# Patient Record
Sex: Male | Born: 1990
Health system: Southern US, Community
[De-identification: ages and names within clinical notes are randomized; demographics above are authoritative.]

## PROBLEM LIST (undated history)

## (undated) DIAGNOSIS — W3400XA Accidental discharge from unspecified firearms or gun, initial encounter: Secondary | ICD-10-CM

## (undated) DIAGNOSIS — L0292 Furuncle, unspecified: Secondary | ICD-10-CM

---

## 2016-04-18 ENCOUNTER — Encounter: Payer: Self-pay | Admitting: Pediatric Intensive Care

## 2016-04-18 DIAGNOSIS — Z5900 Homelessness unspecified: Secondary | ICD-10-CM

## 2016-05-10 NOTE — Congregational Nurse Program (Signed)
Congregational Nurse Program Note  Date of Encounter: 04/18/2016  Past Medical History: No past medical history on file.  Encounter Details:     CNP Questionnaire - 04/18/16 1125      Patient Demographics   Is this a new or existing patient? New   Patient is considered a/an Not Applicable   Race African-American/Black     Patient Assistance   Location of Patient Assistance [facility]   Patient's financial/insurance status Low Income;[place] [facility]/Care Connects   Uninsured Patient Yes   Interventions Not Applicable   Patient referred to apply for the following financial assistance Not Applicable   Food insecurities addressed Not Applicable   Transportation assistance No   Assistance securing medications No   Educational health offerings Navigating the healthcare system     Encounter Details   Primary purpose of visit Navigating the Healthcare System   Was an Emergency Department visit averted? Not Applicable   Does patient have a medical provider? Yes   Patient referred to Not Applicable   Was a mental health screening completed? (GAINS tool) No   Does patient have dental issues? No   Does patient have vision issues? No   Does your patient have an abnormal blood pressure today? No   Since previous encounter, have you referred patient for abnormal blood pressure that resulted in a new diagnosis or medication change? No   Does your patient have an abnormal blood glucose today? No   Since previous encounter, have you referred patient for abnormal blood glucose that resulted in a new diagnosis or medication change? No   Was there a life-saving intervention made? No     Client requests BP check.

## 2017-01-13 ENCOUNTER — Emergency Department (HOSPITAL_COMMUNITY)
Admission: EM | Admit: 2017-01-13 | Discharge: 2017-01-13 | Disposition: A | Discharge status: Home / Self Care | Payer: Self-pay | Attending: Emergency Medicine | Admitting: Emergency Medicine

## 2017-01-13 ENCOUNTER — Encounter (HOSPITAL_COMMUNITY): Payer: Self-pay | Admitting: Emergency Medicine

## 2017-01-13 ENCOUNTER — Ambulatory Visit (HOSPITAL_COMMUNITY): Admission: EM | Admit: 2017-01-13 | Discharge: 2017-01-13 | Discharge status: Against Medical Advice | Payer: Self-pay

## 2017-01-13 DIAGNOSIS — F17200 Nicotine dependence, unspecified, uncomplicated: Secondary | ICD-10-CM | POA: Insufficient documentation

## 2017-01-13 DIAGNOSIS — R369 Urethral discharge, unspecified: Secondary | ICD-10-CM

## 2017-01-13 DIAGNOSIS — R300 Dysuria: Secondary | ICD-10-CM | POA: Insufficient documentation

## 2017-01-13 LAB — URINALYSIS, ROUTINE W REFLEX MICROSCOPIC
Bilirubin Urine: NEGATIVE
GLUCOSE, UA: NEGATIVE mg/dL
Ketones, ur: NEGATIVE mg/dL
NITRITE: NEGATIVE
PROTEIN: NEGATIVE mg/dL
SPECIFIC GRAVITY, URINE: 1.025 (ref 1.005–1.030)
pH: 5 (ref 5.0–8.0)

## 2017-01-13 MED ORDER — LIDOCAINE HCL (PF) 1 % IJ SOLN
5.0000 mL | Freq: Once | INTRAMUSCULAR | Status: AC
  Administered 2017-01-13: 5 mL
  Filled 2017-01-13: qty 5

## 2017-01-13 MED ORDER — AZITHROMYCIN 250 MG PO TABS
1000.0000 mg | ORAL_TABLET | Freq: Once | ORAL | Status: AC
  Administered 2017-01-13: 1000 mg via ORAL
  Filled 2017-01-13: qty 4

## 2017-01-13 MED ORDER — CEFTRIAXONE SODIUM 250 MG IJ SOLR
250.0000 mg | Freq: Once | INTRAMUSCULAR | Status: AC
  Administered 2017-01-13: 250 mg via INTRAMUSCULAR
  Filled 2017-01-13: qty 250
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
## 2017-01-13 NOTE — Discharge Instructions (Signed)
You have been seen today in the Emergency Department (ED) for penis pain and discharge. Your workup today shows that you had one or more sexually transmitted infections (STIs).  You have been treated with a one time dose medication for both of these conditions.  Please have your partner tested for STD's and do not resume sexual activity until you and your partner have confirmed negative test results or have both been treated.  Please follow up with your doctor as soon as possible regarding today?s ED visit and your symptoms.   Return to the ED if your pain worsens, you develop a fever, or for any other symptoms that concern you.

## 2017-01-13 NOTE — ED Triage Notes (Signed)
Pt c/o penile discharge, yellow x 2 days. +dysuria. Lower abd pain began 2 days ago.

## 2017-01-15 LAB — GC/CHLAMYDIA PROBE AMP (~~LOC~~) NOT AT ARMC
Chlamydia: NEGATIVE
Neisseria Gonorrhea: POSITIVE — AB

## 2017-01-22 ENCOUNTER — Emergency Department (HOSPITAL_COMMUNITY)
Admission: EM | Admit: 2017-01-22 | Discharge: 2017-01-22 | Disposition: A | Discharge status: Home / Self Care | Payer: Self-pay | Attending: Emergency Medicine | Admitting: Emergency Medicine

## 2017-01-22 DIAGNOSIS — Y929 Unspecified place or not applicable: Secondary | ICD-10-CM | POA: Insufficient documentation

## 2017-01-22 DIAGNOSIS — F17200 Nicotine dependence, unspecified, uncomplicated: Secondary | ICD-10-CM | POA: Insufficient documentation

## 2017-01-22 DIAGNOSIS — Y939 Activity, unspecified: Secondary | ICD-10-CM | POA: Insufficient documentation

## 2017-01-22 DIAGNOSIS — Y999 Unspecified external cause status: Secondary | ICD-10-CM | POA: Insufficient documentation

## 2017-01-22 DIAGNOSIS — S0181XA Laceration without foreign body of other part of head, initial encounter: Secondary | ICD-10-CM | POA: Insufficient documentation

## 2017-01-22 MED ORDER — LIDOCAINE-EPINEPHRINE (PF) 2 %-1:200000 IJ SOLN: 20.0000 mL | Freq: Once | INTRAMUSCULAR | Status: DC

## 2017-01-22 MED ORDER — BACITRACIN ZINC 500 UNIT/GM EX OINT: TOPICAL_OINTMENT | Freq: Two times a day (BID) | CUTANEOUS | Status: DC

## 2017-01-22 NOTE — ED Notes (Signed)
PA at the bedside.
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
## 2017-01-22 NOTE — Discharge Instructions (Signed)
If you develop any vision changes, headache, vomiting please return to the ED.   WOUND CARE Please have your stitches/staples removed in 3-5 or sooner if you have concerns. You may do this at any available urgent care or at your primary care doctor's office.  Keep area clean and dry for 24 hours. Do not remove bandage, if applied.  After 24 hours, remove bandage and wash wound gently with mild soap and warm water. Reapply a new bandage after cleaning wound, if directed.  Continue daily cleansing with soap and water until stitches/staples are removed.  Do not apply any ointments or creams to the wound while stitches/staples are in place, as this may cause delayed healing.  Seek medical careif you experience any of the following signs of infection: Swelling, redness, pus drainage, streaking, fever >101.0 F  Seek care if you experience excessive bleeding that does not stop after 15-20 minutes of constant, firm pressure.
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
## 2017-01-29 ENCOUNTER — Emergency Department (HOSPITAL_COMMUNITY)
Admission: EM | Admit: 2017-01-29 | Discharge: 2017-01-30 | Disposition: A | Discharge status: Other Acute Inpatient Hospital | Payer: Self-pay | Attending: Emergency Medicine | Admitting: Emergency Medicine

## 2017-01-29 ENCOUNTER — Encounter (HOSPITAL_COMMUNITY): Payer: Self-pay

## 2017-01-29 DIAGNOSIS — F1410 Cocaine abuse, uncomplicated: Secondary | ICD-10-CM | POA: Insufficient documentation

## 2017-01-29 DIAGNOSIS — F17200 Nicotine dependence, unspecified, uncomplicated: Secondary | ICD-10-CM | POA: Insufficient documentation

## 2017-01-29 DIAGNOSIS — F1210 Cannabis abuse, uncomplicated: Secondary | ICD-10-CM | POA: Insufficient documentation

## 2017-01-29 DIAGNOSIS — R45851 Suicidal ideations: Secondary | ICD-10-CM

## 2017-01-29 DIAGNOSIS — F329 Major depressive disorder, single episode, unspecified: Secondary | ICD-10-CM | POA: Insufficient documentation

## 2017-01-29 LAB — COMPREHENSIVE METABOLIC PANEL
ALBUMIN: 4 g/dL (ref 3.5–5.0)
ALT: 12 U/L — ABNORMAL LOW (ref 17–63)
AST: 25 U/L (ref 15–41)
Alkaline Phosphatase: 69 U/L (ref 38–126)
Anion gap: 8 (ref 5–15)
BUN: 9 mg/dL (ref 6–20)
CO2: 28 mmol/L (ref 22–32)
Calcium: 9.4 mg/dL (ref 8.9–10.3)
Chloride: 102 mmol/L (ref 101–111)
Creatinine, Ser: 1.09 mg/dL (ref 0.61–1.24)
GFR calc Af Amer: >60 mL/min (ref >60)
GFR calc non Af Amer: >60 mL/min (ref >60)
GLUCOSE: 71 mg/dL (ref 65–99)
POTASSIUM: 4 mmol/L (ref 3.5–5.1)
SODIUM: 138 mmol/L (ref 135–145)
TOTAL PROTEIN: 6.7 g/dL (ref 6.5–8.1)
Total Bilirubin: 1.2 mg/dL (ref 0.3–1.2)

## 2017-01-29 LAB — CBC
HEMATOCRIT: 47.2 % (ref 39.0–52.0)
HEMOGLOBIN: 16.1 g/dL (ref 13.0–17.0)
MCH: 31.9 pg (ref 26.0–34.0)
MCHC: 34.1 g/dL (ref 30.0–36.0)
MCV: 93.7 fL (ref 78.0–100.0)
Platelets: 275 10*3/uL (ref 150–400)
RBC: 5.04 MIL/uL (ref 4.22–5.81)
RDW: 13.2 % (ref 11.5–15.5)
WBC: 8.5 10*3/uL (ref 4.0–10.5)

## 2017-01-29 LAB — SALICYLATE LEVEL

## 2017-01-29 LAB — RAPID URINE DRUG SCREEN, HOSP PERFORMED
Amphetamines: NOT DETECTED
BENZODIAZEPINES: NOT DETECTED
Barbiturates: NOT DETECTED
COCAINE: POSITIVE — AB
OPIATES: NOT DETECTED
Tetrahydrocannabinol: POSITIVE — AB

## 2017-01-29 LAB — ACETAMINOPHEN LEVEL

## 2017-01-29 LAB — ETHANOL: Alcohol, Ethyl (B): <5 mg/dL (ref <5)

## 2017-01-29 MED ORDER — ACETAMINOPHEN 325 MG PO TABS: 650.0000 mg | ORAL_TABLET | ORAL | Status: DC | PRN

## 2017-01-29 MED ORDER — ONDANSETRON HCL 4 MG PO TABS
4.0000 mg | ORAL_TABLET | Freq: Three times a day (TID) | ORAL | Status: DC | PRN
Start: 2017-01-29 — End: 2017-01-30

## 2017-01-29 MED ORDER — IBUPROFEN 400 MG PO TABS: 600.0000 mg | ORAL_TABLET | Freq: Three times a day (TID) | ORAL | Status: DC | PRN

## 2017-01-29 NOTE — BH Assessment (Addendum)
Tele Assessment Note   Daniel [name] is an 26 y.o. male who presents to the ED voluntarily. Pt reports he has been using drugs daily for the past several weeks and states he has been feeling suicidal and hopeless. Pt denies that he has a current plan and states he has never attempted suicide in the past but he often thinks about it. Pt reports the last time he "thought about it was today."   Pt reports he has been using cocaine and marijuana since the age of 26 "off and on" but states he recently began using drugs everyday. Pt reports his mother passed away in 2005 and his grandmother recently passed away. Pt stated his grandmother "meant a lot to him." Pt's speech was slurred and at times inaudible throughout the assessment. Pt stated "I'm sorry. I'm just tired. I haven't been sleeping." [facility] and stated he hears "voices just talking." Pt states this occurs "when he is not high." Pt reports he feels that he does not have a strong support system but states he has "good friends who are positive influences."  Per Donell SievertSpencer Simon, PA pt meets criteria for inpt treatment.   Diagnosis: Cocaine Use D/O; Cannabis Use D/O; Substance Induced Mood D/O  Past Medical History: History reviewed. No pertinent past medical history.  History reviewed. No pertinent surgical history.  Family History: No family history on file.  Social History:  reports that he has been smoking.  He has never used smokeless tobacco. He reports that he drinks alcohol. He reports that he uses drugs, including Marijuana and Cocaine.  Additional Social History:  Alcohol / Drug Use Pain Medications: See MAR Prescriptions: See MAR Over the Counter: See MAR History of alcohol / drug use?: Yes Substance #1 Name of Substance 1: Cocaine 1 - Age of First Use: 15 1 - Amount (size/oz): pt stated "a lot" 1 - Frequency: daily 1 - Duration: ongoing 1 - Last Use / Amount: 01/28/17 Substance #2 Name of Substance 2:  Marijuana  2 - Age of First Use: 15 2 - Amount (size/oz): "a lot" 2 - Frequency: daily 2 - Duration: ongoing 2 - Last Use / Amount: 01/28/17  CIWA: CIWA-Ar BP: 137/66 Pulse Rate: 75 COWS:    PATIENT STRENGTHS: (choose at least two) Average or above average intelligence Capable of independent living Communication skills Motivation for treatment/growth  Allergies: No Known Allergies  Home Medications:  (Not in a hospital admission)  OB/GYN Status:  No LMP for male patient.  General Assessment Data [facility] Assessment: In system Is this a Tele or Face-to-Face Assessment?: Tele Assessment Is this an Initial Assessment or a Re-assessment for this encounter?: Initial Assessment Marital status: Single Is patient pregnant?: No Pregnancy Status: No Living Arrangements: Alone Can pt return to current living arrangement?: Yes Admission Status: Voluntary Is patient capable of signing voluntary admission?: Yes Referral Source: Self/Family/Friend Insurance type: none     Crisis Care Plan Living Arrangements: Alone Name of Psychiatrist: none Name of Therapist: none  Education Status Is patient currently in school?: No Highest grade of school patient has completed: GED  Risk to self with the past 6 months Suicidal Ideation: Yes-Currently Present Has patient been a risk to self within the past 6 months prior to admission? : No Suicidal Intent: No Has patient had any suicidal intent within the past 6 months prior to admission? : No Is patient at risk for suicide?: Yes Suicidal Plan?: No Has patient had  any suicidal plan within the past 6 months prior to admission? : No Access to Means: No What has been your use of drugs/alcohol within the last 12 months?: reports to daily cocaine and marijuana use  Previous Attempts/Gestures: No Triggers for Past Attempts: None known Intentional Self Injurious Behavior: None Family Suicide History: No Recent  stressful life event(s): Other (Comment), Loss (Comment) (increased SA, grandmother died, mother died) Persecutory voices/beliefs?: No Depression: Yes Depression Symptoms: Insomnia, Guilt, Loss of interest in usual pleasures, Feeling worthless/self pity, Feeling angry/irritable Substance abuse history and/or treatment for substance abuse?: Yes Suicide prevention information given to non-admitted patients: Not applicable  Risk to Others within the past 6 months Homicidal Ideation: No Does patient have any lifetime risk of violence toward others beyond the six months prior to admission? : No Thoughts of Harm to Others: No Current Homicidal Intent: No Current Homicidal Plan: No Access to Homicidal Means: No History of harm to others?: No Assessment of Violence: None Noted Does patient have access to weapons?: No Criminal Charges Pending?: No Does patient have a court date: No Is patient on probation?: No  Psychosis Hallucinations: Auditory Delusions: None noted  Mental Status Report Appearance/Hygiene: In scrubs Eye Contact: Poor Motor Activity: Freedom of movement Speech: Logical/coherent, Soft Level of Consciousness: Quiet/awake Mood: Depressed, Guilty, Helpless Affect: Depressed Anxiety Level: Minimal Thought Processes: Relevant, Coherent Judgement: Partial Orientation: Person, Place, Situation, Time, Appropriate for developmental age Obsessive Compulsive Thoughts/Behaviors: None  Cognitive Functioning Concentration: Normal Memory: Recent Intact, Remote Intact IQ: Average Insight: Fair Impulse Control: Fair Appetite: Good Sleep: Decreased Total Hours of Sleep: 4 Vegetative Symptoms: None  ADLScreening [facility] Assessment Services) Patient's cognitive ability adequate to safely complete daily activities?: Yes Patient able to express need for assistance with ADLs?: Yes Independently performs ADLs?: Yes (appropriate for developmental age)  Prior Inpatient  Therapy Prior Inpatient Therapy: No  Prior Outpatient Therapy Prior Outpatient Therapy: No Does patient have an ACCT team?: No Does patient have Intensive In-House Services?  : No Does patient have [facility] services? : No Does patient have P4CC services?: No  ADL Screening (condition at time of admission) Patient's cognitive ability adequate to safely complete daily activities?: Yes Is the patient deaf or have difficulty hearing?: No Does the patient have difficulty seeing, even when wearing glasses/contacts?: No Does the patient have difficulty concentrating, remembering, or making decisions?: No Patient able to express need for assistance with ADLs?: Yes Does the patient have difficulty dressing or bathing?: No Independently performs ADLs?: Yes (appropriate for developmental age) Does the patient have difficulty walking or climbing stairs?: No Weakness of Legs: None Weakness of Arms/Hands: None  Home Assistive Devices/Equipment Home Assistive Devices/Equipment: None    Abuse/Neglect Assessment (Assessment to be complete while patient is alone) Physical Abuse: Denies Verbal Abuse: Denies Sexual Abuse: Denies Exploitation of patient/patient's resources: Denies Self-Neglect: Denies     Merchant navy officer (For Healthcare) Does Patient Have a Medical Advance Directive?: No Would patient like information on creating a medical advance directive?: No - Patient declined    Additional Information 1:1 In Past 12 Months?: No CIRT Risk: No Elopement Risk: No Does patient have medical clearance?: Yes     Disposition:  Disposition Initial Assessment Completed for this Encounter: Yes Disposition of Patient: Inpatient treatment program Type of inpatient treatment program: Adult (per Donell Sievert, PA)  [name] 01/29/2017 9:38 PM

## 2017-01-29 NOTE — ED Provider Notes (Signed)
[facility]-EMERGENCY DEPT Provider Note   CSN: [id number] Arrival date & time: 01/29/17  1431   By signing my name below, I, Daniel [name], attest that this documentation has been prepared under the direction and in the presence of Daniel [name], Daniel Bonitoana Duo, MD. Electronically signed, Daniel [name], ED Scribe. 01/29/17. 6:14 PM.   History   Chief Complaint Chief Complaint  Patient presents with  . Suicidal   The history is provided by the patient and medical records. No language interpreter was used.    Daniel [name] is a 26 y.o. male with h/o polysubstance abuse presenting to the Emergency Department concerning suicidal ideations x > 1 week. He reports plan to strangle himself. Friend present in triage stated the pt expressed SI today via text message. Pt also expresses concern for abuse of marijuana and cocaine as well as occasional molly and meth use. He states he has used these drugs intermittently x "a few years". He states he last used marijuana and alcohol last night. Pt states he drinks a few beers daily. He states he does not have withdrawals when he does not drink alcohol. No h/o detox or psych hospitalizations. He states [facility] his [facility] after neglecting to attend school. No hallucinations, HI, cough, diarrhea, headache or fever. No other complaints at this time.   History reviewed. No pertinent past medical history.  There are no active problems to display for this patient.   History reviewed. No pertinent surgical history.     Home Medications    Prior to Admission medications   Not on File    Family History No family history on file.  Social History Social History  Substance Use Topics  . Smoking status: Current Every Day Smoker  . Smokeless tobacco: Never Used  . Alcohol use Yes     Comment: occ     Allergies   Patient has no known allergies.   Review of Systems Review of Systems  Constitutional: Negative for fever.  Respiratory:  Negative for cough.   Neurological: Negative for headaches.  Psychiatric/Behavioral: Positive for suicidal ideas. Negative for hallucinations.  All other systems reviewed and are negative.    Physical Exam Updated Vital Signs BP (!) 138/92 (BP Location: Right Arm)   Pulse 73   Temp 97.9 F (36.6 C) (Oral)   Resp 16   Ht 5\' 11"  (1.803 m)   Wt 155 lb (70.3 kg)   SpO2 100%   BMI 21.62 kg/m   Physical Exam Physical Exam  Nursing note and vitals reviewed. Constitutional: Well developed, well nourished, non-toxic, and in no acute distress Head: Normocephalic and atraumatic.  Mouth/Throat: Oropharynx is clear and moist.  Neck: Normal range of motion. Neck supple.  Cardiovascular: Normal rate and regular rhythm.   Pulmonary/Chest: Effort normal and breath sounds normal.  Abdominal: Soft. There is no tenderness. There is no rebound and no guarding.  Musculoskeletal: Normal range of motion.  Neurological: Alert, no facial droop, fluent speech, moves all extremities symmetrically Skin: Skin is warm and dry.  Psychiatric: Cooperative   ED Treatments / Results  DIAGNOSTIC STUDIES: Oxygen Saturation is 100% on RA, NL by my interpretation.    COORDINATION OF CARE: 5:58 PM-Discussed next steps with pt. Pt verbalized understanding and is agreeable with the plan. Pt prepared for behavioral health evaluation.    Labs (all labs ordered are listed, but only abnormal results are displayed) Labs Reviewed  COMPREHENSIVE METABOLIC PANEL - Abnormal; Notable for the following:  Result Value   ALT 12 (*)    All other components within normal limits  ACETAMINOPHEN LEVEL - Abnormal; Notable for the following:    Acetaminophen (Tylenol), Serum <10 (*)    All other components within normal limits  RAPID URINE DRUG SCREEN, HOSP PERFORMED - Abnormal; Notable for the following:    Cocaine POSITIVE (*)    Tetrahydrocannabinol POSITIVE (*)    All other components within normal limits  ETHANOL   SALICYLATE LEVEL  CBC    EKG  EKG Interpretation None       Radiology No results found.  Procedures Procedures (including critical care time)  Medications Ordered in ED Medications - No data to display   Initial Impression / Assessment and Plan / ED Course  I have reviewed the triage vital signs and the nursing notes.  Pertinent labs & imaging results that were available during my care of the patient were reviewed by me and considered in my medical decision making (see chart for details).     26 year old male who presents with polysubstance abuse and suicidal ideation with plan of pain himself. Is well-appearing and in no acute distress. Exam otherwise nonfocal. Blood work reassuring. He is medically cleared for TTS consult.  Final Clinical Impressions(s) / ED Diagnoses   Final diagnoses:  Suicidal ideation    New Prescriptions New Prescriptions   No medications on file   I personally performed the services described in this documentation, which was scribed in my presence. The recorded information has been reviewed and is accurate.    Lavera Guise, MD 01/29/17 [facility] [place]

## 2017-01-29 NOTE — ED Notes (Signed)
Patient's father left with all of patient's belongings

## 2017-01-29 NOTE — ED Notes (Signed)
Dr. Liu at bedside 

## 2017-01-29 NOTE — Progress Notes (Signed)
Pt meets criteria for inpt treatment. TTS to seek placement. Darel HongJudy notified and states she will inform Katie, RN who is currently in with another pt. EDP Shawn Joy also notified of disposition.   Princess BruinsAquicha Telsa Dillavou, MSW, LCSWA TTS Specialist [phone]

## 2017-01-29 NOTE — ED Notes (Signed)
Patient family will taken belongings home when he leave.

## 2017-01-29 NOTE — ED Notes (Signed)
TTS called to notify staff that [facility]

## 2017-01-29 NOTE — ED Triage Notes (Signed)
PT reports he has been abusing drugs for several years and needs help getting off of the drugs. Pt admits to abusing cocaine, marijuana, and occasionally has used meth and molly. PT friend with him in triage and states pt texted him today contemplating suicide. PT admits to having thoughts of wanting to harm himself x several weeks. No plan. Pt denies HI, VH, or AH. PT a&ox 4 in triage

## 2017-01-29 NOTE — Progress Notes (Signed)
[facility] Pt able to be transported at 12am. Accepting provider is Donell SievertSpencer Simon, [place]. Attending provider will be Dr. Elna BreslowEappen, MD. Call to report [phone]. Florentina AddisonKatie, RN notified.   Princess BruinsAquicha Evi Mccomb, MSW, LCSWA TTS Specialist [phone]

## 2017-01-30 ENCOUNTER — Inpatient Hospital Stay (HOSPITAL_COMMUNITY)
Admission: EM | Admit: 2017-01-30 | Discharge: 2017-02-02 | DRG: 885 | Disposition: A | Discharge status: Home / Self Care | Payer: No Typology Code available for payment source | Source: Intra-hospital | Attending: Psychiatry | Admitting: Psychiatry

## 2017-01-30 ENCOUNTER — Encounter (HOSPITAL_COMMUNITY): Payer: Self-pay | Admitting: *Deleted

## 2017-01-30 DIAGNOSIS — F1420 Cocaine dependence, uncomplicated: Secondary | ICD-10-CM | POA: Diagnosis present

## 2017-01-30 DIAGNOSIS — F411 Generalized anxiety disorder: Secondary | ICD-10-CM | POA: Diagnosis present

## 2017-01-30 DIAGNOSIS — F1220 Cannabis dependence, uncomplicated: Secondary | ICD-10-CM | POA: Clinically undetermined

## 2017-01-30 DIAGNOSIS — R45851 Suicidal ideations: Secondary | ICD-10-CM | POA: Diagnosis present

## 2017-01-30 DIAGNOSIS — Z9114 Patient's other noncompliance with medication regimen: Secondary | ICD-10-CM | POA: Diagnosis not present

## 2017-01-30 DIAGNOSIS — F17210 Nicotine dependence, cigarettes, uncomplicated: Secondary | ICD-10-CM | POA: Diagnosis present

## 2017-01-30 DIAGNOSIS — F3164 Bipolar disorder, current episode mixed, severe, with psychotic features: Principal | ICD-10-CM | POA: Diagnosis present

## 2017-01-30 DIAGNOSIS — Z813 Family history of other psychoactive substance abuse and dependence: Secondary | ICD-10-CM | POA: Diagnosis not present

## 2017-01-30 DIAGNOSIS — G4700 Insomnia, unspecified: Secondary | ICD-10-CM | POA: Diagnosis present

## 2017-01-30 DIAGNOSIS — F3163 Bipolar disorder, current episode mixed, severe, without psychotic features: Secondary | ICD-10-CM | POA: Diagnosis present

## 2017-01-30 DIAGNOSIS — F319 Bipolar disorder, unspecified: Secondary | ICD-10-CM | POA: Diagnosis present

## 2017-01-30 DIAGNOSIS — F1994 Other psychoactive substance use, unspecified with psychoactive substance-induced mood disorder: Secondary | ICD-10-CM | POA: Diagnosis present

## 2017-01-30 DIAGNOSIS — F39 Unspecified mood [affective] disorder: Secondary | ICD-10-CM

## 2017-01-30 DIAGNOSIS — F419 Anxiety disorder, unspecified: Secondary | ICD-10-CM

## 2017-01-30 MED ORDER — HYDROXYZINE HCL 25 MG PO TABS: 25.0000 mg | ORAL_TABLET | Freq: Four times a day (QID) | ORAL | Status: DC | PRN

## 2017-01-30 MED ORDER — DIVALPROEX SODIUM 250 MG PO DR TAB
250.0000 mg | DELAYED_RELEASE_TABLET | Freq: Three times a day (TID) | ORAL | Status: DC
  Administered 2017-01-30 – 2017-02-01 (×6): 250 mg via ORAL
  Filled 2017-01-30 (×12): qty 1

## 2017-01-30 MED ORDER — NICOTINE 14 MG/24HR TD PT24
14.0000 mg | MEDICATED_PATCH | Freq: Every day | TRANSDERMAL | Status: DC
  Administered 2017-01-31 – 2017-02-02 (×2): 14 mg via TRANSDERMAL
  Filled 2017-01-30 (×6): qty 1

## 2017-01-30 MED ORDER — MAGNESIUM HYDROXIDE 400 MG/5ML PO SUSP
30.0000 mL | Freq: Every day | ORAL | Status: DC | PRN
  Administered 2017-01-30: 30 mL via ORAL
  Filled 2017-01-30: qty 30

## 2017-01-30 MED ORDER — ALUM & MAG HYDROXIDE-SIMETH 200-200-20 MG/5ML PO SUSP: 30.0000 mL | ORAL | Status: DC | PRN

## 2017-01-30 MED ORDER — QUETIAPINE FUMARATE 50 MG PO TABS
50.0000 mg | ORAL_TABLET | Freq: Every evening | ORAL | Status: DC | PRN
  Administered 2017-01-30: 50 mg via ORAL
  Filled 2017-01-30 (×4): qty 1

## 2017-01-30 MED ORDER — ENSURE ENLIVE PO LIQD
237.0000 mL | Freq: Two times a day (BID) | ORAL | Status: DC
  Administered 2017-01-30 – 2017-02-02 (×5): 237 mL via ORAL

## 2017-01-30 MED ORDER — ACETAMINOPHEN 325 MG PO TABS: 650.0000 mg | ORAL_TABLET | Freq: Four times a day (QID) | ORAL | Status: DC | PRN

## 2017-01-30 MED ORDER — QUETIAPINE FUMARATE 100 MG PO TABS
100.0000 mg | ORAL_TABLET | Freq: Every day | ORAL | Status: DC
  Administered 2017-01-30 – 2017-02-01 (×3): 100 mg via ORAL
  Filled 2017-01-30 (×5): qty 1

## 2017-01-30 NOTE — Progress Notes (Signed)
Recreation Therapy Notes  Animal-Assisted Activity (AAA) Program Checklist/Progress Notes Patient Eligibility Criteria Checklist & Daily Group note for Rec TxIntervention  Date: 06.26.2018 Time: 2:45pm [address]    AAA/T Program Assumption of Risk Form signed by Patient/ or Parent Legal Guardian Yes  Patient is free of allergies or sever asthma Yes  Patient reports no fear of animals Yes  Patient reports no history of cruelty to animals Yes  Patient understands his/her participation is voluntary Yes  Behavioral Response: Did not attend.     Clinical Observations/Feedback: Patient discussed with MD for appropriateness in pet therapy session. Both LRT and MD agree patient is appropriate for participation. Patient offered participation in session and signed necessary consent form without issue. Patient approached in his room and was observed to be sleeping when approached by LRT. Patient responded appropriately to name and woke to sign consent form. By the time session started patient had returned to sleep and did not wake to interact with therapy dog.   Marykay Lexenise L Miki Labuda, LRT/CTRS         Aalyah Mansouri L 01/30/2017 3:12 PM

## 2017-01-30 NOTE — [facility] Suicide Risk Assessment (Signed)
[facility]   Nursing information obtained from:    Demographic factors:    Current Mental Status:    Loss Factors:    Historical Factors:    Risk Reduction Factors:     Total Time spent with patient: 20 minutes Principal Problem: Bipolar disorder, curr episode mixed, severe, with psychotic features (HCC) Diagnosis:   Patient Active Problem List   Diagnosis Date Noted  . Bipolar disorder, curr episode mixed, severe, with psychotic features (HCC) [F31.64] 01/30/2017  . Cocaine use disorder, moderate, dependence (HCC) [F14.20] 01/30/2017  . Cannabis use disorder, severe, dependence (HCC) [F12.20] 01/30/2017   Subjective Data: Please see H&P.   Continued Clinical Symptoms:  Alcohol Use Disorder Identification Test Final Score (AUDIT): 11 The "Alcohol Use Disorders Identification Test", Guidelines for Use in Primary Care, Second Edition.  World Science writerHealth Organization [facility](WHO). Score between 0-7:  no or low risk or alcohol related problems. Score between 8-15:  moderate risk of alcohol related problems. Score between 16-19:  high risk of alcohol related problems. Score 20 or above:  warrants further diagnostic evaluation for alcohol dependence and treatment.   CLINICAL FACTORS:   Bipolar Disorder:   Mixed State Depression:   Comorbid alcohol abuse/dependence   Musculoskeletal: Strength & Muscle Tone: within normal limits Gait & Station: normal Patient leans: N/A  Psychiatric Specialty Exam: Physical Exam  ROS  Blood pressure (!) 139/96, pulse 73, temperature 98.2 F (36.8 C), temperature source Oral, resp. rate 18, height 6' (1.829 m), weight 68.5 kg (151 lb), SpO2 100 %.Body mass index is 20.48 kg/m.  Please see H&P.                                                         COGNITIVE FEATURES THAT CONTRIBUTE TO RISK:  Closed-mindedness, Polarized thinking and Thought constriction (tunnel vision)    SUICIDE RISK:   Moderate:  Frequent suicidal ideation with limited intensity, and duration, some specificity in terms of plans, no associated intent, good self-control, limited dysphoria/symptomatology, some risk factors present, and identifiable protective factors, including available and accessible social support.  PLAN OF CARE: Please see H&P.   I certify that inpatient services furnished can reasonably be expected to improve the patient's condition.   Kamren Heintzelman, MD 01/30/2017, 1:22 PM

## 2017-01-30 NOTE — ED Notes (Signed)
[facility] arrived to take pt. Pt belongings were taken with father. Pt ambulated with a steady gait.

## 2017-01-30 NOTE — H&P (Signed)
Psychiatric Admission Assessment Adult  Patient Identification: Daniel [name] MRN:  [id number] Date of Evaluation:  01/30/2017 Chief Complaint: Patient states " I am suicidal.'   Principal Diagnosis: Bipolar disorder, curr episode mixed, severe, with psychotic features ([place]) Diagnosis:   Patient Active Problem List   Diagnosis Date Noted  . Bipolar disorder, curr episode mixed, severe, with psychotic features ([place]) [F31.64] 01/30/2017  . Cocaine use disorder, moderate, dependence ([place]) [F14.20] 01/30/2017  . Cannabis use disorder, severe, dependence ([place]) [F12.20] 01/30/2017   History of Present Illness: Daniel [name] is a 58 y old AAM, who is employed, lives in [place] by self , single , has a hx of substance abuse and mood swings, presented voluntarily with worsening SI .  Patient seen and chart reviewed.Discussed patient with treatment team. Pt today seen in bed , is depressed, reports continued SI , contracts for safety on the unit. Pt reports a hx of mood swings. Pt reports he has been feeling depressed, sadness all day , SI of and of , sleep issues and anhedonia lately. However , he has had episodes of mania , that has lasted in the past for 3-4 days or more . He has felt very energetic , impulsive , risk taking behaviors , abuses drugs as well as goes without sleep those nights. Pt reports that his previous provider in [place] told him he were bipolar . He however denies being on a mood stabilizer. Reports that he was on xanax in the past for a few days.  Pt reports hx of  Anxiety sx. Reports he worries about things in general and has racing heart rates and restlessness often.  Pt reports that he has AH - tells him things that he cannot describe. He has it on and off.  Pt reports abusing cocaine - since past 3 yrs - 2-3 gms per day , cannabis - 3-4 gms per day since the past 3 yrs or so . He reports he uses alcohol occasionally - few beers per day.  Pt reports his substance abuse has gotten  worse and he feels suicidal since he wants to get better.   Associated Signs/Symptoms: Depression Symptoms:  depressed mood, anhedonia, insomnia, psychomotor agitation, fatigue, feelings of worthlessness/guilt, difficulty concentrating, hopelessness, anxiety, (Hypo) Manic Symptoms:  Distractibility, Elevated Mood, Hallucinations, Impulsivity, Irritable Mood, Labiality of Mood, Anxiety Symptoms:  Excessive Worry, Psychotic Symptoms:  Hallucinations: Auditory PTSD Symptoms: Negative Total Time spent with patient: 45 minutes  Past Psychiatric History: Pt reports hx of being diagnosed with anxiety, bipolar ? In the past , used to be on xanax in the past. Denies past IP admissions. Reports suicide attempt by strangling a week ago.  Is the patient at risk to self? Yes.    Has the patient been a risk to self in the past 6 months? Yes.    Has the patient been a risk to self within the distant past? No.  Is the patient a risk to others? No.  Has the patient been a risk to others in the past 6 months? No.  Has the patient been a risk to others within the distant past? No.   Prior Inpatient Therapy:   Prior Outpatient Therapy:    Alcohol Screening: 1. How often do you have a drink containing alcohol?: 4 or more times a week 2. How many drinks containing alcohol do you have on a typical day when you are drinking?: 5 or 6 3. How often do you have six or more drinks on one occasion?:  Daily or almost daily Preliminary Score: 6 4. How often during the last year have you found that you were not able to stop drinking once you had started?: Less than monthly 5. How often during the last year have you failed to do what was normally expected from you becasue of drinking?: Never 6. How often during the last year have you needed a first drink in the morning to get yourself going after a heavy drinking session?: Never 7. How often during the last year have you had a feeling of guilt of remorse  after drinking?: Never 8. How often during the last year have you been unable to remember what happened the night before because you had been drinking?: Never 9. Have you or someone else been injured as a result of your drinking?: No 10. Has a relative or friend or a doctor or another health worker been concerned about your drinking or suggested you cut down?: No Alcohol Use Disorder Identification Test Final Score (AUDIT): 11 Brief Intervention: Yes Substance Abuse History in the last 12 months:  Yes.  cocaine, cannabis as noted in HP above Consequences of Substance Abuse: Medical Consequences:  recent IP admission Legal Consequences:  hx of legal issues Previous Psychotropic Medications: Yes , xanax Psychological Evaluations: Yes  Past Medical History: denies hx of htn, cardiac disease , thyroid disease. Family History: denies hx of HTN thyroid do , heart disease Family Psychiatric  History: mother is a drug abuser Tobacco Screening: Have you used any form of tobacco in the last 30 days? (Cigarettes, Smokeless Tobacco, Cigars, and/or Pipes): Yes Tobacco use, Select all that apply: 5 or more cigarettes per day Are you interested in Tobacco Cessation Medications?: Yes, will notify MD for an order Counseled patient on smoking cessation including recognizing danger situations, developing coping skills and basic information about quitting provided: Refused/Declined practical counseling Social History: single , employed in a Psychiatrist , takes Firefighter for welding course , raised by grand mother who had custody , mother used to be a drug abuser , dad was never around , has 3 sisters and 1 brother .reports hx of legal issues for robbery in the past, denies pending charges . History  Alcohol Use  . Yes    Comment: occ     History  Drug Use  . Types: Marijuana, Cocaine    Additional Social History: Marital status: Single Does patient have children?: No    Pain Medications:  denies Prescriptions: denies Over the Counter: denies History of alcohol / drug use?: Yes Negative Consequences of Use: Personal relationships, Work / Youth worker, Museum/gallery curator Name of Substance 1: cocaine 1 - Age of First Use: 15 1 - Amount (size/oz): 2 grams to 3 grams 1 - Frequency: daily 1 - Duration: on going Name of Substance 2: Marijuana 2 - Age of First Use: 15 2 - Amount (size/oz): varies 2 - Frequency: daily  2 - Duration: on going                Allergies:  No Known Allergies Lab Results:  Results for orders placed or performed during the hospital encounter of 01/29/17 (from the past 48 hour(s))  Comprehensive metabolic panel     Status: Abnormal   Collection Time: 01/29/17  5:03 PM  Result Value Ref Range   Sodium 138 135 - 145 mmol/L   Potassium 4.0 3.5 - 5.1 mmol/L   Chloride 102 101 - 111 mmol/L   CO2 28 22 - 32 mmol/L  Glucose, Bld 71 65 - 99 mg/dL   BUN 9 6 - 20 mg/dL   Creatinine, Ser 1.09 0.61 - 1.24 mg/dL   Calcium 9.4 8.9 - 10.3 mg/dL   Total Protein 6.7 6.5 - 8.1 g/dL   Albumin 4.0 3.5 - 5.0 g/dL   AST 25 15 - 41 U/L   ALT 12 (L) 17 - 63 U/L   Alkaline Phosphatase 69 38 - 126 U/L   Total Bilirubin 1.2 0.3 - 1.2 mg/dL   GFR calc non Af Amer >60 >60 mL/min   GFR calc Af Amer >60 >60 mL/min    Comment: (NOTE) The eGFR has been calculated using the CKD EPI equation. This calculation has not been validated in all clinical situations. eGFR's persistently <60 mL/min signify possible Chronic Kidney Disease.    Anion gap 8 5 - 15  Ethanol     Status: None   Collection Time: 01/29/17  5:03 PM  Result Value Ref Range   Alcohol, Ethyl (B) <5 <5 mg/dL    Comment:        LOWEST DETECTABLE LIMIT FOR SERUM ALCOHOL IS 5 mg/dL FOR MEDICAL PURPOSES ONLY   Salicylate level     Status: None   Collection Time: 01/29/17  5:03 PM  Result Value Ref Range   Salicylate Lvl <6.1 2.8 - 30.0 mg/dL  Acetaminophen level     Status: Abnormal   Collection Time: 01/29/17   5:03 PM  Result Value Ref Range   Acetaminophen (Tylenol), Serum <10 (L) 10 - 30 ug/mL    Comment:        THERAPEUTIC CONCENTRATIONS VARY SIGNIFICANTLY. A RANGE OF 10-30 ug/mL MAY BE AN EFFECTIVE CONCENTRATION FOR MANY PATIENTS. HOWEVER, SOME ARE BEST TREATED AT CONCENTRATIONS OUTSIDE THIS RANGE. ACETAMINOPHEN CONCENTRATIONS >150 ug/mL AT 4 HOURS AFTER INGESTION AND >50 ug/mL AT 12 HOURS AFTER INGESTION ARE OFTEN ASSOCIATED WITH TOXIC REACTIONS.   cbc     Status: None   Collection Time: 01/29/17  5:03 PM  Result Value Ref Range   WBC 8.5 4.0 - 10.5 K/uL   RBC 5.04 4.22 - 5.81 MIL/uL   Hemoglobin 16.1 13.0 - 17.0 g/dL   HCT 47.2 39.0 - 52.0 %   MCV 93.7 78.0 - 100.0 fL   MCH 31.9 26.0 - 34.0 pg   MCHC 34.1 30.0 - 36.0 g/dL   RDW 13.2 11.5 - 15.5 %   Platelets 275 150 - 400 K/uL  Rapid urine drug screen (hospital performed)     Status: Abnormal   Collection Time: 01/29/17  5:14 PM  Result Value Ref Range   Opiates NONE DETECTED NONE DETECTED   Cocaine POSITIVE (A) NONE DETECTED   Benzodiazepines NONE DETECTED NONE DETECTED   Amphetamines NONE DETECTED NONE DETECTED   Tetrahydrocannabinol POSITIVE (A) NONE DETECTED   Barbiturates NONE DETECTED NONE DETECTED    Comment:        DRUG SCREEN FOR MEDICAL PURPOSES ONLY.  IF CONFIRMATION IS NEEDED FOR ANY PURPOSE, NOTIFY LAB WITHIN 5 DAYS.        LOWEST DETECTABLE LIMITS FOR URINE DRUG SCREEN Drug Class       Cutoff (ng/mL) Amphetamine      1000 Barbiturate      200 Benzodiazepine   443 Tricyclics       154 Opiates          300 Cocaine          300 THC  50     Blood Alcohol level:  Lab Results  Component Value Date   ETH <5 80/10/4915    Metabolic Disorder Labs:  No results found for: HGBA1C, MPG No results found for: PROLACTIN No results found for: CHOL, TRIG, HDL, CHOLHDL, VLDL, LDLCALC  Current Medications: Current Facility-Administered Medications  Medication Dose Route Frequency Provider  Last Rate Last Dose  . acetaminophen (TYLENOL) tablet 650 mg  650 mg Oral Q6H PRN Patriciaann Clan E, PA-C      . alum & mag hydroxide-simeth (MAALOX/MYLANTA) 200-200-20 MG/5ML suspension 30 mL  30 mL Oral Q4H PRN Patriciaann Clan E, PA-C      . divalproex (DEPAKOTE) DR tablet 250 mg  250 mg Oral Q8H Teliah Buffalo, MD      . feeding supplement (ENSURE ENLIVE) (ENSURE ENLIVE) liquid 237 mL  237 mL Oral BID BM Chantalle Defilippo, MD      . hydrOXYzine (ATARAX/VISTARIL) tablet 25 mg  25 mg Oral Q6H PRN Patriciaann Clan E, PA-C      . magnesium hydroxide (MILK OF MAGNESIA) suspension 30 mL  30 mL Oral Daily PRN Patriciaann Clan E, PA-C      . nicotine (NICODERM CQ - dosed in mg/24 hours) patch 14 mg  14 mg Transdermal Daily Tonetta Napoles, MD      . QUEtiapine (SEROQUEL) tablet 100 mg  100 mg Oral QHS Beautiful Pensyl, MD       PTA Medications: No prescriptions prior to admission.    Musculoskeletal: Strength & Muscle Tone: within normal limits Gait & Station: normal Patient leans: N/A  Psychiatric Specialty Exam: Physical Exam  Review of Systems  Psychiatric/Behavioral: Positive for depression, hallucinations, substance abuse and suicidal ideas. The patient is nervous/anxious and has insomnia.   All other systems reviewed and are negative.   Blood pressure (!) 139/96, pulse 73, temperature 98.2 F (36.8 C), temperature source Oral, resp. rate 18, height 6' (1.829 m), weight 68.5 kg (151 lb), SpO2 100 %.Body mass index is 20.48 kg/m.  General Appearance: Disheveled  Eye Contact:  Fair  Speech:  Normal Rate  Volume:  Decreased  Mood:  Anxious and Depressed  Affect:  Congruent  Thought Process:  Goal Directed and Descriptions of Associations: Intact  Orientation:  Full (Time, Place, and Person)  Thought Content:  Hallucinations: Auditory and Rumination, unable to elaborate  Suicidal Thoughts:  Yes.  without intent/plan, recent attempt by starngling  Homicidal Thoughts:  No  Memory:   Immediate;   Fair Recent;   Fair Remote;   Fair  Judgement:  Intact  Insight:  Fair  Psychomotor Activity:  Normal  Concentration:  Concentration: Fair and Attention Span: Fair  Recall:  [employer] of Knowledge:  Fair  Language:  Fair  Akathisia:  No  Handed:  Right  AIMS (if indicated):     Assets:  Communication Skills Desire for Improvement  ADL's:  Intact  Cognition:  WNL  Sleep:  Number of Hours: 3.25    Treatment Plan Summary:Patient with mood swings , anxiety , substance abuse , recent SI as well as attempt , will benefit from treatment and referral to a substance abuse treatment program. Daily contact with patient to assess and evaluate symptoms and progress in treatment, Medication management and Plan see below Patient will benefit from inpatient treatment and stabilization.  Estimated length of stay is 5-7 days.  Reviewed past medical records,treatment plan.  Depakote DR 250 mg po q8h for mood swings. depakote level in 5 days.  Seroquel 100 mg po qhs for psychosis/augmenting the depakote . Referral to a substance abuse treatment program. Will continue to monitor vitals ,medication compliance and treatment side effects while patient is here.  Will monitor for medical issues as well as call consult as needed.  Reviewed labs cbc - wnl, cmp - wnl, uds- pos for cocaine, thc  ,will order tsh, lipid panel, hba1c, pl. Will get EKG for qtc monitoring. CSW will start working on disposition.  Patient to participate in therapeutic milieu .      Observation Level/Precautions:  15 minute checks    Psychotherapy:  Individual and group therapy     Consultations:  CSW  Discharge Concerns:  Stability and safety       Physician Treatment Plan for Primary Diagnosis: Bipolar disorder, curr episode mixed, severe, with psychotic features ([place]) Long Term Goal(s): Improvement in symptoms so as ready for discharge  Short Term Goals: Ability to verbalize feelings will improve,  Compliance with prescribed medications will improve and Ability to identify triggers associated with substance abuse/mental health issues will improve  Physician Treatment Plan for Secondary Diagnosis: Principal Problem:   Bipolar disorder, curr episode mixed, severe, with psychotic features ([place]) Active Problems:   Cocaine use disorder, moderate, dependence ([place])   Cannabis use disorder, severe, dependence ([place])  Long Term Goal(s): Improvement in symptoms so as ready for discharge  Short Term Goals: Ability to verbalize feelings will improve, Compliance with prescribed medications will improve and Ability to identify triggers associated with substance abuse/mental health issues will improve  I certify that inpatient services furnished can reasonably be expected to improve the patient's condition.    Ursula Alert, MD 6/26/20181:46 PM

## 2017-01-30 NOTE — [facility] Group Notes (Signed)
[facility] LCSW Group Therapy  01/30/2017 2:20 PM   Type of Therapy:  Group Therapy  Participation Level:  Active  Participation Quality:  Attentive  Affect:  Appropriate  Cognitive:  Appropriate  Insight:  Improving  Engagement in Therapy:  Engaged  Modes of Intervention:  Clarification, Education, Exploration and Socialization  Summary of Progress/Problems: Today's group focused on resilience.  Stayed the entire time, engaged throughout.  Talked about being incarcerated for 3 years, flunking his GED, then taking it again and passing.  Cited his family visiting him and waiting for him when he was released as what helped him be resilient. He has also adopted the mantra "I will not give up," and stated that he will not allow his past poor decisions to define himself.  Daryel Geraldorth, Alexie Lanni B 01/30/2017 , 2:20 PM

## 2017-01-30 NOTE — Progress Notes (Signed)
DAR NOTE: Pt present with flat affect and depressed mood in the unit. Pt has been isolating himself and has been bed most of the time. Pt denies physical pain, took all his meds as scheduled. As per self inventory, pt had a good night sleep, good appetite, normal energy, and good concentration. Pt rate depression at 0, hopeless ness at 0, and anxiety at 5. Pt's safety ensured with 15 minute and environmental checks. Pt currently denies SI/HI and A/V hallucinations. Pt verbally agrees to seek staff if SI/HI or A/VH occurs and to consult with staff before acting on these thoughts. Will continue POC.

## 2017-01-30 NOTE — Progress Notes (Signed)
Recreation Therapy Notes  Date: 01/30/17 Time: 1000 Location: 500 Hall Dayroom  Group Topic: Wellness  Goal Area(s) Addresses:  Patient will define components of whole wellness. Patient will verbalize benefit of whole wellness.  Intervention: Small beach ball, chairs  Activity: Seated Soccer.  Patients were seated in [name] semi circle.  Patients were to get the ball past the silver bar on the floor in order to gain [name] point.  LRT would at as the goalie and try to prevent patients from scoring the ball.  Patients had to score 30 points in order to win the game.  Education: Wellness, Discharge Planning.   Education Outcome: Acknowledges education/In group clarification offered/Needs additional education.   Clinical Observations/Feedback: Pt did not attend group.   Ramon Brant, LRT/CTRS         Daniel [name] 01/30/2017 12:01 PM 

## 2017-01-30 NOTE — [facility] Suicide Risk Assessment (Signed)
[facility] INPATIENT:  Family/Significant Other Suicide Prevention Education  Suicide Prevention Education:  Patient Refusal for Family/Significant Other Suicide Prevention Education: The patient Wende BushyShaquille Gryder has refused to provide written consent for family/significant other to be provided Family/Significant Other Suicide Prevention Education during admission and/or prior to discharge.  Physician notified.  Verdene LennertLauren C Mong Neal 01/30/2017, 2:07 PM

## 2017-01-30 NOTE — Progress Notes (Signed)
Adult Psychoeducational Group Note  Date:  01/30/2017 Time:  8:59 PM  Group Topic/Focus:  Wrap-Up Group:   The focus of this group is to help patients review their daily goal of treatment and discuss progress on daily workbooks.  Participation Level:  Minimal  Participation Quality:  Attentive  Affect:  Blunted  Cognitive:  Appropriate  Insight: Appropriate  Engagement in Group:  Engaged  Modes of Intervention:  Discussion  Additional Comments:  Pt stated his goal was to keep moving in a positive direction and to stay focused. Pt was encouraged to make her needs known to staff.  Caswell CorwinOwen, Carl Butner C 01/30/2017, 8:59 PM

## 2017-01-30 NOTE — Progress Notes (Signed)
Recreation Therapy Notes  INPATIENT RECREATION THERAPY ASSESSMENT  Patient Details Name: Daniel [name] MRN: [id number] DOB: 25-Dec-1990 Today's Date: 01/30/2017  Patient Stressors: Family  Pt stated he was here for substance abuse problems.  Coping Skills:   Isolate, Arguments, Substance Abuse, Avoidance, Exercise, Talking, Music, Sports  Personal Challenges: Anger, Communication, Decision-Making, Problem-Solving, Relationships, Substance Abuse, Trusting Others  Leisure Interests (2+):  Sports - Swimming, ConocoPhillipsature - Fishing, Technical brewerature - ArchitectHunting, Citigroupature - [place]ango[place] care, MetLifeCommunity - Other (Comment), Social - Friends, Garment/textile technologistCommunity - Shopping mall ([place])  Biochemist, clinicalAwareness of Community Resources:  Yes  Community Resources:  [place], [place]  Current Use: Yes  Patient Strengths:  Reading; Communicating  Patient Identified Areas of Improvement:  Anger; Problem solving  Current Recreation Participation:  1-2 times a week  Patient Goal for Hospitalization:  "To make better decisions, detox"  [place]  [place]idence[place]  Current [place] (including self-harm):  No  Current HI:  No  Consent to Intern Participation: N/A   Caroll RancherMarjette [name], LRT/CTRS  Caroll RancherLindsay, [name] 01/30/2017, 3:06 PM

## 2017-01-30 NOTE — Progress Notes (Signed)
Pt is a 26 year old [facility] SI with a plan to strangle himself.  He reports he is here for "substance abuse, cocaine, marijuana."  During admission assessment, pt denies SI/HI, denies hallucinations, denies pain.  He exhibits no withdrawal symptoms except for mild anxiety.  Presents with depressed affect and mood.  He reports he has been drinking "about 5 beers a day everyday."  Pt reports using "2 to 3 grams" of cocaine daily and smoking marijuana daily.  He reports stressors as: substance abuse, grandmother's death and mother's death.  Pt reports he was "ready to strangle myself" 2 days ago.  He attributes his SI to "my drug use."  Pt reports losing approximately 5 pounds in 2 weeks.  Admission process and paperwork completed with pt.  Non-invasive body assessment completed.  Pt has multiple tattoos, superficial abrasion to R knee, scar to R forearm.  Pt oriented to unit.  On-site provider placed admission orders and medication was administered to pt per order.  Encouraged pt to inform staff of needs and concerns.  Pt is cooperative with admission process.  He is compliant with scheduled medication.  Pt is safe on the unit and he verbally contracts for safety.  Will continue to monitor and assess.

## 2017-01-30 NOTE — Tx Team (Signed)
Initial Treatment Plan 01/30/2017 2:09 AM [name] [id number]    PATIENT STRESSORS: Loss of grandmother and mother Occupational concerns Substance abuse   PATIENT STRENGTHS: Average or above average intelligence Capable of independent living Communication skills General fund of knowledge Physical Health   PATIENT IDENTIFIED PROBLEMS: Substance abuse  SI    "trying to get this substance abuse under control"  "just to make me a better person"              DISCHARGE CRITERIA:  Improved stabilization in mood, thinking, and/or behavior Need for constant or close observation no longer present Withdrawal symptoms are absent or subacute and managed without 24-hour nursing intervention  PRELIMINARY DISCHARGE PLAN: Outpatient therapy Return to previous living arrangement  PATIENT/FAMILY INVOLVEMENT: This treatment plan has been presented to and reviewed with the patient, Daniel [name].  The patient and family have been given the opportunity to ask questions and make suggestions.  Arrie AranChurch, Morrell Fluke J, [place]RN 01/30/2017, 2:09 AM

## 2017-01-30 NOTE — Progress Notes (Signed)
   D: pt smiled when asked about his day. Stated, that his day was "alright". When asked if he was or has experienced A/V during his adm pt laughed. Stated, "I didn't ever...  And started mumbling". Pt complained of constipation. Pt has no other questions or concerns.    A: Pt given MOM for constipation.  Support and encouragement was offered. 15 min checks continued for safety.  R: Pt remains safe.

## 2017-01-30 NOTE — BHH Counselor (Signed)
Adult Comprehensive Assessment  Patient ID: Daniel [name], male   DOB: November 05, 1990, 26 y.o.   MRN: [id number]  Information Source: Information source: Patient  Current Stressors:  Educational / Learning stressors: None reported Employment / Job issues: None reported Family Relationships: his sisters abuse substances as well; reports none of his family is very close, especially since the death of his grandmother 54mo ago Surveyor, quantityinancial / Lack of resources (include bankruptcy): spending most of his money on cocaine Housing / Lack of housing: None reported Physical health (include injuries & life threatening diseases): None reported Social relationships: limited social support as he feels that he has negative people in his life Substance abuse: daily cocaine use Bereavement / Loss: grandmother died 54mo ago; mother is deceased  Living/Environment/Situation:  Living Arrangements: Alone Living conditions (as described by patient or guardian): safe and stable How long has patient lived in current situation?: 3459yr What is atmosphere in current home: Comfortable  Family History:  Marital status: Single Does patient have children?: No  Childhood History:  By whom was/is the patient raised?: Grandparents Description of patient's relationship with caregiver when they were a child: good relationship with grandmother Patient's description of current relationship with people who raised him/her: grandmother recently passed away Does patient have siblings?: Yes Number of Siblings: 3 Description of patient's current relationship with siblings: awkward relationship, not as close as they should be Did patient suffer any verbal/emotional/physical/sexual abuse as a child?: No Did patient suffer from severe childhood neglect?: No Has patient ever been sexually abused/assaulted/raped as an adolescent or adult?: No Was the patient ever a victim of a crime or a disaster?: No Witnessed domestic violence?:  No Has patient been effected by domestic violence as an adult?: No  Education:  Highest grade of school patient has completed: GED Currently a Consulting civil engineerstudent?: No Learning disability?: No  Employment/Work Situation:   Employment situation: Employed Where is patient currently employed?: [employer] How long has patient been employed?: 20mo Patient's job has been impacted by current illness: No What is the longest time patient has a held a job?: 2656yrs Where was the patient employed [place] that time[place] [place] Has patient ever been in the [employer] and Companymilitary?: No Has patient ever served in combat?: No Did You Receive Any Psychiatric Treatment/Services While in Equities traderthe Military?: No Are There Guns or Other Weapons in Your Home?: No  Financial Resources:   Financial resources: Income from employment, Private insurance Does patient have a representative payee or guardian?: No  Alcohol/Substance Abuse:   What has been your use of drugs/alcohol within the last 12 months?: daily cocaine use up to $600 a week; THC If attempted suicide, did drugs/alcohol play a role in this?: No Alcohol/Substance Abuse Treatment Hx: Denies past history Has alcohol/substance abuse ever caused legal problems?: No  Social Support System:   Conservation officer, natureatient's Community Support System: Fair Museum/gallery exhibitions officerDescribe Community Support System: has a good friend who is positive Type of faith/religion: Christian How does patient's faith help to cope with current illness?: believing in God gives Metallurgiststrength  Leisure/Recreation:   Leisure and Hobbies: fishing, shopping, hiking, rafting  Strengths/Needs:   What things does the patient do well?: read, communicating effectively, good with hands In what areas does patient struggle / problems for patient: nothing noted  Discharge Plan:   Does patient have access to transportation?: Yes Will patient be returning to same living situation after discharge?: Yes Currently receiving community mental  health services: No If no, would patient like referral for services when discharged?:  Yes (What county?) ([place]) Does patient have financial barriers related to discharge medications?: No  Summary/Recommendations:     Patient is a 26 year old male with a diagnosis of Cocaine Use Disorder and Bipolar Disorder, current episode mixed. Pt presented to the hospital with thoughts of suicide and increased depression. Pt reports primary trigger(s) for admission include worsening substance abuse and the death of his grandmother two months ago. Patient will benefit from crisis stabilization, medication evaluation, group therapy and psycho education in addition to case management for discharge planning. At discharge it is recommended that Pt remain compliant with established discharge plan and continued treatment.   Daniel [name]. 01/30/2017

## 2017-01-31 LAB — LIPID PANEL
CHOL/HDL RATIO: 2.3 ratio
CHOLESTEROL: 116 mg/dL (ref 0–200)
HDL: 50 mg/dL (ref >40)
LDL Cholesterol: 48 mg/dL (ref 0–99)
TRIGLYCERIDES: 89 mg/dL (ref <150)
VLDL: 18 mg/dL (ref 0–40)

## 2017-01-31 LAB — TSH: TSH: 1.225 u[IU]/mL (ref 0.350–4.500)

## 2017-01-31 NOTE — Tx Team (Signed)
Interdisciplinary Treatment and Diagnostic Plan Update  01/31/2017 Time of Session: 11:22 AM  Daniel [name] MRN: [id number]  Principal Diagnosis: Bipolar disorder, curr episode mixed, severe, with psychotic features ([place])  Secondary Diagnoses: Principal Problem:   Bipolar disorder, curr episode mixed, severe, with psychotic features ([place]) Active Problems:   Cocaine use disorder, moderate, dependence ([place])   Cannabis use disorder, severe, dependence ([place])   Current Medications:  Current Facility-Administered Medications  Medication Dose Route Frequency Provider Last Rate Last Dose  . acetaminophen (TYLENOL) tablet 650 mg  650 mg Oral Q6H PRN Laverle Hobby, PA-C      . alum & mag hydroxide-simeth (MAALOX/MYLANTA) 200-200-20 MG/5ML suspension 30 mL  30 mL Oral Q4H PRN Patriciaann Clan E, PA-C      . divalproex (DEPAKOTE) DR tablet 250 mg  250 mg Oral Q8H Eappen, Saramma, MD   250 mg at 01/30/17 2109  . feeding supplement (ENSURE ENLIVE) (ENSURE ENLIVE) liquid 237 mL  237 mL Oral BID BM Eappen, Saramma, MD   237 mL at 01/30/17 2112  . hydrOXYzine (ATARAX/VISTARIL) tablet 25 mg  25 mg Oral Q6H PRN Patriciaann Clan E, PA-C      . magnesium hydroxide (MILK OF MAGNESIA) suspension 30 mL  30 mL Oral Daily PRN Patriciaann Clan E, PA-C   30 mL at 01/30/17 2205  . nicotine (NICODERM CQ - dosed in mg/24 hours) patch 14 mg  14 mg Transdermal Daily Ursula Alert, MD   14 mg at 01/31/17 0723  . QUEtiapine (SEROQUEL) tablet 100 mg  100 mg Oral QHS Ursula Alert, MD   100 mg at 01/30/17 2110    PTA Medications: No prescriptions prior to admission.    Patient Stressors: Loss of grandmother and mother Occupational concerns Substance abuse  Patient Strengths: Average or above average intelligence Capable of independent living Communication skills General fund of knowledge Physical Health  Treatment Modalities: Medication Management, Group therapy, Case management,  1 to 1 session with  clinician, Psychoeducation, Recreational therapy.   Physician Treatment Plan for Primary Diagnosis: Bipolar disorder, curr episode mixed, severe, with psychotic features ([place]) Long Term Goal(s): Improvement in symptoms so as ready for discharge  Short Term Goals: Ability to verbalize feelings will improve Compliance with prescribed medications will improve Ability to identify triggers associated with substance abuse/mental health issues will improve Ability to verbalize feelings will improve Compliance with prescribed medications will improve Ability to identify triggers associated with substance abuse/mental health issues will improve  Medication Management: Evaluate patient's response, side effects, and tolerance of medication regimen.  Therapeutic Interventions: 1 to 1 sessions, Unit Group sessions and Medication administration.  Evaluation of Outcomes: Progressing  Physician Treatment Plan for Secondary Diagnosis: Principal Problem:   Bipolar disorder, curr episode mixed, severe, with psychotic features ([place]) Active Problems:   Cocaine use disorder, moderate, dependence ([place])   Cannabis use disorder, severe, dependence ([place])   Long Term Goal(s): Improvement in symptoms so as ready for discharge  Short Term Goals: Ability to verbalize feelings will improve Compliance with prescribed medications will improve Ability to identify triggers associated with substance abuse/mental health issues will improve Ability to verbalize feelings will improve Compliance with prescribed medications will improve Ability to identify triggers associated with substance abuse/mental health issues will improve  Medication Management: Evaluate patient's response, side effects, and tolerance of medication regimen.  Therapeutic Interventions: 1 to 1 sessions, Unit Group sessions and Medication administration.  Evaluation of Outcomes: Progressing   RN Treatment Plan for Primary Diagnosis: Bipolar  disorder, curr episode mixed, severe, with psychotic features ([place]) Long Term Goal(s): Knowledge of disease and therapeutic regimen to maintain health will improve  Short Term Goals: Ability to identify and develop effective coping behaviors will improve and Compliance with prescribed medications will improve  Medication Management: RN will administer medications as ordered by provider, will assess and evaluate patient's response and provide education to patient for prescribed medication. RN will report any adverse and/or side effects to prescribing provider.  Therapeutic Interventions: 1 on 1 counseling sessions, Psychoeducation, Medication administration, Evaluate responses to treatment, Monitor vital signs and CBGs as ordered, Perform/monitor CIWA, COWS, AIMS and Fall Risk screenings as ordered, Perform wound care treatments as ordered.  Evaluation of Outcomes: Progressing    Recreational Therapy Treatment Plan for Primary Diagnosis: Bipolar disorder, curr episode mixed, severe, with psychotic features ([place]) Long Term Goal(s): Patient will participate in recreation therapy treatment in at least 2 group sessions without prompting from LRT  Short Term Goals: Patient will be able to identify at least 5 coping skills for admitting diagnosis by conclusion of recreation therapy treatment  Treatment Modalities: Group and Pet Therapy  Therapeutic Interventions: Psychoeducation  Evaluation of Outcomes: Progressing   LCSW Treatment Plan for Primary Diagnosis: Bipolar disorder, curr episode mixed, severe, with psychotic features ([place]) Long Term Goal(s): Safe transition to appropriate next level of care at discharge, Engage patient in therapeutic group addressing interpersonal concerns.  Short Term Goals: Engage patient in aftercare planning with referrals and resources  Therapeutic Interventions: Assess for all discharge needs, 1 to 1 time with Social worker, Explore available resources and  support systems, Assess for adequacy in community support network, Educate family and significant other(s) on suicide prevention, Complete Psychosocial Assessment, Interpersonal group therapy.  Evaluation of Outcomes: Met  Return home, follow up [place] in Treatment: Attending groups: Yes Participating in groups: Yes Taking medication as prescribed: Yes Toleration medication: Yes, no side effects reported at this time Family/Significant other contact made: No Patient understands diagnosis: Yes AEB asking for help with depression and SI, as well as mood instability and psychosis Discussing patient identified problems/goals with staff: Yes Medical problems stabilized or resolved: Yes Denies suicidal/homicidal ideation: Yes Issues/concerns per patient self-inventory: None Other: N/A  New problem(s) identified: None identified at this time.   New Short Term/Long Term Goal(s): "I want help with staying clean and staying positive  Discharge Plan or Barriers:   Reason for Continuation of Hospitalization: Anxiety Depression Hallucinations Mania Medication stabilization Withdrawal symptoms  Estimated Length of Stay: 4 days  Attendees: Patient: Daniel [name] 01/31/2017  11:22 AM  Physician: Ursula Alert, MD 01/31/2017  11:22 AM  Nursing: Sena Hitch, RN 01/31/2017  11:22 AM  RN Care Manager: Lars Pinks, RN 01/31/2017  11:22 AM  Social Worker: Ripley Fraise 01/31/2017  11:22 AM  Recreational Therapist: Victorino Sparrow, LRT/CTRS 01/31/2017  11:22 AM  Other: Norberto Sorenson 01/31/2017  11:22 AM  Other:  01/31/2017  11:22 AM    Scribe for Treatment Team:  Roque Lias LCSW 01/31/2017 11:22 AM

## 2017-01-31 NOTE — [facility] Group Notes (Signed)
[facility] Group Notes:  (Counselor/Nursing/MHT/Case Management/Adjunct)  01/31/2017 1:15PM  Type of Therapy:  Group Therapy  Participation Level:  Active  Participation Quality:  Appropriate  Affect:  Flat  Cognitive:  Oriented  Insight:  Improving  Engagement in Group:  Limited  Engagement in Therapy:  Limited  Modes of Intervention:  Discussion, Exploration and Socialization  Summary of Progress/Problems: The topic for group was balance in life.  Pt participated in the discussion about when their life was in balance and out of balance and how this feels.  Pt discussed ways to get back in balance and short term goals they can work on to get where they want to be. Stayed until near the end, when he was asked to leave because he would not stop having a side conversation.  Talked about feeling balanced today know that he has "peace of mind.  I am upbeat, happy, joking."  This statement was incongruous with his presentation, which was more agitated, surly.   Daryel Geraldorth, Annalese Stiner B 01/31/2017 4:20 PM

## 2017-01-31 NOTE — Progress Notes (Signed)
Patient ID: Daniel [name], male   DOB: Nov 21, 1990, 26 y.o.   MRN: [id number]  Pt currently presents with a labile affect and hyperactive behavior. Visitor tonight, pt reports his uncle, was yelling at patient in the hallway saying "you are going to go to jail or something bad if you leave now!"  Pt reports to writer that their goal is to "go home, I have business to take care of." Pt states "I need to do what is good for me and not listen to what others say." Pt reports good sleep with current medication regimen.   Pt provided with medications per providers orders. Pt's labs and vitals were monitored throughout the night. Pt given a 1:1 about emotional and mental status. Pt supported and encouraged to express concerns and questions. Pt educated on medications. Pt signs a 72 hour form today.   Pt's safety ensured with 15 minute and environmental checks. Pt currently denies SI/HI and A/V hallucinations. Pt verbally agrees to seek staff if SI/HI or A/VH occurs and to consult with staff before acting on any harmful thoughts. Will continue POC.

## 2017-01-31 NOTE — Progress Notes (Signed)
Patient ID: Daniel [name], male   DOB: 03-24-1991, 26 y.o.   MRN: [id number]  Pt signed a 72 hour form today at 2012. Reports that he "has hustle" at home. Concerned about paying his bills now that he found out he has lost his job. Reports that he was here to "get away for a bit, stop using drugs for a minute" but that now he is ready to leave.

## 2017-01-31 NOTE — Progress Notes (Signed)
Recreation Therapy Notes  Date: 01/31/17 Time: 1000 Location: 500 Hall Dayroom  Group Topic: Leisure Education, Goal Setting  Goal Area(s) Addresses:  Patient will be able to identify at least 3 life goals.  Patient will be able to identify benefit of investing in life goals.  Patient will be able to identify benefit of setting life goals.   Intervention: Worksheet, pencils  Activity:  Life Goals.  Patients were given a worksheet with six categories (family, friends, work/school, spirituality, body and mental health).  Patients were to identify what they were good at, what they needed to improve and create a goal for each category.  Education:  Discharge Planning, Coping Skills, Life Goals   Education Outcome: Acknowledges Education/In Group Clarification Provided/Needs Additional Education  Clinical Observations:  Pt did not attend group.   Caroll RancherMarjette Zyire Eidson, LRT/CTRS         Caroll RancherLindsay, Srihith Aquilino A 01/31/2017 12:59 PM

## 2017-01-31 NOTE — Progress Notes (Signed)
Patient denies SI, HI and AVH.  Patient has been compliant with medications, attended groups, and engaged in other unit activities.  Patient has focused on discharge planning this shift and made several calls to plan his discharge.   Assess patient for safety, offer medications as prescribed, engage patient in 1:1 staff talks.   Continue to monitor as prescribed.

## 2017-01-31 NOTE — Progress Notes (Signed)
[facility] MD Progress Note  01/31/2017 2:42 PM Enzio Buchler  MRN:  [id number] Subjective:  Patient states " I am tired."  Objective:Patient seen and chart reviewed.Discussed patient with treatment team.  Pt seen in bed , withdrawn , reports that he continues to feel depressed and tired . Pt per RN , remains isolative . Pt has been tolerating medications well, denies Adrs. Continues to need encouragement and support.     Principal Problem: Bipolar disorder, curr episode mixed, severe, with psychotic features (HCC) Diagnosis:   Patient Active Problem List   Diagnosis Date Noted  . Bipolar disorder, curr episode mixed, severe, with psychotic features (HCC) [F31.64] 01/30/2017  . Cocaine use disorder, moderate, dependence (HCC) [F14.20] 01/30/2017  . Cannabis use disorder, severe, dependence (HCC) [F12.20] 01/30/2017   Total Time spent with patient: 25 minutes  Past Psychiatric History: Please see H&P.   Past Medical History: Please see H&P.  Family History:  Family History  Problem Relation Age of Onset  . Drug abuse Mother    Family Psychiatric  History: Please see H&P.  Social History:  History  Alcohol Use  . Yes    Comment: occ     History  Drug Use  . Types: Marijuana, Cocaine    Social History   Social History  . Marital status: Single    Spouse name: N/A  . Number of children: N/A  . Years of education: N/A   Social History Main Topics  . Smoking status: Current Every Day Smoker    Packs/day: 0.50    Types: Cigarettes  . Smokeless tobacco: Never Used  . Alcohol use Yes     Comment: occ  . Drug use: Yes    Types: Marijuana, Cocaine  . Sexual activity: Not Asked   Other Topics Concern  . None   Social History Narrative  . None   Additional Social History:    Pain Medications: denies Prescriptions: denies Over the Counter: denies History of alcohol / drug use?: Yes Negative Consequences of Use: Personal relationships, Work / Programmer, multimedia,  Surveyor, quantity Name of Substance 1: cocaine 1 - Age of First Use: 15 1 - Amount (size/oz): 2 grams to 3 grams 1 - Frequency: daily 1 - Duration: on going Name of Substance 2: Marijuana 2 - Age of First Use: 15 2 - Amount (size/oz): varies 2 - Frequency: daily  2 - Duration: on going                Sleep: Fair  Appetite:  Fair  Current Medications: Current Facility-Administered Medications  Medication Dose Route Frequency Provider Last Rate Last Dose  . acetaminophen (TYLENOL) tablet 650 mg  650 mg Oral Q6H PRN Kerry Hough, PA-C      . alum & mag hydroxide-simeth (MAALOX/MYLANTA) 200-200-20 MG/5ML suspension 30 mL  30 mL Oral Q4H PRN Donell Sievert E, PA-C      . divalproex (DEPAKOTE) DR tablet 250 mg  250 mg Oral Q8H Shantanu Strauch, MD   250 mg at 01/30/17 2109  . feeding supplement (ENSURE ENLIVE) (ENSURE ENLIVE) liquid 237 mL  237 mL Oral BID BM Donell Sliwinski, MD   237 mL at 01/30/17 2112  . hydrOXYzine (ATARAX/VISTARIL) tablet 25 mg  25 mg Oral Q6H PRN Donell Sievert E, PA-C      . magnesium hydroxide (MILK OF MAGNESIA) suspension 30 mL  30 mL Oral Daily PRN Donell Sievert E, PA-C   30 mL at 01/30/17 2205  . nicotine (NICODERM CQ - dosed  in mg/24 hours) patch 14 mg  14 mg Transdermal Daily Jomarie Longs, MD   14 mg at 01/31/17 0723  . QUEtiapine (SEROQUEL) tablet 100 mg  100 mg Oral QHS Vasil Juhasz, Levin Bacon, MD   100 mg at 01/30/17 2110    Lab Results:  Results for orders placed or performed during the hospital encounter of 01/30/17 (from the past 48 hour(s))  TSH     Status: None   Collection Time: 01/31/17  6:23 AM  Result Value Ref Range   TSH 1.225 0.350 - 4.500 uIU/mL    Comment: Performed by a 3rd Generation assay with a functional sensitivity of <=0.01 uIU/mL. Performed at [facility], [address], [place], [place] [address]   Lipid panel     Status: None   Collection Time: 01/31/17  6:23 AM  Result Value Ref Range   Cholesterol 116 0 -  200 mg/dL   Triglycerides 89 <604 mg/dL   HDL 50 >54 mg/dL   Total CHOL/HDL Ratio 2.3 RATIO   VLDL 18 0 - 40 mg/dL   LDL Cholesterol 48 0 - 99 mg/dL    Comment:        Total Cholesterol/HDL:CHD Risk Coronary Heart Disease Risk Table                     Men   Women  1/2 Average Risk   3.4   3.3  Average Risk       5.0   4.4  2 X Average Risk   9.6   7.1  3 X Average Risk  23.4   11.0        Use the calculated Patient Ratio above and the CHD Risk Table to determine the patient's CHD Risk.        ATP III CLASSIFICATION (LDL):  <100     mg/dL   Optimal  098-119  mg/dL   Near or Above                    Optimal  130-159  mg/dL   Borderline  147-829  mg/dL   High  >562     mg/dL   Very High Performed at [facility], [address], [place], [place] [address]     Blood Alcohol level:  Lab Results  Component Value Date   ETH <5 01/29/2017    Metabolic Disorder Labs: No results found for: HGBA1C, MPG No results found for: PROLACTIN Lab Results  Component Value Date   CHOL 116 01/31/2017   TRIG 89 01/31/2017   HDL 50 01/31/2017   CHOLHDL 2.3 01/31/2017   VLDL 18 01/31/2017   LDLCALC 48 01/31/2017    Physical Findings: AIMS: Facial and Oral Movements Muscles of Facial Expression: None, normal Lips and Perioral Area: None, normal Jaw: None, normal Tongue: None, normal,Extremity Movements Upper (arms, wrists, hands, fingers): None, normal Lower (legs, knees, ankles, toes): None, normal, Trunk Movements Neck, shoulders, hips: None, normal, Overall Severity Severity of abnormal movements (highest score from questions above): None, normal Incapacitation due to abnormal movements: None, normal Patient's awareness of abnormal movements (rate only patient's report): No Awareness, Dental Status Current problems with teeth and/or dentures?: No Does patient usually wear dentures?: No  CIWA:  CIWA-Ar Total: 1 COWS:  COWS Total Score: 1  Musculoskeletal: Strength &  Muscle Tone: within normal limits Gait & Station: normal Patient leans: N/A  Psychiatric Specialty Exam: Physical Exam  Nursing note and vitals  reviewed.   Review of Systems  Psychiatric/Behavioral: Positive for depression and substance abuse. The patient is nervous/anxious.   All other systems reviewed and are negative.   Blood pressure 132/73, pulse 79, temperature 97.5 F (36.4 C), temperature source Oral, resp. rate 16, height 6' (1.829 m), weight 68.5 kg (151 lb), SpO2 100 %.Body mass index is 20.48 kg/m.  General Appearance: Guarded  Eye Contact:  Poor  Speech:  Normal Rate  Volume:  Decreased  Mood:  Depressed  Affect:  Depressed  Thought Process:  Linear and Descriptions of Associations: Intact  Orientation:  Other:  self , situation  Thought Content:  Logical and Rumination  Suicidal Thoughts:  SI , improving  Homicidal Thoughts:  No  Memory:  Immediate;   Fair Recent;   Fair Remote;   Poor  Judgement:  Impaired  Insight:  Shallow  Psychomotor Activity:  Decreased  Concentration:  Concentration: Fair and Attention Span: Fair  Recall:  FiservFair  Fund of Knowledge:  Fair  Language:  Fair  Akathisia:  No  Handed:  Right  AIMS (if indicated):     Assets:  Desire for Improvement  ADL's:  Intact  Cognition:  WNL  Sleep:  Number of Hours: 6.5   Will continue today 01/31/17 plan as below except where it is noted.   Treatment Plan Summary:Patient with mood swings , anxiety , substance abuse - will benefit from continued stay. Daily contact with patient to assess and evaluate symptoms and progress in treatment, Medication management and Plan see below   Depakote DR 250 mg po q8 hr for mood swings. Depakote level - -02/04/2017 Continue Seroquel 100 mg po qhs for psychosis. Reviewed labs - lipid panel- wnl, tsh - wnl . EKG - qtc - wnl. Pt to be referred to substance abuse treatment program. CSW will continue to work on disposition.  Juanito Gonyer, MD 01/31/2017, 2:42  PM

## 2017-02-01 DIAGNOSIS — F29 Unspecified psychosis not due to a substance or known physiological condition: Secondary | ICD-10-CM

## 2017-02-01 DIAGNOSIS — Z813 Family history of other psychoactive substance abuse and dependence: Secondary | ICD-10-CM

## 2017-02-01 DIAGNOSIS — F17210 Nicotine dependence, cigarettes, uncomplicated: Secondary | ICD-10-CM

## 2017-02-01 LAB — PROLACTIN: PROLACTIN: 23.3 ng/mL — AB (ref 4.0–15.2)

## 2017-02-01 LAB — HEMOGLOBIN A1C
HEMOGLOBIN A1C: 5.4 % (ref 4.8–5.6)
Mean Plasma Glucose: 108 mg/dL

## 2017-02-01 MED ORDER — HYDROXYZINE HCL 25 MG PO TABS
25.0000 mg | ORAL_TABLET | Freq: Three times a day (TID) | ORAL | Status: DC
  Administered 2017-02-01 – 2017-02-02 (×3): 25 mg via ORAL
  Filled 2017-02-01 (×8): qty 1

## 2017-02-01 MED ORDER — DIVALPROEX SODIUM 500 MG PO DR TAB
500.0000 mg | DELAYED_RELEASE_TABLET | Freq: Two times a day (BID) | ORAL | Status: DC
  Administered 2017-02-02: 500 mg via ORAL
  Filled 2017-02-01 (×5): qty 1

## 2017-02-01 NOTE — Progress Notes (Signed)
[facility] MD Progress Note  02/01/2017 10:12 AM Daniel [name]  MRN:  [id number]   Subjective:  Patient states " I don't want to be disturbed milligrams sleeping, disturbed more frequently and am tired."  Objective: Patient seen and chart reviewed,  And case discussed with treatment team.  Patient was staying in bed and felt annoyed when I asked him to step out of his room saying that he has been repeatedly disturbed when he is trying to take rest. Patient reported he was not able to take Rest. Patient reportedly refusing his medication stating that his medication is not working continue to report anxiety and depression. Patient rated his anxiety as 8 out of 10 and is depression is 7 out of 10, 10 being the worst. Patient is willing to take hydroxyzine for anxiety and adjacent his medication Depakote for better control of his mood swings. Patient endorses noncompliant with medications since his Medicaid was discontinued and reportedly was seen a psychiatrist and [place]. Patient reportedly in [place] for the last 2 months working in [employer] but lost her job. Patient also pending valproic acid level in 02/04/2017.   Principal Problem: Bipolar disorder, curr episode mixed, severe, with psychotic features (HCC) Diagnosis:   Patient Active Problem List   Diagnosis Date Noted  . Bipolar disorder, curr episode mixed, severe, with psychotic features (HCC) [F31.64] 01/30/2017  . Cocaine use disorder, moderate, dependence (HCC) [F14.20] 01/30/2017  . Cannabis use disorder, severe, dependence (HCC) [F12.20] 01/30/2017   Total Time spent with patient: 25 minutes  Past Psychiatric History: Please see H&P.   Past Medical History: Please see H&P.  Family History:  Family History  Problem Relation Age of Onset  . Drug abuse Mother    Family Psychiatric  History: Please see H&P.  Social History:  History  Alcohol Use  . Yes    Comment: occ     History  Drug Use  .  Types: Marijuana, Cocaine    Social History   Social History  . Marital status: Single    Spouse name: N/A  . Number of children: N/A  . Years of education: N/A   Social History Main Topics  . Smoking status: Current Every Day Smoker    Packs/day: 0.50    Types: Cigarettes  . Smokeless tobacco: Never Used  . Alcohol use Yes     Comment: occ  . Drug use: Yes    Types: Marijuana, Cocaine  . Sexual activity: Not Asked   Other Topics Concern  . None   Social History Narrative  . None   Additional Social History:    Pain Medications: denies Prescriptions: denies Over the Counter: denies History of alcohol / drug use?: Yes Negative Consequences of Use: Personal relationships, Work / Programmer, multimediachool, Surveyor, quantityinancial Name of Substance 1: cocaine 1 - Age of First Use: 15 1 - Amount (size/oz): 2 grams to 3 grams 1 - Frequency: daily 1 - Duration: on going Name of Substance 2: Marijuana 2 - Age of First Use: 15 2 - Amount (size/oz): varies 2 - Frequency: daily  2 - Duration: on going                Sleep: Fair  Appetite:  Fair  Current Medications: Current Facility-Administered Medications  Medication Dose Route Frequency Provider Last Rate Last Dose  . acetaminophen (TYLENOL) tablet 650 mg  650 mg Oral Q6H PRN Kerry HoughSimon, Spencer E, PA-C      . alum & mag hydroxide-simeth (MAALOX/MYLANTA) 200-200-20 MG/5ML suspension  30 mL  30 mL Oral Q4H PRN Donell Sievert E, PA-C      . divalproex (DEPAKOTE) DR tablet 250 mg  250 mg Oral Q8H Eappen, Saramma, MD   250 mg at 02/01/17 0640  . feeding supplement (ENSURE ENLIVE) (ENSURE ENLIVE) liquid 237 mL  237 mL Oral BID BM Eappen, Saramma, MD   237 mL at 02/01/17 0842  . hydrOXYzine (ATARAX/VISTARIL) tablet 25 mg  25 mg Oral Q6H PRN Donell Sievert E, PA-C      . magnesium hydroxide (MILK OF MAGNESIA) suspension 30 mL  30 mL Oral Daily PRN Donell Sievert E, PA-C   30 mL at 01/30/17 2205  . nicotine (NICODERM CQ - dosed in mg/24 hours) patch 14 mg   14 mg Transdermal Daily Jomarie Longs, MD   14 mg at 01/31/17 0723  . QUEtiapine (SEROQUEL) tablet 100 mg  100 mg Oral QHS Jomarie Longs, MD   100 mg at 01/31/17 2122    Lab Results:  Results for orders placed or performed during the hospital encounter of 01/30/17 (from the past 48 hour(s))  TSH     Status: None   Collection Time: 01/31/17  6:23 AM  Result Value Ref Range   TSH 1.225 0.350 - 4.500 uIU/mL    Comment: Performed by a 3rd Generation assay with a functional sensitivity of <=0.01 uIU/mL. Performed at [facility], [address] [place], [place] [address]   Lipid panel     Status: None   Collection Time: 01/31/17  6:23 AM  Result Value Ref Range   Cholesterol 116 0 - 200 mg/dL   Triglycerides 89 <191 mg/dL   HDL 50 >47 mg/dL   Total CHOL/HDL Ratio 2.3 RATIO   VLDL 18 0 - 40 mg/dL   LDL Cholesterol 48 0 - 99 mg/dL    Comment:        Total Cholesterol/HDL:CHD Risk Coronary Heart Disease Risk Table                     Men   Women  1/2 Average Risk   3.4   3.3  Average Risk       5.0   4.4  2 X Average Risk   9.6   7.1  3 X Average Risk  23.4   11.0        Use the calculated Patient Ratio above and the CHD Risk Table to determine the patient's CHD Risk.        ATP III CLASSIFICATION (LDL):  <100     mg/dL   Optimal  829-562  mg/dL   Near or Above                    Optimal  130-159  mg/dL   Borderline  130-865  mg/dL   High  >784     mg/dL   Very High Performed at [facility], [address] [place], [place] [address]   Hemoglobin A1c     Status: None   Collection Time: 01/31/17  6:23 AM  Result Value Ref Range   Hgb A1c MFr Bld 5.4 4.8 - 5.6 %    Comment: (NOTE)         Pre-diabetes: 5.7 - 6.4         Diabetes: >6.4         Glycemic control for adults with diabetes: <7.0    Mean Plasma Glucose 108 mg/dL  Comment: (NOTE) Performed At: [facility] [address] [place], [place] [id number] Mila Homer MD  [id number] Performed at [facility], [address], [place], [place] [address]   Prolactin     Status: Abnormal   Collection Time: 01/31/17  6:23 AM  Result Value Ref Range   Prolactin 23.3 (H) 4.0 - 15.2 ng/mL    Comment: (NOTE) Performed At: [facility] [address] [place], [place] [id number] Mila Homer MD [id number] Performed at [facility], [address] [place], [place] [address]     Blood Alcohol level:  Lab Results  Component Value Date   ETH <5 01/29/2017    Metabolic Disorder Labs: Lab Results  Component Value Date   HGBA1C 5.4 01/31/2017   MPG 108 01/31/2017   Lab Results  Component Value Date   PROLACTIN 23.3 (H) 01/31/2017   Lab Results  Component Value Date   CHOL 116 01/31/2017   TRIG 89 01/31/2017   HDL 50 01/31/2017   CHOLHDL 2.3 01/31/2017   VLDL 18 01/31/2017   LDLCALC 48 01/31/2017    Physical Findings: AIMS: Facial and Oral Movements Muscles of Facial Expression: None, normal Lips and Perioral Area: None, normal Jaw: None, normal Tongue: None, normal,Extremity Movements Upper (arms, wrists, hands, fingers): None, normal Lower (legs, knees, ankles, toes): None, normal, Trunk Movements Neck, shoulders, hips: None, normal, Overall Severity Severity of abnormal movements (highest score from questions above): None, normal Incapacitation due to abnormal movements: None, normal Patient's awareness of abnormal movements (rate only patient's report): No Awareness, Dental Status Current problems with teeth and/or dentures?: No Does patient usually wear dentures?: No  CIWA:  CIWA-Ar Total: 1 COWS:  COWS Total Score: 1  Musculoskeletal: Strength & Muscle Tone: within normal limits Gait & Station: normal Patient leans: N/A  Psychiatric Specialty Exam: Physical Exam  Nursing note and vitals reviewed.   Review of Systems  Psychiatric/Behavioral: Positive for depression and  substance abuse. The patient is nervous/anxious.   All other systems reviewed and are negative.   Blood pressure (!) 128/98, pulse 71, temperature 98.2 F (36.8 C), temperature source Oral, resp. rate 18, height 6' (1.829 m), weight 68.5 kg (151 lb), SpO2 100 %.Body mass index is 20.48 kg/m.  General Appearance: Guarded  Eye Contact:  Fair  Speech:  Normal Rate  Volume:  Decreased  Mood:  Depressed and anxious  Affect:  Depressed  Thought Process:  Linear and Descriptions of Associations: Intact  Orientation:  Other:  self , situation  Thought Content:  Logical and Rumination  Suicidal Thoughts:  SI , improving  Homicidal Thoughts:  No  Memory:  Immediate;   Fair Recent;   Fair Remote;   Poor  Judgement:  Impaired  Insight:  Shallow  Psychomotor Activity:  Decreased  Concentration:  Concentration: Fair and Attention Span: Fair  Recall:  [employer] of Knowledge:  Fair  Language:  Fair  Akathisia:  No  Handed:  Right  AIMS (if indicated):     Assets:  Desire for Improvement  ADL's:  Intact  Cognition:  WNL  Sleep:  Number of Hours: 4.75   Will continue today 02/01/17 plan as below except where it is noted.  Treatment Plan Summary: Patient with mood swings , anxiety , substance abuse - will benefit from continued stay. Encouraged to participate in medication management, group sessions and personal care.   Daily contact with patient to assess and evaluate symptoms and progress in treatment,  Medication management and Plan see below Increase Depakote DR 500 mg po q12 hr for mood swings.  Pending Depakote level - -02/04/2017 Change hydroxyzine 25 mg 3 times daily for anxiety  Continue Seroquel 100 mg po qhs for psychosis. Reviewed labs - lipid panel- wnl, tsh - wnl, Prolactin level 23.3 and hemoglobin A1c 5.4 . EKG - qtc - wnl. Pt to be referred to substance abuse treatment program. CSW will continue to work on disposition.  Leata Mouse, MD 02/01/2017, 10:12  AM

## 2017-02-01 NOTE — Progress Notes (Signed)
Recreation Therapy Notes  Date: 02/01/17 Time: 1000 Location: 500 Hall Dayroom  Group Topic: Communication, Team Building, Problem Solving  Goal Area(s) Addresses:  Patient will effectively work with peer towards shared goal.  Patient will identify skill used to make activity successful.  Patient will identify how skills used during activity can be used to reach post d/c goals.   Intervention: STEM Activity   Activity: [employer] Tower. In teams, patients were asked to build the tallest freestanding tower possible out of 15 pipe cleaners. Systematically resources were removed, for example patient ability to use both hands and patient ability to verbally communicate.    Education: Pharmacist, communityocial Skills, Building control surveyorDischarge Planning.   Education Outcome: Acknowledges education/In group clarification offered/Needs additional education.   Clinical Observations/Feedback: Pt did not attend group.   Caroll RancherMarjette Nataya Bastedo, LRT/CTRS         Caroll RancherLindsay, Edgar Corrigan A 02/01/2017 11:33 AM

## 2017-02-01 NOTE — Progress Notes (Signed)
Adult Psychoeducational Group Note  Date:  02/01/2017 Time:  12:56 AM  Group Topic/Focus:  Wrap-Up Group:   The focus of this group is to help patients review their daily goal of treatment and discuss progress on daily workbooks.  Participation Level:  Active  Participation Quality:  Intrusive  Affect:  Labile  Cognitive:  Appropriate  Insight: Limited  Engagement in Group:  Engaged  Modes of Intervention:  Socialization and Support  Additional Comments:  Patient attended and participated in group tonight. He reports having a good day today. He went outside, watch television. His uncle visited with him today.  He went for his meals and attended his groups. He spoke with his counselor and doctor today.  Lita MainsFrancis, Anaka Beazer Smith [facility] 02/01/2017, 12:56 AM

## 2017-02-01 NOTE — [facility] Group Notes (Signed)
[facility]  02/01/2017 , 1:18 PM    Type of Therapy:  [facility] [employer] Presentation  Participation Level:  Active  Participation Quality:  Attentive  Affect:  Blunted  Cognitive:  Oriented  Insight:  Limited  Engagement in Therapy:  Engaged  Modes of Intervention:  Discussion, Education and Socialization  Summary of Progress/Problems:  Daniel [name] from [employer] came to present his recovery story and play the guitar.  Invited.  Stated he would not come.  Later joined us. Appeared preoccupied. Went in and out of the room several times. Daniel [name], Daniel [name] B 02/01/2017 , 1:18 PM

## 2017-02-01 NOTE — Progress Notes (Signed)
D: Pt presents with blunted affect and anxious mood. A & O X4. Denies SI, HI, AVH and pain. Rates his anxiety 5/10, hopelessness 0/10 and depression 0/10. Reports he's eating and sleeping well. "I want to leave now, this is not suppose to be a secret, I signed a 72 hr d/c request, I'm here voluntarily" in reference to his demands for d/c. Pt's goal for today is "staying clean".  A: Medications administered as prescribed and effects monitored. Support and availability provided. 72 hr d/c request was explained to pt in detail. Pt encouraged to voice concerns and to comply with treatment regimen. Q 15 minutes safety checks maintained without self harm gestures.  R: Pt receptive to care. Verbalized understanding related to 72 hr process. Compliant with medications when offered. Denies adverse drug reactions. Attended scheduled unit groups. Safety maintained on and off unit.

## 2017-02-02 MED ORDER — NICOTINE 14 MG/24HR TD PT24: 14.0000 mg | MEDICATED_PATCH | Freq: Every day | TRANSDERMAL | 0 refills | Status: DC

## 2017-02-02 MED ORDER — HYDROXYZINE HCL 25 MG PO TABS
25.0000 mg | ORAL_TABLET | Freq: Three times a day (TID) | ORAL | Status: DC
  Filled 2017-02-02 (×2): qty 10

## 2017-02-02 MED ORDER — DIVALPROEX SODIUM 500 MG PO DR TAB: 500.0000 mg | DELAYED_RELEASE_TABLET | Freq: Two times a day (BID) | ORAL | 0 refills | Status: DC

## 2017-02-02 MED ORDER — QUETIAPINE FUMARATE 100 MG PO TABS: 100.0000 mg | ORAL_TABLET | Freq: Every day | ORAL | 0 refills | Status: DC

## 2017-02-02 MED ORDER — HYDROXYZINE HCL 25 MG PO TABS: 25.0000 mg | ORAL_TABLET | Freq: Three times a day (TID) | ORAL | 0 refills | Status: DC

## 2017-02-02 MED ORDER — DIVALPROEX SODIUM 500 MG PO DR TAB
500.0000 mg | DELAYED_RELEASE_TABLET | Freq: Two times a day (BID) | ORAL | Status: DC
  Filled 2017-02-02 (×3): qty 14

## 2017-02-02 MED ORDER — QUETIAPINE FUMARATE 100 MG PO TABS
100.0000 mg | ORAL_TABLET | Freq: Every day | ORAL | Status: DC
  Filled 2017-02-02: qty 7

## 2017-02-02 NOTE — Progress Notes (Signed)
Patient discharged to lobby. Patient was stable and appreciative at that time. All papers, samples and prescriptions were given and valuables returned. Verbal understanding expressed. Denies SI/HI and A/VH. Patient given opportunity to express concerns and ask questions.  

## 2017-02-02 NOTE — Discharge Summary (Signed)
Physician Discharge Summary Note  Patient:  Daniel [name] is an 26 y.o., male MRN:  [id number] DOB:  [birth year] Patient phone:  [phone] (home)  [place]   [address] [place] [address],  Total Time spent with patient: 45 minutes  Date of Admission:  01/30/2017 Date of Discharge: 02/02/17  Reason for Admission:   Per H&P: "Osie CheeksShaquille is a 89 y old AAM, who is employed, [place] in [place] self , single , has a hx of substance abuse and mood swings, presented voluntarily with worsening SI. Patient seen and chart reviewed.Discussed patient with treatment team. Pt today seen in bed , is depressed, reports continued SI , contracts for safety on the unit. Pt reports a hx of mood swings. Pt reports he has been feeling depressed, sadness all day , SI of and of , sleep issues and anhedonia lately. However , he has had episodes of mania , that has lasted in the past for 3-4 days or more . He has felt very energetic , impulsive , risk taking behaviors , abuses drugs as well as goes without sleep those nights. Pt [place] were [place] . He however denies being on a mood stabilizer. Reports that he was on xanax in the past for a few days. Pt reports hx of  Anxiety sx. Reports he worries about things in general and has racing heart rates and restlessness often. Pt reports that he has AH - tells him things that he cannot describe. He has it on and off. Pt reports abusing cocaine - since past 3 yrs - 2-3 gms per day , cannabis - 3-4 gms per day since the past 3 yrs or so. He reports he uses alcohol occasionally - few beers per day. Pt reports his substance abuse has gotten worse and he feels suicidal since he wants to get better."  Principal Problem: Bipolar disorder, curr episode mixed, severe, with psychotic features [facility](HCC) Discharge Diagnoses: Patient Active Problem List   Diagnosis Date Noted  . Bipolar disorder, curr episode mixed, severe, with  psychotic features (HCC) [F31.64] 01/30/2017  . Cocaine use disorder, moderate, dependence (HCC) [F14.20] 01/30/2017  . Cannabis use disorder, severe, dependence (HCC) [F12.20] 01/30/2017    Past Psychiatric History: see H&P  Past Medical History: History reviewed. No pertinent past medical history. History reviewed. No pertinent surgical history. Family History:  Family History  Problem Relation Age of Onset  . Drug abuse Mother    Family Psychiatric  History: see H&P Social History:  History  Alcohol Use  . Yes    Comment: occ     History  Drug Use  . Types: Marijuana, Cocaine    Social History   Social History  . Marital status: Single    Spouse name: N/A  . Number of children: N/A  . Years of education: N/A   Social History Main Topics  . Smoking status: Current Every Day Smoker    Packs/day: 0.50    Types: Cigarettes  . Smokeless tobacco: Never Used  . Alcohol use Yes     Comment: occ  . Drug use: Yes    Types: Marijuana, Cocaine  . Sexual activity: Not Asked   Other Topics Concern  . None   Social History Narrative  . None    Hospital Course:   Daniel [name] was admitted for Bipolar disorder, curr episode mixed, severe, with psychotic features (HCC) , and crisis management.  Pt was treated discharged  with the medications listed below under Medication List.  Medical problems were identified and treated as needed.  Home medications were restarted as appropriate.  Improvement was monitored by observation and Daniel [name] 's daily report of symptom reduction.  Emotional and mental status was monitored by daily self-inventory reports completed by Daniel [name] and clinical staff.         Quillan Shahin was evaluated by the treatment team for stability and plans for continued recovery upon discharge. Freddrick Weatherly 's motivation was an integral factor for scheduling further treatment. Employment, transportation, bed availability, health  status, family support, and any pending legal issues were also considered during hospital stay. Pt was offered further treatment options upon discharge including but not limited to Residential, Intensive Outpatient, and Outpatient treatment.  Jerett Hevia will follow up with the services as listed below under Follow Up Information.     Upon completion of this admission the patient was both mentally and medically stable for discharge denying suicidal/homicidal ideation, auditory/visual/tactile hallucinations, delusional thoughts and paranoia.    Kenn Medal responded well to treatment with depakote, vistaril, nicotine, and seroquel  without adverse effects.  Pt demonstrated improvement without reported or observed adverse effects to the point of stability appropriate for outpatient management. Pertinent labs include: UDS+ coc/THC,  Prolactin 23.3H, for which outpatient follow-up is necessary for lab recheck as mentioned below. Reviewed CBC, CMP, BAL, and UDS; all unremarkable aside from noted exceptions.    Physical Findings: AIMS: Facial and Oral Movements Muscles of Facial Expression: None, normal Lips and Perioral Area: None, normal Jaw: None, normal Tongue: None, normal,Extremity Movements Upper (arms, wrists, hands, fingers): None, normal Lower (legs, knees, ankles, toes): None, normal, Trunk Movements Neck, shoulders, hips: None, normal, Overall Severity Severity of abnormal movements (highest score from questions above): None, normal Incapacitation due to abnormal movements: None, normal Patient's awareness of abnormal movements (rate only patient's report): No Awareness, Dental Status Current problems with teeth and/or dentures?: No Does patient usually wear dentures?: No  CIWA:  CIWA-Ar Total: 1 COWS:  COWS Total Score: 1  Musculoskeletal: Strength & Muscle Tone: within normal limits Gait & Station: normal Patient leans: N/A  Psychiatric Specialty Exam: Physical  Exam  Review of Systems  Psychiatric/Behavioral: Positive for depression and substance abuse (UDS+ coc/THC). Negative for hallucinations and suicidal ideas. The patient is nervous/anxious and has insomnia.   All other systems reviewed and are negative.   Blood pressure 140/90, pulse (!) 58, temperature 98 F (36.7 C), temperature source Oral, resp. rate 16, height 6' (1.829 m), weight 68.5 kg (151 lb), SpO2 100 %.Body mass index is 20.48 kg/m.  SEE MD PSE within SRA  Have you used any form of tobacco in the last 30 days? (Cigarettes, Smokeless Tobacco, Cigars, and/or Pipes): Yes  Has this patient used any form of tobacco in the last 30 days? (Cigarettes, Smokeless Tobacco, Cigars, and/or Pipes) Yes, Yes, A prescription for an FDA-approved tobacco cessation medication was offered at discharge and the patient refused  Blood Alcohol level:  Lab Results  Component Value Date   [facility] [place] [facility] <5 01/29/2017    Metabolic Disorder Labs:  Lab Results  Component Value Date   HGBA1C 5.4 01/31/2017   MPG 108 01/31/2017   Lab Results  Component Value Date   PROLACTIN 23.3 (H) 01/31/2017   Lab Results  Component Value Date   CHOL 116 01/31/2017   TRIG 89 01/31/2017   HDL 50 01/31/2017   CHOLHDL 2.3 01/31/2017   VLDL 18 01/31/2017  LDLCALC 48 01/31/2017    See Psychiatric Specialty Exam and Suicide Risk Assessment completed by Attending Physician prior to discharge.  Discharge destination:  Home  Is patient on multiple antipsychotic therapies at discharge:  No   Has Patient had three or more failed trials of antipsychotic monotherapy by history:  No  Recommended Plan for Multiple Antipsychotic Therapies: NA   Allergies as of 02/02/2017   No Known Allergies     Medication List    TAKE these medications     Indication  divalproex 500 MG DR tablet Commonly known as:  DEPAKOTE Take 1 tablet (500 mg total) by mouth every 12 (twelve) hours.  Indication:  mood stabilization    hydrOXYzine 25 MG tablet Commonly known as:  ATARAX/VISTARIL Take 1 tablet (25 mg total) by mouth 3 (three) times daily.  Indication:  Anxiety Neurosis   nicotine 14 mg/24hr patch Commonly known as:  NICODERM CQ - dosed in mg/24 hours Place 1 patch (14 mg total) onto the skin daily. Start taking on:  02/03/2017  Indication:  Nicotine Addiction   QUEtiapine 100 MG tablet Commonly known as:  SEROQUEL Take 1 tablet (100 mg total) by mouth at bedtime.  Indication:  mood stabilization      Follow-up Information    [facility] Follow up on 02/06/2017.   Specialty:  Behavioral Health Why:  at 3:00pm for your initial assessment with Dr. Cheyenne Adas. Please arrive 15 minutes early and bring your insurance card. Contact information: [address] [place] [address] [phone]           Follow-up recommendations:  Activity:  As tolerated Diet:  Heart healthy with low sodium.  Comments:   Take all medications as prescribed. Keep all follow-up appointments as scheduled.  Do not consume alcohol or use illegal drugs while on prescription medications. Report any adverse effects from your medications to your primary care provider promptly.  In the event of recurrent symptoms or worsening symptoms, call 911, a crisis hotline, or go to the nearest emergency department for evaluation.    Signed: Beau Fanny, FNP 02/02/2017, 9:57 AM

## 2017-02-02 NOTE — Plan of Care (Signed)
Problem: [facility] Participation in Recreation Therapeutic Interventions Goal: STG-Patient will identify at least five coping skills for ** STG: Coping Skills - Patient will be able to identify at least 5 coping skills for anger by conclusion of recreation therapy tx  Outcome: Not Met (add Reason) Pt did not attend coping skills recreation therapy session.   , LRT/CTRS   

## 2017-02-02 NOTE — Progress Notes (Signed)
Recreation Therapy Notes  Date: 02/02/17 Time: 1000 Location: 500 Hall Dayroom  Group Topic: Leisure Education  Goal Area(s) Addresses:  Patient will identify positive leisure activities.  Patient will identify one positive benefit of participation in leisure activities.   Behavioral Response: Minimal  Intervention: Markers, scissors, construction paper, tape/glue, magazines  Activity: Leisure PSA.  Patients were to create a public service announcement to explain the benefits of leisure.  Patients were to also show what kinds of activities can be done for leisure enjoyment.  Education:  Leisure Education, Building control surveyorDischarge Planning  Education Outcome: Acknowledges education/In group clarification offered/Needs additional education  Clinical Observations/Feedback: Pt stated leisure is anything you do to occupy your time like riding bikes.  Pt didn't complete activity. Pt was social with peers.  Pt had to be redirected from side conversations.   Caroll RancherMarjette Barbera Perritt, LRT/CTRS         Caroll RancherLindsay, Kael Keetch A 02/02/2017 12:36 PM

## 2017-02-02 NOTE — Progress Notes (Signed)
  [facility] :  Will you be returning to the same living situation after discharge:  Yes,  home At discharge, do you have transportation home?: Yes,  family Do you have the ability to pay for your medications: Yes,  insurance  Release of information consent forms completed and in the chart;  Patient's signature needed at discharge.  Patient to Follow up at: Follow-up Information    [facility] Follow up on 02/06/2017.   Specialty:  Behavioral Health Why:  at 3:00pm for your initial assessment with Dr. Cheyenne Adasinger. Please arrive 15 minutes early and bring your insurance card. Contact information: [address] [place] [address] [phone]           Next level of care provider has access to [facility] Link:no  Safety Planning and Suicide Prevention discussed: Yes,  yes  Have you used any form of tobacco in the last 30 days? (Cigarettes, Smokeless Tobacco, Cigars, and/or Pipes): Yes  Has patient been referred to the Quitline?: Patient refused referral  Patient has been referred for addiction treatment: Yes  Daniel [name] 02/02/2017, 10:47 AM

## 2017-02-02 NOTE — Progress Notes (Signed)
Pt reports that he is having a good evening.  Earlier in the day, he was upset that he was not being discharged today, but he is more relaxed and appropriate this evening.  He has spent most of the evening in the dayroom watching TV and talking with peers.  He is silly at times, but appropriate.  He denies SI/HI/AVH.  He did report that he spoke to his girlfriend earlier and she doesn't feel he is ready for discharge.  This made him upset, but Clinical research associatewriter was able to talk with him and help him understand that it was the doctor's assessment of him as to whether he would be discharged tomorrow. He makes his needs known to staff. Support and encouragement offered.  Discharge plans are in process.  Pt wants to return home when discharged.  Safety maintained with q15 minute checks.

## 2017-02-02 NOTE — Tx Team (Signed)
Interdisciplinary Treatment and Diagnostic Plan Update  02/02/2017 Time of Session: 10:45 AM  Daniel [name] MRN: [id number]  Principal Diagnosis: Bipolar disorder, curr episode mixed, severe, with psychotic features ([place])  Secondary Diagnoses: Principal Problem:   Bipolar disorder, curr episode mixed, severe, with psychotic features ([place]) Active Problems:   Cocaine use disorder, moderate, dependence ([place])   Cannabis use disorder, severe, dependence ([place])   Current Medications:  Current Facility-Administered Medications  Medication Dose Route Frequency Provider Last Rate Last Dose  . acetaminophen (TYLENOL) tablet 650 mg  650 mg Oral Q6H PRN Laverle Hobby, PA-C      . alum & mag hydroxide-simeth (MAALOX/MYLANTA) 200-200-20 MG/5ML suspension 30 mL  30 mL Oral Q4H PRN Patriciaann Clan E, PA-C      . divalproex (DEPAKOTE) DR tablet 500 mg  500 mg Oral Q12H Ambrose Finland, MD   500 mg at 02/02/17 0806  . feeding supplement (ENSURE ENLIVE) (ENSURE ENLIVE) liquid 237 mL  237 mL Oral BID BM Eappen, Saramma, MD   237 mL at 02/01/17 1409  . hydrOXYzine (ATARAX/VISTARIL) tablet 25 mg  25 mg Oral TID Ambrose Finland, MD   25 mg at 02/02/17 0806  . magnesium hydroxide (MILK OF MAGNESIA) suspension 30 mL  30 mL Oral Daily PRN Laverle Hobby, PA-C   30 mL at 01/30/17 2205  . nicotine (NICODERM CQ - dosed in mg/24 hours) patch 14 mg  14 mg Transdermal Daily Ursula Alert, MD   14 mg at 02/02/17 0806  . QUEtiapine (SEROQUEL) tablet 100 mg  100 mg Oral QHS Ursula Alert, MD   100 mg at 02/01/17 2115    PTA Medications: No prescriptions prior to admission.    Patient Stressors: Loss of grandmother and mother Occupational concerns Substance abuse  Patient Strengths: Average or above average intelligence Capable of independent living Communication skills General fund of knowledge Physical Health  Treatment Modalities: Medication Management, Group therapy, Case  management,  1 to 1 session with clinician, Psychoeducation, Recreational therapy.   Physician Treatment Plan for Primary Diagnosis: Bipolar disorder, curr episode mixed, severe, with psychotic features ([place]) Long Term Goal(s): Improvement in symptoms so as ready for discharge  Short Term Goals: Ability to verbalize feelings will improve Compliance with prescribed medications will improve Ability to identify triggers associated with substance abuse/mental health issues will improve Ability to verbalize feelings will improve Compliance with prescribed medications will improve Ability to identify triggers associated with substance abuse/mental health issues will improve  Medication Management: Evaluate patient's response, side effects, and tolerance of medication regimen.  Therapeutic Interventions: 1 to 1 sessions, Unit Group sessions and Medication administration.  Evaluation of Outcomes: Adequate for Discharge  Physician Treatment Plan for Secondary Diagnosis: Principal Problem:   Bipolar disorder, curr episode mixed, severe, with psychotic features ([place]) Active Problems:   Cocaine use disorder, moderate, dependence ([place])   Cannabis use disorder, severe, dependence ([place])   Long Term Goal(s): Improvement in symptoms so as ready for discharge  Short Term Goals: Ability to verbalize feelings will improve Compliance with prescribed medications will improve Ability to identify triggers associated with substance abuse/mental health issues will improve Ability to verbalize feelings will improve Compliance with prescribed medications will improve Ability to identify triggers associated with substance abuse/mental health issues will improve  Medication Management: Evaluate patient's response, side effects, and tolerance of medication regimen.  Therapeutic Interventions: 1 to 1 sessions, Unit Group sessions and Medication administration.  Evaluation of Outcomes: Adequate for  Discharge   RN Treatment  Plan for Primary Diagnosis: Bipolar disorder, curr episode mixed, severe, with psychotic features ([place]) Long Term Goal(s): Knowledge of disease and therapeutic regimen to maintain health will improve  Short Term Goals: Ability to identify and develop effective coping behaviors will improve and Compliance with prescribed medications will improve  Medication Management: RN will administer medications as ordered by provider, will assess and evaluate patient's response and provide education to patient for prescribed medication. RN will report any adverse and/or side effects to prescribing provider.  Therapeutic Interventions: 1 on 1 counseling sessions, Psychoeducation, Medication administration, Evaluate responses to treatment, Monitor vital signs and CBGs as ordered, Perform/monitor CIWA, COWS, AIMS and Fall Risk screenings as ordered, Perform wound care treatments as ordered.  Evaluation of Outcomes: Adequate for Discharge    Recreational Therapy Treatment Plan for Primary Diagnosis: Bipolar disorder, curr episode mixed, severe, with psychotic features ([place]) Long Term Goal(s): Patient will participate in recreation therapy treatment in at least 2 group sessions without prompting from LRT  Short Term Goals: Patient will be able to identify at least 5 coping skills for admitting diagnosis by conclusion of recreation therapy treatment  Treatment Modalities: Group and Pet Therapy  Therapeutic Interventions: Psychoeducation  Evaluation of Outcomes: Adequate for Discharge   LCSW Treatment Plan for Primary Diagnosis: Bipolar disorder, curr episode mixed, severe, with psychotic features ([place]) Long Term Goal(s): Safe transition to appropriate next level of care at discharge, Engage patient in therapeutic group addressing interpersonal concerns.  Short Term Goals: Engage patient in aftercare planning with referrals and resources  Therapeutic Interventions: Assess for all  discharge needs, 1 to 1 time with Social worker, Explore available resources and support systems, Assess for adequacy in community support network, Educate family and significant other(s) on suicide prevention, Complete Psychosocial Assessment, Interpersonal group therapy.  Evaluation of Outcomes: Met  Return home, follow up [place] in Treatment: Attending groups: Yes Participating in groups: Yes Taking medication as prescribed: Yes Toleration medication: Yes, no side effects reported at this time Family/Significant other contact made: No Patient understands diagnosis: Yes AEB asking for help with depression and SI, as well as mood instability and psychosis Discussing patient identified problems/goals with staff: Yes Medical problems stabilized or resolved: Yes Denies suicidal/homicidal ideation: Yes Issues/concerns per patient self-inventory: None Other: N/A  New problem(s) identified: None identified at this time.   New Short Term/Long Term Goal(s): "I want help with staying clean and staying positive  Discharge Plan or Barriers:   Reason for Continuation of Hospitalization:   Estimated Length of Stay: D/C today  Attendees: Patient:  02/02/2017  10:45 AM  Physician: Neita Garnet, MD 02/02/2017  10:45 AM  Nursing: Sena Hitch, RN 02/02/2017  10:45 AM  RN Care Manager: Lars Pinks, RN 02/02/2017  10:45 AM  Social Worker: Ripley Fraise 02/02/2017  10:45 AM  Recreational Therapist: Victorino Sparrow, LRT/CTRS 02/02/2017  10:45 AM  Other: Norberto Sorenson 02/02/2017  10:45 AM  Other:  02/02/2017  10:45 AM    Scribe for Treatment Team:  Roque Lias LCSW 02/02/2017 10:45 AM

## 2017-02-02 NOTE — [facility] Suicide Risk Assessment (Addendum)
[facility]   Principal Problem: Bipolar disorder, curr episode mixed, severe, with psychotic features [facility](HCC) Discharge Diagnoses:  Patient Active Problem List   Diagnosis Date Noted  . Bipolar disorder, curr episode mixed, severe, with psychotic features (HCC) [F31.64] 01/30/2017  . Cocaine use disorder, moderate, dependence (HCC) [F14.20] 01/30/2017  . Cannabis use disorder, severe, dependence (HCC) [F12.20] 01/30/2017    Total Time spent with patient: 30 minutes  Musculoskeletal: Strength & Muscle Tone: within normal limits Gait & Station: normal Patient leans: N/A  Psychiatric Specialty Exam: ROS denies headache, no chest pain, no shortness of breath, no vomiting   Blood pressure 140/90, pulse (!) 58, temperature 98 F (36.7 C), temperature source Oral, resp. rate 16, height 6' (1.829 m), weight 68.5 kg (151 lb), SpO2 100 %.Body mass index is 20.48 kg/m.  General Appearance: Well Groomed  Eye Contact::  Good  Speech:  Normal Rate409  Volume:  Normal  Mood:  reports his mood as " normal", denies depression  Affect:  mildly blunted, but more reactive , smiles at times appropriately  Thought Process:  Linear and Descriptions of Associations: Intact  Orientation:  Other:  fully alert, attentive, oriented x 3  Thought Content:  no hallucinations, no delusions, not internally preoccupied at this time  Suicidal Thoughts:  No denies any suicidal or self injurious ideations, denies any homicidal or violent ideations  Homicidal Thoughts:  No  Memory:  recent and remote grossly intact  Judgement:  Other:  improving  Insight:  improving   Psychomotor Activity:  normal  Concentration:  Good  Recall:  Good  Fund of Knowledge:Good  Language: Good  Akathisia:  Negative  Handed:  Right  AIMS (if indicated):   no abnormal or involuntary movements noted or reported   Assets:  Communication Skills Desire for Improvement Resilience  Sleep:  Number of Hours: 6   Cognition: WNL  ADL's:  Intact   Mental Status Per Nursing Assessment::   On Admission:     Demographic Factors:  26 year old single male, no children, lives alone, employed   Loss Factors: Grandmother recently passed away   Historical Factors: History of depression, one prior psychiatric admission as a teenager for " behavioral problems, not going to school". At this time denies history of prior suicide attempts . History of cannabis use disorder    Risk Reduction Factors:   Sense of responsibility to family, Employed and Positive coping skills or problem solving skills  Continued Clinical Symptoms:  At this time patient is alert, attentive, well groomed, calm, polite, denies feeling depressed and states mood is " 10/10 today", affect is mildly constricted, but reactive,and does smile at times appropriately. No thought disorder, no suicidal ideations , no self injurious ideations, no homicidal or violent ideations .  Denies psychotic symptoms, and does not appear internally preoccupied . Denies medication side effects - side effects reviewed .  Cognitive Features That Contribute To Risk:  No gross cognitive deficits noted upon discharge. Is alert , attentive, and oriented x 3    Suicide Risk:  Mild:  Suicidal ideation of limited frequency, intensity, duration, and specificity.  There are no identifiable plans, no associated intent, mild dysphoria and related symptoms, good self-control (both objective and subjective assessment), few other risk factors, and identifiable protective factors, including available and accessible social support.  Follow-up Information    [facility] Follow up on 02/06/2017.   Specialty:  Behavioral Health Why:  at 3:00pm for your initial  assessment with Dr. Cheyenne Adas. Please arrive 15 minutes early and bring your insurance card. Contact information: [address] [place] [address] [phone]           Plan Of Care/Follow-up  recommendations:  Activity:  as tolerated  Diet:  Regular Tests:  NA Other:  See below   Patient expresses readiness for discharge and is looking forward to discharging today. No current grounds for involuntary commitment . He plans to return home. Plans to follow up as above .   Craige Cotta, MD 02/02/2017, 12:03 PM

## 2017-03-04 ENCOUNTER — Emergency Department (HOSPITAL_COMMUNITY)
Admission: EM | Admit: 2017-03-04 | Discharge: 2017-03-04 | Disposition: A | Discharge status: Home / Self Care | Payer: Self-pay | Attending: Emergency Medicine | Admitting: Emergency Medicine

## 2017-03-04 ENCOUNTER — Encounter (HOSPITAL_COMMUNITY): Payer: Self-pay | Admitting: *Deleted

## 2017-03-04 DIAGNOSIS — Y999 Unspecified external cause status: Secondary | ICD-10-CM | POA: Insufficient documentation

## 2017-03-04 DIAGNOSIS — Y929 Unspecified place or not applicable: Secondary | ICD-10-CM | POA: Insufficient documentation

## 2017-03-04 DIAGNOSIS — Z79899 Other long term (current) drug therapy: Secondary | ICD-10-CM | POA: Insufficient documentation

## 2017-03-04 DIAGNOSIS — Y939 Activity, unspecified: Secondary | ICD-10-CM | POA: Insufficient documentation

## 2017-03-04 DIAGNOSIS — T148XXA Other injury of unspecified body region, initial encounter: Secondary | ICD-10-CM

## 2017-03-04 DIAGNOSIS — F17210 Nicotine dependence, cigarettes, uncomplicated: Secondary | ICD-10-CM | POA: Insufficient documentation

## 2017-03-04 DIAGNOSIS — S71111A Laceration without foreign body, right thigh, initial encounter: Secondary | ICD-10-CM | POA: Insufficient documentation

## 2017-03-04 DIAGNOSIS — W260XXA Contact with knife, initial encounter: Secondary | ICD-10-CM | POA: Insufficient documentation

## 2017-03-04 MED ORDER — OXYCODONE-ACETAMINOPHEN 5-325 MG PO TABS
1.0000 | ORAL_TABLET | Freq: Once | ORAL | Status: AC
  Administered 2017-03-04: 1 via ORAL
  Filled 2017-03-04: qty 1

## 2017-03-04 MED ORDER — LIDOCAINE HCL 2 % IJ SOLN
10.0000 mL | Freq: Once | INTRAMUSCULAR | Status: AC
  Administered 2017-03-04: 200 mg
  Filled 2017-03-04: qty 20

## 2017-03-04 MED ORDER — HYDROCODONE-ACETAMINOPHEN 5-325 MG PO TABS: 1.0000 | ORAL_TABLET | ORAL | 0 refills | Status: DC | PRN

## 2017-03-04 NOTE — ED Notes (Signed)
Pain is better since he had the pain med

## 2017-03-04 NOTE — ED Notes (Signed)
edp at the bedside suturing

## 2017-03-04 NOTE — ED Notes (Signed)
The trauma level one documentation was started by mistake  I did not hear that dr Blinda Leatherwoodpollina did not want this leveled  Ended charting on trauma  Chart [id number]
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
## 2017-03-17 ENCOUNTER — Emergency Department (HOSPITAL_COMMUNITY)
Admission: EM | Admit: 2017-03-17 | Discharge: 2017-03-17 | Disposition: A | Discharge status: Home / Self Care | Payer: Self-pay

## 2017-03-17 NOTE — ED Notes (Signed)
Pt called for triage, no answer

## 2017-03-31 ENCOUNTER — Emergency Department (HOSPITAL_COMMUNITY)
Admission: EM | Admit: 2017-03-31 | Discharge: 2017-03-31 | Disposition: A | Discharge status: Home / Self Care | Payer: Self-pay | Attending: Emergency Medicine | Admitting: Emergency Medicine

## 2017-03-31 ENCOUNTER — Encounter (HOSPITAL_COMMUNITY): Payer: Self-pay | Admitting: *Deleted

## 2017-03-31 DIAGNOSIS — F17210 Nicotine dependence, cigarettes, uncomplicated: Secondary | ICD-10-CM | POA: Insufficient documentation

## 2017-03-31 DIAGNOSIS — Z4802 Encounter for removal of sutures: Secondary | ICD-10-CM | POA: Insufficient documentation
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
## 2017-03-31 NOTE — ED Notes (Signed)
ED Provider at bedside. 
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
## 2018-07-20 ENCOUNTER — Encounter (HOSPITAL_COMMUNITY): Payer: Self-pay

## 2018-07-20 ENCOUNTER — Other Ambulatory Visit: Payer: Self-pay

## 2018-07-20 ENCOUNTER — Emergency Department (HOSPITAL_COMMUNITY)
Admission: EM | Admit: 2018-07-20 | Discharge: 2018-07-22 | Disposition: A | Discharge status: Home / Self Care | Payer: Self-pay | Attending: Emergency Medicine | Admitting: Emergency Medicine

## 2018-07-20 DIAGNOSIS — F329 Major depressive disorder, single episode, unspecified: Secondary | ICD-10-CM | POA: Insufficient documentation

## 2018-07-20 DIAGNOSIS — F17210 Nicotine dependence, cigarettes, uncomplicated: Secondary | ICD-10-CM | POA: Insufficient documentation

## 2018-07-20 DIAGNOSIS — R45851 Suicidal ideations: Secondary | ICD-10-CM | POA: Insufficient documentation

## 2018-07-20 DIAGNOSIS — F39 Unspecified mood [affective] disorder: Secondary | ICD-10-CM

## 2018-07-20 DIAGNOSIS — F1910 Other psychoactive substance abuse, uncomplicated: Secondary | ICD-10-CM

## 2018-07-20 DIAGNOSIS — F419 Anxiety disorder, unspecified: Secondary | ICD-10-CM | POA: Insufficient documentation

## 2018-07-20 DIAGNOSIS — F1414 Cocaine abuse with cocaine-induced mood disorder: Secondary | ICD-10-CM | POA: Diagnosis present

## 2018-07-20 DIAGNOSIS — Z590 Homelessness: Secondary | ICD-10-CM | POA: Insufficient documentation

## 2018-07-20 DIAGNOSIS — Z046 Encounter for general psychiatric examination, requested by authority: Secondary | ICD-10-CM | POA: Insufficient documentation

## 2018-07-20 LAB — CBC
HCT: 47.3 % (ref 39.0–52.0)
Hemoglobin: 15.6 g/dL (ref 13.0–17.0)
MCH: 30.4 pg (ref 26.0–34.0)
MCHC: 33 g/dL (ref 30.0–36.0)
MCV: 92.2 fL (ref 80.0–100.0)
Platelets: 236 10*3/uL (ref 150–400)
RBC: 5.13 MIL/uL (ref 4.22–5.81)
RDW: 13.2 % (ref 11.5–15.5)
WBC: 6.7 10*3/uL (ref 4.0–10.5)
nRBC: 0 % (ref 0.0–0.2)

## 2018-07-20 LAB — RAPID URINE DRUG SCREEN, HOSP PERFORMED
Amphetamines: NOT DETECTED
Barbiturates: NOT DETECTED
Benzodiazepines: NOT DETECTED
COCAINE: POSITIVE — AB
Opiates: NOT DETECTED
Tetrahydrocannabinol: POSITIVE — AB

## 2018-07-20 LAB — COMPREHENSIVE METABOLIC PANEL
ALK PHOS: 66 U/L (ref 38–126)
ALT: 15 U/L (ref 0–44)
ANION GAP: 7 (ref 5–15)
AST: 24 U/L (ref 15–41)
Albumin: 3.9 g/dL (ref 3.5–5.0)
BILIRUBIN TOTAL: 1.5 mg/dL — AB (ref 0.3–1.2)
BUN: 12 mg/dL (ref 6–20)
CALCIUM: 9.2 mg/dL (ref 8.9–10.3)
CO2: 30 mmol/L (ref 22–32)
Chloride: 103 mmol/L (ref 98–111)
Creatinine, Ser: 1.22 mg/dL (ref 0.61–1.24)
GFR calc Af Amer: >60 mL/min (ref >60)
Glucose, Bld: 100 mg/dL — ABNORMAL HIGH (ref 70–99)
POTASSIUM: 4.1 mmol/L (ref 3.5–5.1)
Sodium: 140 mmol/L (ref 135–145)
TOTAL PROTEIN: 6.6 g/dL (ref 6.5–8.1)

## 2018-07-20 LAB — ACETAMINOPHEN LEVEL: Acetaminophen (Tylenol), Serum: <10 ug/mL — ABNORMAL LOW (ref 10–30)

## 2018-07-20 LAB — SALICYLATE LEVEL: Salicylate Lvl: <7 mg/dL (ref 2.8–30.0)

## 2018-07-20 LAB — ETHANOL

## 2018-07-20 NOTE — ED Provider Notes (Signed)
[facility] EM[facility]ENC[facility] Provider Note   CSN: [id number] Arrival date & time: 07/20/18  1541     History   Chief Complaint Chief Complaint  Patient presents with  . Depression    HPI Daniel [name] is a 27 y.o. male.  Patient indicates hx anxiety and depression, and states he is feeling both anxious and depressed. States not on medication for either condition. States he feels increased stressed is coming from fact that his girlfriend is [redacted] weeks pregnant, and that she seems to have cut him out of her life. He does not know where she is staying, or if she possible went back to live in the [place] area, where she was from. Pt notes intermittent fleeting thoughts of suicide. No definite plan. Denies any attempt at self harm. Denies any thoughts of harm to others. No hallucinations.  Is currently homeless, is trying to intermittently live with friends/family. Denies acute physical illness. Hx substance abuse, intermittent uses cocaine,thc. Denies heavy or daily etoh use. Normal appetite. No recent wt change. Is able to sleep fine at night.   The history is provided by the patient.  Depression  Pertinent negatives include no chest pain, no abdominal pain, no headaches and no shortness of breath.    History reviewed. No pertinent past medical history.  Patient Active Problem List   Diagnosis Date Noted  . Bipolar disorder, curr episode mixed, severe, with psychotic features (HCC) 01/30/2017  . Cocaine use disorder, moderate, dependence (HCC) 01/30/2017  . Cannabis use disorder, severe, dependence (HCC) 01/30/2017    History reviewed. No pertinent surgical history.      Home Medications    Prior to Admission medications   Medication Sig Start Date End Date Taking? Authorizing Provider  divalproex (DEPAKOTE) 500 MG DR tablet Take 1 tablet (500 mg total) by mouth every 12 (twelve) hours. Patient not taking: Reported on 03/04/2017 02/02/17   Beau FannyWithrow,  John C, FNP  HYDROcodone-acetaminophen (NORCO/VICODIN) 5-325 MG tablet Take 1-2 tablets by mouth every 4 (four) hours as needed for moderate pain. 03/04/17   Gilda CreasePollina, Christopher J, MD  hydrOXYzine (ATARAX/VISTARIL) 25 MG tablet Take 1 tablet (25 mg total) by mouth 3 (three) times daily. Patient not taking: Reported on 03/04/2017 02/02/17   Beau FannyWithrow, John C, FNP  nicotine (NICODERM CQ - DOSED IN MG/24 HOURS) 14 mg/24hr patch Place 1 patch (14 mg total) onto the skin daily. Patient not taking: Reported on 03/04/2017 02/03/17   Beau FannyWithrow, John C, FNP  QUEtiapine (SEROQUEL) 100 MG tablet Take 1 tablet (100 mg total) by mouth at bedtime. Patient not taking: Reported on 03/04/2017 02/02/17   Beau FannyWithrow, John C, FNP    Family History Family History  Problem Relation Age of Onset  . Drug abuse Mother     Social History Social History   Tobacco Use  . Smoking status: Current Every Day Smoker    Packs/day: 0.50    Types: Cigarettes  . Smokeless tobacco: Never Used  Substance Use Topics  . Alcohol use: Yes    Comment: occ  . Drug use: Yes    Types: Marijuana, Cocaine     Allergies   Patient has no known allergies.   Review of Systems Review of Systems  Constitutional: Negative for fever.  HENT: Negative for sore throat.   Eyes: Negative for redness.  Respiratory: Negative for shortness of breath.   Cardiovascular: Negative for chest pain.  Gastrointestinal: Negative for abdominal pain and vomiting.  Genitourinary: Negative for flank pain.  Musculoskeletal: Negative for neck pain.  Skin: Negative for rash.  Neurological: Negative for headaches.  Hematological: Does not bruise/bleed easily.  Psychiatric/Behavioral: Positive for depression and dysphoric mood. The patient is nervous/anxious.      Physical Exam Updated Vital Signs BP 134/82 (BP Location: Right Arm)   Pulse 79   Temp 98.1 F (36.7 C) (Oral)   Resp (!) 116   Ht 1.803 m (5\' 11" )   Wt 69.9 kg   SpO2 100%   BMI 21.48  kg/m   Physical Exam Vitals signs and nursing note reviewed.  Constitutional:      Appearance: He is well-developed.  HENT:     Head: Atraumatic.  Eyes:     Conjunctiva/sclera: Conjunctivae normal.     Pupils: Pupils are equal, round, and reactive to light.  Neck:     Musculoskeletal: Neck supple.     Trachea: No tracheal deviation.  Cardiovascular:     Rate and Rhythm: Normal rate and regular rhythm.     Pulses: Normal pulses.     Heart sounds: Normal heart sounds. No murmur. No friction rub. No gallop.   Pulmonary:     Effort: Pulmonary effort is normal. No accessory muscle usage or respiratory distress.     Breath sounds: Normal breath sounds.  Abdominal:     General: There is no distension.     Palpations: Abdomen is soft.     Tenderness: There is no abdominal tenderness.  Musculoskeletal:        General: No swelling.  Skin:    General: Skin is warm and dry.  Neurological:     Mental Status: He is alert.     Comments: Speech normal. Steady gait. No tremor or shakes.   Psychiatric:        Mood and Affect: Mood normal.        Behavior: Behavior normal.        Thought Content: Thought content normal.      ED Treatments / Results  Labs (all labs ordered are listed, but only abnormal results are displayed) Results for orders placed or performed during the hospital encounter of 07/20/18  Comprehensive metabolic panel  Result Value Ref Range   Sodium 140 135 - 145 mmol/L   Potassium 4.1 3.5 - 5.1 mmol/L   Chloride 103 98 - 111 mmol/L   CO2 30 22 - 32 mmol/L   Glucose, Bld 100 (H) 70 - 99 mg/dL   BUN 12 6 - 20 mg/dL   Creatinine, Ser 6.96 0.61 - 1.24 mg/dL   Calcium 9.2 8.9 - 29.5 mg/dL   Total Protein 6.6 6.5 - 8.1 g/dL   Albumin 3.9 3.5 - 5.0 g/dL   AST 24 15 - 41 U/L   ALT 15 0 - 44 U/L   Alkaline Phosphatase 66 38 - 126 U/L   Total Bilirubin 1.5 (H) 0.3 - 1.2 mg/dL   GFR calc non Af Amer >60 >60 mL/min   GFR calc Af Amer >60 >60 mL/min   Anion gap 7 5 -  15  cbc  Result Value Ref Range   WBC 6.7 4.0 - 10.5 K/uL   RBC 5.13 4.22 - 5.81 MIL/uL   Hemoglobin 15.6 13.0 - 17.0 g/dL   HCT 28.4 13.2 - 44.0 %   MCV 92.2 80.0 - 100.0 fL   MCH 30.4 26.0 - 34.0 pg   MCHC 33.0 30.0 - 36.0 g/dL   RDW 10.2 72.5 - 36.6 %   Platelets 236 150 -  400 K/uL   nRBC 0.0 0.0 - 0.2 %    EKG None  Radiology No results found.  Procedures Procedures (including critical care time)  Medications Ordered in ED Medications - No data to display   Initial Impression / Assessment and Plan / ED Course  I have reviewed the triage vital signs and the nursing notes.  Pertinent labs & imaging results that were available during my care of the patient were reviewed by me and considered in my medical decision making (see chart for details).  BH team consulted.  Labs sent.   Reviewed nursing notes and prior charts for additional history.   Labs reviewed - chem normal.  BH evaluation pending.  Disposition per [facility] team.     Final Clinical Impressions(s) / ED Diagnoses   Final diagnoses:  None    ED Discharge Orders    None       Cathren Laine, MD 07/20/18 [phone]

## 2018-07-20 NOTE — ED Notes (Signed)
Belongings inventoried -  2 labeled belongings bags placed in [place] - Valuables envelope - Security - NO Cell Phone or money noted.
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
## 2018-07-20 NOTE — ED Notes (Signed)
States he would like to try to talk to his "baby's mama". States "she was all right when I left but I ain't talked to her in a while". Pt voiced understanding of Medical Clearance Pt Policy. Pt eating dinner at this time. Aware of need for urine specimen.

## 2018-07-20 NOTE — ED Notes (Signed)
TTS being performed.  

## 2018-07-20 NOTE — ED Triage Notes (Signed)
Pt states his girlfriend was pregnant with his first child and she is no longer speaking to him. Pt states he is depressed and having thoughts of harming himself.

## 2018-07-21 MED ORDER — CITALOPRAM HYDROBROMIDE 10 MG PO TABS
10.0000 mg | ORAL_TABLET | Freq: Every day | ORAL | Status: DC
  Administered 2018-07-21 – 2018-07-22 (×2): 10 mg via ORAL
  Filled 2018-07-21 (×2): qty 1

## 2018-07-21 MED ORDER — ACETAMINOPHEN 500 MG PO TABS
1000.0000 mg | ORAL_TABLET | Freq: Four times a day (QID) | ORAL | Status: DC | PRN
  Administered 2018-07-21: 1000 mg via ORAL
  Filled 2018-07-21: qty 2

## 2018-07-21 MED ORDER — TRAZODONE HCL 100 MG PO TABS
100.0000 mg | ORAL_TABLET | Freq: Every day | ORAL | Status: DC
  Administered 2018-07-21: 100 mg via ORAL
  Filled 2018-07-21: qty 1

## 2018-07-21 NOTE — Progress Notes (Signed)
Patient meets criteria for inpatient treatment. [facility] available [facility]. CSW faxed referrals to the following facilities for review:  [address], [facility], [address], [address], [address], [address], [facility], [facility], [facility] [address], [address], [place], [facility], [facility], [facility], and [place].  TTS will continue to seek bed placement.  Vilma MeckelEarl R. Algis GreenhouseForbes, MSW, LCSW Clinical Social Work/Disposition Phone: [phone] Fax: [phone]

## 2018-07-21 NOTE — ED Notes (Signed)
Re-TTS being performed.  

## 2018-07-21 NOTE — ED Notes (Signed)
Pt on phone at nurses' desk. 

## 2018-07-21 NOTE — BH Assessment (Signed)
Reassessment Note: Pt presents lying in hospital bed, alert and oriented x 4. Pt reports continued depressed mood and suicidal ideation. He denies homicidal ideation and AVH. Pt reports he did not sleep last night. Inpt tx continues to be recommendation for pt.

## 2018-07-21 NOTE — ED Notes (Signed)
Pt ambulatory to bathroom. Steady gait and no difficulty noted.

## 2018-07-21 NOTE — ED Notes (Signed)
Pt ended conversation as asked by this RN d/t pt voiced understanding of 5-minute phone call limit.

## 2018-07-21 NOTE — ED Notes (Signed)
Pt in shower.  

## 2018-07-21 NOTE — ED Provider Notes (Signed)
Evaluated patient for ED 8 AM rounds.  RN, Kriste BasqueBecky, reports no changes or difficulties overnight or this morning.  Chart review reveals the same.  Patient initially sleeping.  Easily aroused.  No current complaints.    Abnormal Labs Reviewed  COMPREHENSIVE METABOLIC PANEL - Abnormal; Notable for the following components:      Result Value   Glucose, Bld 100 (*)    Total Bilirubin 1.5 (*)    All other components within normal limits  ACETAMINOPHEN LEVEL - Abnormal; Notable for the following components:   Acetaminophen (Tylenol), Serum <10 (*)    All other components within normal limits  RAPID URINE DRUG SCREEN, HOSP PERFORMED - Abnormal; Notable for the following components:   Cocaine POSITIVE (*)    Tetrahydrocannabinol POSITIVE (*)    All other components within normal limits    No results found.  Physical Exam  Constitutional: He is oriented to person, place, and time and well-developed, well-nourished, and in no distress.  HENT:  Head: Normocephalic and atraumatic.  Eyes: Conjunctivae are normal.  Neck: Neck supple.  Cardiovascular: Normal rate, regular rhythm, normal heart sounds and intact distal pulses.  Pulmonary/Chest: Effort normal and breath sounds normal.  Abdominal: Soft. There is no abdominal tenderness.  Musculoskeletal:        General: No edema.  Neurological: He is alert and oriented to person, place, and time.  Skin: Skin is warm and dry.  Psychiatric: Mood and affect normal.  Nursing note and vitals reviewed.   Vitals:   07/20/18 1558 07/20/18 1758 07/21/18 0205 07/21/18 0652  BP:  136/71 132/74 126/72  Pulse:  (!) 58 60 62  Resp:  16 16 16   Temp:  98.3 F (36.8 C) 98.4 F (36.9 C) 98.6 F (37 C)  TempSrc:  Oral Oral Oral  SpO2:  98% 100% 100%  Weight: 69.9 kg     Height: 5\' 11"  (1.803 m)         Anselm PancoastJoy, Tatem Holsonback C, PA-C 07/21/18 [id number]    Wynetta FinesMessick, Peter C, MD 07/21/18 [phone]

## 2018-07-22 DIAGNOSIS — F1414 Cocaine abuse with cocaine-induced mood disorder: Secondary | ICD-10-CM | POA: Diagnosis present

## 2018-07-22 MED ORDER — TRAZODONE HCL 100 MG PO TABS: 100.0000 mg | ORAL_TABLET | Freq: Every day | ORAL | 0 refills | Status: DC

## 2018-07-22 MED ORDER — TRAZODONE HCL 100 MG PO TABS: 100.0000 mg | ORAL_TABLET | Freq: Every day | ORAL | 1 refills | Status: DC

## 2018-07-22 MED ORDER — HYDROXYZINE HCL 50 MG PO TABS
50.0000 mg | ORAL_TABLET | Freq: Once | ORAL | Status: AC
  Administered 2018-07-22: 50 mg via ORAL
  Filled 2018-07-22: qty 1

## 2018-07-22 MED ORDER — HYDROXYZINE HCL 25 MG PO TABS: 25.0000 mg | ORAL_TABLET | Freq: Three times a day (TID) | ORAL | 0 refills | Status: DC

## 2018-07-22 MED ORDER — CITALOPRAM HYDROBROMIDE 10 MG PO TABS: 20.0000 mg | ORAL_TABLET | Freq: Every day | ORAL | 0 refills | Status: DC

## 2018-07-22 MED ORDER — CITALOPRAM HYDROBROMIDE 10 MG PO TABS: 10.0000 mg | ORAL_TABLET | Freq: Every day | ORAL | 0 refills | Status: DC

## 2018-07-22 NOTE — ED Notes (Signed)
Pt making phone calls (x2) trying to find out where his "baby mama" is. Pt informed this nurse he believes pt is staying with someone named Genevie [place]illy in [place] and if he gets discharged today he intends to go out and see if she is there. Denies any intent to harm anyone when asked by this RN.

## 2018-07-22 NOTE — ED Notes (Signed)
Regular Diet was ordered for Lunch. 

## 2018-07-22 NOTE — BH Assessment (Signed)
Reassessment--Spoke with pt who said that he still has not been able to get into contact with his GF, and she is still on his mind. He said that he was given Klonopin last night and was able to sleep. He states that he has been having nightmares during his sleep.  Pt states that he is no longer feeling suicidal, denies feeling homicidal, psychosis and feels safe to DC. Pt denied having OP providers when originally assessed on Saturday by this writer, but today he said that he [facility]" and his medication (something..."dine") is at his sister's house. Pt's affect is inconsistent with thought content in that he states that he "needs something for anxiety", but he is smiling and joking around.   Per Nanine MeansJamison Lord, DNP, pt may be discharged.

## 2018-07-22 NOTE — Discharge Instructions (Addendum)
Please read attached information. If you experience any new or worsening signs or symptoms please return to the emergency room for evaluation. Please follow-up with your primary care provider or specialist as discussed. Please use medication prescribed only as directed and discontinue taking if you have any concerning signs or symptoms.   °

## 2018-07-22 NOTE — [facility] Suicide Risk Assessment (Signed)
Suicide Risk Assessment  Discharge Assessment   [facility]   Principal Problem: Cocaine abuse with cocaine-induced mood disorder [facility](HCC) Discharge Diagnoses: Principal Problem:   Cocaine abuse with cocaine-induced mood disorder (HCC)   Total Time spent with patient: 30 minutes  Musculoskeletal: Strength & Muscle Tone: within normal limits Gait & Station: normal Patient leans: N/A  Psychiatric Specialty Exam:   Blood pressure 126/83, pulse (!) 58, temperature 98.3 F (36.8 C), temperature source Oral, resp. rate 17, height 5\' 11"  (1.803 m), weight 69.9 kg, SpO2 99 %.Body mass index is 21.48 kg/m.  General Appearance: Casual  Eye Contact::  Good  Speech:  Normal Rate409  Volume:  Normal  Mood:  Anxious, mild  Affect:  Congruent  Thought Process:  Coherent and Descriptions of Associations: Intact  Orientation:  Full (Time, Place, and Person)  Thought Content:  WDL and Logical  Suicidal Thoughts:  No  Homicidal Thoughts:  No  Memory:  Immediate;   Good Recent;   Good Remote;   Good  Judgement:  Fair  Insight:  Good  Psychomotor Activity:  Normal  Concentration:  Good  Recall:  Good  Fund of Knowledge:Good  Language: Good  Akathisia:  No  Handed:  Right  AIMS (if indicated):     Assets:  Leisure Time Physical Health Resilience Social Support  Sleep:     Cognition: WNL  ADL's:  Intact   Mental Status Per Nursing Assessment::   On Admission:   27 yo male who presented to the ED with suicidal ideations.  Medications were started and these resolved.  He did report some anxiety today and hydroxyzine was given.  Denies suicidal/homicidal ideations, hallucinations, and withdrawal symptoms.  He had reported earlier he was upset because he found out his exgirlfriend is living with another guy in [place] and wanted to hurt him.  On assessment a few minutes ago, he denies wanting to hurt anyone (witnessed by Marquis LunchEmily Adkins, RN).  Stable for discharge.   Dr Roosvelt HarpsJackie Norman reviewed this patient and concurs with the plan.  Demographic Factors:  Male  Loss Factors: NA  Historical Factors: NA  Risk Reduction Factors:   Sense of responsibility to family and Positive social support  Continued Clinical Symptoms:  Anxiety, mild  Cognitive Features That Contribute To Risk:  None    Suicide Risk:  Minimal: No identifiable suicidal ideation.  Patients presenting with no risk factors but with morbid ruminations; may be classified as minimal risk based on the severity of the depressive symptoms  Follow-up Information    [facility].   Specialty:  Emergency Medicine Why:  If symptoms worsen Contact information: [address] [id number] [place] [id number] [phone]          Plan Of Care/Follow-up recommendations:  Activity:  as tolerated Diet:  heart healthy diet  Mando Blatz, NP 07/22/2018, 12:31 PM

## 2018-07-22 NOTE — ED Provider Notes (Signed)
27 year old male with suicidal ideations.  Please see previous providers note for full H&P.  No change in medical care awaiting behavioral health disposition.  Resting comfortably in exam bed.  Vitals:   07/21/18 1822 07/22/18 0500  BP: (!) 143/95 116/77  Pulse:  80  Resp:  16  Temp:  98 F (36.7 C)  SpO2:  98%      Eyvonne MechanicHedges, Kinsley Holderman, PA-C 07/22/18 [id number]    Rolan BuccoBelfi, Melanie, MD 07/22/18 1530

## 2018-07-22 NOTE — Progress Notes (Signed)
27 yo male who presented to the ED with suicidal ideations.  Medications were started and these resolved.  He did report some anxiety today and hydroxyzine was given.  Denies suicidal/homicidal ideations, hallucinations, and withdrawal symptoms.  He had reported earlier he was upset because he found out his exgirlfriend is living with another guy [place] and wanted to hurt him.  On assessment a few minutes ago, he denies wanting to hurt anyone (witnessed by Marquis LunchEmily Adkins, RN).  Stable for discharge.  Dr Roosvelt HarpsJackie Norman reviewed this patient and concurs with the plan.  Nanine MeansJamison Lord, PMHNP

## 2018-07-22 NOTE — ED Notes (Signed)
Breakfast tray ordered 

## 2018-08-18 ENCOUNTER — Encounter (HOSPITAL_COMMUNITY): Payer: Self-pay

## 2018-08-18 ENCOUNTER — Emergency Department (HOSPITAL_COMMUNITY)
Admission: EM | Admit: 2018-08-18 | Discharge: 2018-08-18 | Disposition: A | Discharge status: Home / Self Care | Payer: Self-pay | Attending: Emergency Medicine | Admitting: Emergency Medicine

## 2018-08-18 ENCOUNTER — Other Ambulatory Visit: Payer: Self-pay

## 2018-08-18 ENCOUNTER — Emergency Department (HOSPITAL_COMMUNITY): Payer: Self-pay

## 2018-08-18 ENCOUNTER — Encounter (HOSPITAL_COMMUNITY): Payer: Self-pay | Admitting: Emergency Medicine

## 2018-08-18 DIAGNOSIS — F17210 Nicotine dependence, cigarettes, uncomplicated: Secondary | ICD-10-CM | POA: Insufficient documentation

## 2018-08-18 DIAGNOSIS — X501XXA Overexertion from prolonged static or awkward postures, initial encounter: Secondary | ICD-10-CM | POA: Insufficient documentation

## 2018-08-18 DIAGNOSIS — Y9367 Activity, basketball: Secondary | ICD-10-CM | POA: Insufficient documentation

## 2018-08-18 DIAGNOSIS — S93401D Sprain of unspecified ligament of right ankle, subsequent encounter: Secondary | ICD-10-CM | POA: Insufficient documentation

## 2018-08-18 DIAGNOSIS — Y33XXXD Other specified events, undetermined intent, subsequent encounter: Secondary | ICD-10-CM | POA: Insufficient documentation

## 2018-08-18 DIAGNOSIS — Y999 Unspecified external cause status: Secondary | ICD-10-CM | POA: Insufficient documentation

## 2018-08-18 DIAGNOSIS — Z79899 Other long term (current) drug therapy: Secondary | ICD-10-CM | POA: Insufficient documentation

## 2018-08-18 DIAGNOSIS — S93402A Sprain of unspecified ligament of left ankle, initial encounter: Secondary | ICD-10-CM | POA: Insufficient documentation

## 2018-08-18 DIAGNOSIS — Y929 Unspecified place or not applicable: Secondary | ICD-10-CM | POA: Insufficient documentation

## 2018-08-18 MED ORDER — IBUPROFEN 400 MG PO TABS
600.0000 mg | ORAL_TABLET | Freq: Once | ORAL | Status: AC
  Administered 2018-08-18: 600 mg via ORAL
  Filled 2018-08-18: qty 1

## 2018-08-18 NOTE — ED Triage Notes (Signed)
Pt states he injured l ankle yesterday while playng basketball

## 2018-08-18 NOTE — ED Notes (Signed)
Pt stable, ambulatory, states understanding of discharge instructions 

## 2018-08-18 NOTE — ED Provider Notes (Signed)
[facility] EMERGENCY DEPARTMENT Provider Note   CSN: [id number] Arrival date & time: 08/18/18  0751     History   Chief Complaint Chief Complaint  Patient presents with  . Ankle Injury    HPI Daniel [name] is a 28 y.o. male.  28 year old male presents with complaint of left ankle injury.  Patient states that he landed wrong after jumping for a slam dunk yesterday and rolled his ankle.  Patient reports pain and swelling to the lateral aspect of his left ankle, pain worse with bearing weight, has not taken anything for his pain.  No prior ankle injuries.  No other injuries, complaints, concerns.     History reviewed. No pertinent past medical history.  Patient Active Problem List   Diagnosis Date Noted  . Cocaine abuse with cocaine-induced mood disorder (HCC) 07/22/2018  . Bipolar disorder, curr episode mixed, severe, with psychotic features (HCC) 01/30/2017  . Cocaine use disorder, moderate, dependence (HCC) 01/30/2017  . Cannabis use disorder, severe, dependence (HCC) 01/30/2017    History reviewed. No pertinent surgical history.      Home Medications    Prior to Admission medications   Medication Sig Start Date End Date Taking? Authorizing Provider  acetaminophen (TYLENOL) 500 MG tablet Take 1,000 mg by mouth every 6 (six) hours as needed for headache (pain).    [provider]  citalopram (CELEXA) 10 MG tablet Take 2 tablets (20 mg total) by mouth daily. 07/23/18   Charm Rings, NP  hydrOXYzine (ATARAX/VISTARIL) 25 MG tablet Take 1 tablet (25 mg total) by mouth 3 (three) times daily. 07/22/18   Charm Rings, NP  traZODone (DESYREL) 100 MG tablet Take 1 tablet (100 mg total) by mouth at bedtime. 07/22/18   Charm Rings, NP    Family History Family History  Problem Relation Age of Onset  . Drug abuse Mother     Social History Social History   Tobacco Use  . Smoking status: Current Every Day Smoker    Packs/day: 0.50     Types: Cigarettes  . Smokeless tobacco: Never Used  Substance Use Topics  . Alcohol use: Yes    Comment: occ  . Drug use: Yes    Types: Marijuana, Cocaine     Allergies   Patient has no known allergies.   Review of Systems Review of Systems  Constitutional: Negative for fever.  Musculoskeletal: Positive for arthralgias, joint swelling and myalgias.  Skin: Negative for color change, rash and wound.  Allergic/Immunologic: Negative for immunocompromised state.  Neurological: Negative for weakness and numbness.  Hematological: Does not bruise/bleed easily.  Psychiatric/Behavioral: Negative for self-injury.  All other systems reviewed and are negative.    Physical Exam Updated Vital Signs BP (!) 136/103   Pulse 84   Temp 97.7 F (36.5 C) (Oral)   SpO2 100%   Physical Exam Vitals signs and nursing note reviewed.  Constitutional:      General: He is not in acute distress.    Appearance: He is well-developed. He is not diaphoretic.  HENT:     Head: Normocephalic and atraumatic.  Cardiovascular:     Pulses: Normal pulses.  Pulmonary:     Effort: Pulmonary effort is normal.  Musculoskeletal:        General: Swelling, tenderness and signs of injury present.     Left knee: Normal.     Left ankle: He exhibits decreased range of motion, swelling and ecchymosis. He exhibits no deformity, no laceration and normal  pulse. Tenderness. Lateral malleolus tenderness found. No medial malleolus, no head of 5th metatarsal and no proximal fibula tenderness found.  Skin:    General: Skin is warm and dry.     Capillary Refill: Capillary refill takes less than 2 seconds.     Findings: No erythema or rash.  Neurological:     Mental Status: He is alert and oriented to person, place, and time.  Psychiatric:        Behavior: Behavior normal.      ED Treatments / Results  Labs (all labs ordered are listed, but only abnormal results are displayed) Labs Reviewed - No data to  display  EKG None  Radiology Dg Ankle Complete Left  Result Date: 08/18/2018 CLINICAL DATA:  Left ankle pain after injury playing basketball yesterday. EXAM: LEFT ANKLE COMPLETE - 3+ VIEW COMPARISON:  None. FINDINGS: There is no evidence of fracture, dislocation, or joint effusion. There is no evidence of arthropathy or other focal bone abnormality. Soft tissue swelling is seen over lateral malleolus. IMPRESSION: No fracture or dislocation is noted. Soft tissue swelling is seen over lateral malleolus. Electronically Signed   By: Lupita RaiderJames  Green Jr, M.D.   On: 08/18/2018 08:58    Procedures Procedures (including critical care time)  Medications Ordered in ED Medications  ibuprofen (ADVIL,MOTRIN) tablet 600 mg (600 mg Oral Given 08/18/18 [id number])     Initial Impression / Assessment and Plan / ED Course  I have reviewed the triage vital signs and the nursing notes.  Pertinent labs & imaging results that were available during my care of the patient were reviewed by me and considered in my medical decision making (see chart for details).  Clinical Course as of Aug 18 929  Sun Aug 18, 2018  [id number] 28 year old male presents with complaint of left ankle injury after landing on his ankle wrong while playing basketball yesterday.  X-ray shows swelling to the lateral ankle without acute bony injury.  Patient was placed in ankle ASO and given crutches to weight-bear as tolerated.  Recommend ice, elevate, Motrin Tylenol for pain and follow-up with PCP in 1 week for reevaluation.   [LM]    Clinical Course User Index [LM] Jeannie FendMurphy, Ravis Herne A, PA-C   Final Clinical Impressions(s) / ED Diagnoses   Final diagnoses:  Sprain of left ankle, unspecified ligament, initial encounter    ED Discharge Orders    None       Jeannie FendMurphy, Daleysa Kristiansen A, PA-C 08/18/18 [id number]    Mancel BaleWentz, Elliott, MD 08/19/18 1048

## 2018-08-18 NOTE — ED Provider Notes (Signed)
[facility] Provider Note   CSN: [id number] Arrival date & time: 08/18/18  1901     History   Chief Complaint Chief Complaint  Patient presents with  . Ankle Pain    HPI Daniel [name] is a 28 y.o. male who presents with request for a boot.  Patient was seen here earlier today for a left ankle sprain.  He was discharged with crutches and an ASO brace.  He states that he went home and the crutches have been making him trip and he feels unable to use it.  The swelling has improved and he has been able to walk on the foot therefore he is asking for a boot instead.  Patient denies any other complaints currently.  HPI  History reviewed. No pertinent past medical history.  Patient Active Problem List   Diagnosis Date Noted  . Cocaine abuse with cocaine-induced mood disorder (HCC) 07/22/2018  . Bipolar disorder, curr episode mixed, severe, with psychotic features (HCC) 01/30/2017  . Cocaine use disorder, moderate, dependence (HCC) 01/30/2017  . Cannabis use disorder, severe, dependence (HCC) 01/30/2017    History reviewed. No pertinent surgical history.      Home Medications    Prior to Admission medications   Medication Sig Start Date End Date Taking? Authorizing Provider  acetaminophen (TYLENOL) 500 MG tablet Take 1,000 mg by mouth every 6 (six) hours as needed for headache (pain).    [provider]  citalopram (CELEXA) 10 MG tablet Take 2 tablets (20 mg total) by mouth daily. 07/23/18   Charm RingsLord, Jamison Y, NP  hydrOXYzine (ATARAX/VISTARIL) 25 MG tablet Take 1 tablet (25 mg total) by mouth 3 (three) times daily. 07/22/18   Charm RingsLord, Jamison Y, NP  traZODone (DESYREL) 100 MG tablet Take 1 tablet (100 mg total) by mouth at bedtime. 07/22/18   Charm RingsLord, Jamison Y, NP    Family History Family History  Problem Relation Age of Onset  . Drug abuse Mother     Social History Social History   Tobacco Use  . Smoking status: Current  Every Day Smoker    Packs/day: 0.50    Types: Cigarettes  . Smokeless tobacco: Never Used  Substance Use Topics  . Alcohol use: Yes    Comment: occ  . Drug use: Yes    Types: Marijuana, Cocaine     Allergies   Patient has no known allergies.   Review of Systems Review of Systems  Musculoskeletal: Positive for arthralgias.  Skin: Negative for wound.  Neurological: Negative for weakness and numbness.     Physical Exam Updated Vital Signs BP 125/80   Pulse 84   Temp 98.5 F (36.9 C) (Oral)   Resp 16   SpO2 99%   Physical Exam Vitals signs and nursing note reviewed.  Constitutional:      General: He is not in acute distress.    Appearance: Normal appearance. He is well-developed.  HENT:     Head: Normocephalic and atraumatic.  Eyes:     General: No scleral icterus.       Right eye: No discharge.        Left eye: No discharge.     Conjunctiva/sclera: Conjunctivae normal.     Pupils: Pupils are equal, round, and reactive to light.  Neck:     Musculoskeletal: Normal range of motion.  Cardiovascular:     Rate and Rhythm: Normal rate.  Pulmonary:     Effort: Pulmonary effort is normal. No respiratory distress.  Abdominal:     General: There is no distension.  Musculoskeletal:     Comments: Left ankle: Diffuse swelling. N/V intact.   Skin:    General: Skin is warm and dry.  Neurological:     Mental Status: He is alert and oriented to person, place, and time.  Psychiatric:        Behavior: Behavior normal.      ED Treatments / Results  Labs (all labs ordered are listed, but only abnormal results are displayed) Labs Reviewed - No data to display  EKG None  Radiology Dg Ankle Complete Left  Result Date: 08/18/2018 CLINICAL DATA:  Left ankle pain after injury playing basketball yesterday. EXAM: LEFT ANKLE COMPLETE - 3+ VIEW COMPARISON:  None. FINDINGS: There is no evidence of fracture, dislocation, or joint effusion. There is no evidence of arthropathy  or other focal bone abnormality. Soft tissue swelling is seen over lateral malleolus. IMPRESSION: No fracture or dislocation is noted. Soft tissue swelling is seen over lateral malleolus. Electronically Signed   By: Lupita RaiderJames  Green Jr, M.D.   On: 08/18/2018 08:58    Procedures Procedures (including critical care time)  Medications Ordered in ED Medications - No data to display   Initial Impression / Assessment and Plan / ED Course  I have reviewed the triage vital signs and the nursing notes.  Pertinent labs & imaging results that were available during my care of the patient were reviewed by me and considered in my medical decision making (see chart for details).  28 year old male presents with request for a boot instead of brace and crutches.  He reports swelling has improved and has been ambulatory.  We will give him a boot for home.  He is advised to elevate the ankle as much as possible to help with swelling.  Final Clinical Impressions(s) / ED Diagnoses   Final diagnoses:  Sprain of right ankle, unspecified ligament, subsequent encounter    ED Discharge Orders    None       Bethel BornGekas, Kelly Marie, PA-C 08/18/18 2016    Sabas SousBero, Michael M, MD 08/19/18 [phone]

## 2018-08-18 NOTE — ED Triage Notes (Signed)
Pt here earlier for left ankle injury.  Requesting a boot at this time.  States the crutches are making him trip and the brace doesn't feel good.

## 2018-08-18 NOTE — Discharge Instructions (Addendum)
Elevate, apply ice for 20 minutes at a time. Weight bear as tolerated.  Take Motrin/Tylenol as needed as directed for pain. Follow up with primary care in 1 week for recheck if not improving.

## 2018-11-05 ENCOUNTER — Encounter (HOSPITAL_COMMUNITY): Payer: Self-pay | Admitting: Emergency Medicine

## 2018-11-05 ENCOUNTER — Emergency Department (HOSPITAL_COMMUNITY)
Admission: EM | Admit: 2018-11-05 | Discharge: 2018-11-05 | Disposition: A | Discharge status: Home / Self Care | Payer: Self-pay | Attending: Emergency Medicine | Admitting: Emergency Medicine

## 2018-11-05 DIAGNOSIS — Z5321 Procedure and treatment not carried out due to patient leaving prior to being seen by health care provider: Secondary | ICD-10-CM | POA: Insufficient documentation

## 2018-11-05 DIAGNOSIS — R222 Localized swelling, mass and lump, trunk: Secondary | ICD-10-CM | POA: Insufficient documentation

## 2018-11-05 HISTORY — DX: Furuncle, unspecified: L02.92

## 2018-11-05 NOTE — ED Triage Notes (Signed)
Pt reports recent boils on legs, states he las one on his left buttock for 3 days.

## 2018-11-05 NOTE — ED Notes (Signed)
Called pt x3, no answer. Pt finally found walking back down parking lot from street.

## 2018-11-05 NOTE — ED Notes (Signed)
Pt not in room upon PA's arrival, pt and belongings gone, pt did not tell staff he was leaving.

## 2018-11-25 ENCOUNTER — Ambulatory Visit (HOSPITAL_COMMUNITY): Payer: Self-pay

## 2018-11-25 ENCOUNTER — Encounter (HOSPITAL_COMMUNITY): Payer: Self-pay | Admitting: Emergency Medicine

## 2018-11-25 ENCOUNTER — Emergency Department (HOSPITAL_COMMUNITY): Payer: Self-pay

## 2018-11-25 ENCOUNTER — Emergency Department (HOSPITAL_COMMUNITY)
Admission: EM | Admit: 2018-11-25 | Discharge: 2018-11-25 | Disposition: A | Discharge status: Home / Self Care | Payer: Self-pay | Attending: Emergency Medicine | Admitting: Emergency Medicine

## 2018-11-25 DIAGNOSIS — F17210 Nicotine dependence, cigarettes, uncomplicated: Secondary | ICD-10-CM | POA: Insufficient documentation

## 2018-11-25 DIAGNOSIS — X58XXXA Exposure to other specified factors, initial encounter: Secondary | ICD-10-CM | POA: Insufficient documentation

## 2018-11-25 DIAGNOSIS — Y92199 Unspecified place in other specified residential institution as the place of occurrence of the external cause: Secondary | ICD-10-CM | POA: Insufficient documentation

## 2018-11-25 DIAGNOSIS — Z79899 Other long term (current) drug therapy: Secondary | ICD-10-CM | POA: Insufficient documentation

## 2018-11-25 DIAGNOSIS — F1910 Other psychoactive substance abuse, uncomplicated: Secondary | ICD-10-CM | POA: Insufficient documentation

## 2018-11-25 DIAGNOSIS — R4182 Altered mental status, unspecified: Secondary | ICD-10-CM | POA: Insufficient documentation

## 2018-11-25 DIAGNOSIS — Y9389 Activity, other specified: Secondary | ICD-10-CM | POA: Insufficient documentation

## 2018-11-25 DIAGNOSIS — S0083XA Contusion of other part of head, initial encounter: Secondary | ICD-10-CM | POA: Insufficient documentation

## 2018-11-25 DIAGNOSIS — Y999 Unspecified external cause status: Secondary | ICD-10-CM | POA: Insufficient documentation

## 2018-11-25 LAB — ETHANOL: Alcohol, Ethyl (B): <10 mg/dL

## 2018-11-25 LAB — RAPID URINE DRUG SCREEN, HOSP PERFORMED
Amphetamines: NOT DETECTED
Barbiturates: NOT DETECTED
Benzodiazepines: POSITIVE — AB
Cocaine: POSITIVE — AB
Opiates: NOT DETECTED
Tetrahydrocannabinol: POSITIVE — AB

## 2018-11-25 LAB — CBC WITH DIFFERENTIAL/PLATELET
Abs Immature Granulocytes: 0.02 K/uL (ref 0.00–0.07)
Basophils Absolute: 0.1 K/uL (ref 0.0–0.1)
Basophils Relative: 1 %
Eosinophils Absolute: 0.1 K/uL (ref 0.0–0.5)
Eosinophils Relative: 1 %
HCT: 44.6 % (ref 39.0–52.0)
Hemoglobin: 14.9 g/dL (ref 13.0–17.0)
Immature Granulocytes: 0 %
Lymphocytes Relative: 30 %
Lymphs Abs: 3 K/uL (ref 0.7–4.0)
MCH: 31.5 pg (ref 26.0–34.0)
MCHC: 33.4 g/dL (ref 30.0–36.0)
MCV: 94.3 fL (ref 80.0–100.0)
Monocytes Absolute: 0.8 K/uL (ref 0.1–1.0)
Monocytes Relative: 8 %
Neutro Abs: 6.1 K/uL (ref 1.7–7.7)
Neutrophils Relative %: 60 %
Platelets: 229 K/uL (ref 150–400)
RBC: 4.73 MIL/uL (ref 4.22–5.81)
RDW: 13.5 % (ref 11.5–15.5)
WBC: 10 K/uL (ref 4.0–10.5)
nRBC: 0 % (ref 0.0–0.2)

## 2018-11-25 LAB — COMPREHENSIVE METABOLIC PANEL WITH GFR
ALT: 22 U/L (ref 0–44)
AST: 40 U/L (ref 15–41)
Albumin: 4 g/dL (ref 3.5–5.0)
Alkaline Phosphatase: 60 U/L (ref 38–126)
Anion gap: 7 (ref 5–15)
BUN: 15 mg/dL (ref 6–20)
CO2: 26 mmol/L (ref 22–32)
Calcium: 8.8 mg/dL — ABNORMAL LOW (ref 8.9–10.3)
Chloride: 106 mmol/L (ref 98–111)
Creatinine, Ser: 0.93 mg/dL (ref 0.61–1.24)
GFR calc Af Amer: >60 mL/min
GFR calc non Af Amer: >60 mL/min
Glucose, Bld: 120 mg/dL — ABNORMAL HIGH (ref 70–99)
Potassium: 3.4 mmol/L — ABNORMAL LOW (ref 3.5–5.1)
Sodium: 139 mmol/L (ref 135–145)
Total Bilirubin: 0.9 mg/dL (ref 0.3–1.2)
Total Protein: 7.1 g/dL (ref 6.5–8.1)

## 2018-11-25 LAB — ACETAMINOPHEN LEVEL: Acetaminophen (Tylenol), Serum: <10 ug/mL — ABNORMAL LOW (ref 10–30)

## 2018-11-25 LAB — SALICYLATE LEVEL: Salicylate Lvl: <7 mg/dL (ref 2.8–30.0)

## 2018-11-25 NOTE — ED Notes (Signed)
Pt awake and eating at this time

## 2018-11-25 NOTE — Discharge Instructions (Addendum)
Stop using drugs.

## 2018-11-25 NOTE — ED Notes (Signed)
Pt resting but unresponsive. Labs drawn. Provider at bedside. VSS

## 2018-11-25 NOTE — ED Notes (Signed)
Patient transported to CT 

## 2018-11-25 NOTE — ED Notes (Signed)
Pt resting. VS rechecked. Provider notified about rectal temp. Will apply more blankets and monitor. Pt in NAD

## 2018-11-25 NOTE — ED Notes (Signed)
Pt resting in bed. VS rechecked. Provider notified of temp. Pt more alert and easier to awaken and in NAD

## 2018-11-25 NOTE — ED Triage Notes (Addendum)
Patient here from [employer], ETOH. Per [employer] patient took unknown amount of Xanax. Hx of substance abuse. Patient response to sternum rub. Aggressive. 1 inch laceration noted to forehead, bleeding controlled.

## 2018-11-25 NOTE — ED Notes (Signed)
Pt lying in bed. VSS. Awaiting CT scan

## 2018-11-25 NOTE — Patient Outreach (Unsigned)
[facility] provided the patient with several substance use recovery resources including residential/outpatient substance use treatment center list, detox center list, NA/AA online meeting list, and [facility] contact information.

## 2018-11-25 NOTE — ED Notes (Signed)
Pt back from CT. Was unresponsive throughout the scan and in transport. VSS

## 2018-11-25 NOTE — ED Provider Notes (Signed)
[facility]-EMERGENCY DEPT Provider Note   CSN: [id number] Arrival date & time: 11/25/18  0048    History   Chief Complaint Chief Complaint  Patient presents with  . Alcohol Intoxication  . Drug Problem    HPI Daniel [name] is a 28 y.o. male.     The history is provided by the patient and medical records.  Alcohol Intoxication   Drug Problem      LEVEL V CAVEAT:  INTOXICATION/SUBSTANCE ABUSE  28 y.o. M with history of bipolar disorder, polysubstance abuse, presenting to the ED for drugs and EtOH.  [facility] has been drinking today and took unknown amount of xanax.  Patient was standing in handcuffs with [employer] when EMS arrived due to combative behavior.  He has been sleeping with EMS, does arouse but becomes somewhat combative if over stimulated.  Does have contusion noted to central forehead, uncertain how this occurred.  Past Medical History:  Diagnosis Date  . Boil     Patient Active Problem List   Diagnosis Date Noted  . Cocaine abuse with cocaine-induced mood disorder (HCC) 07/22/2018  . Bipolar disorder, curr episode mixed, severe, with psychotic features (HCC) 01/30/2017  . Cocaine use disorder, moderate, dependence (HCC) 01/30/2017  . Cannabis use disorder, severe, dependence (HCC) 01/30/2017    History reviewed. No pertinent surgical history.      Home Medications    Prior to Admission medications   Medication Sig Start Date End Date Taking? Authorizing Provider  acetaminophen (TYLENOL) 500 MG tablet Take 1,000 mg by mouth every 6 (six) hours as needed for headache (pain).    [provider]  citalopram (CELEXA) 10 MG tablet Take 2 tablets (20 mg total) by mouth daily. 07/23/18   Charm RingsLord, Jamison Y, NP  hydrOXYzine (ATARAX/VISTARIL) 25 MG tablet Take 1 tablet (25 mg total) by mouth 3 (three) times daily. 07/22/18   Charm RingsLord, Jamison Y, NP  traZODone (DESYREL) 100 MG tablet Take 1 tablet (100 mg total) by mouth at  bedtime. 07/22/18   Charm RingsLord, Jamison Y, NP    Family History Family History  Problem Relation Age of Onset  . Drug abuse Mother     Social History Social History   Tobacco Use  . Smoking status: Current Every Day Smoker    Packs/day: 0.50    Types: Cigarettes  . Smokeless tobacco: Never Used  Substance Use Topics  . Alcohol use: Yes    Comment: occ  . Drug use: Not Currently    Types: Marijuana, Cocaine     Allergies   Patient has no known allergies.   Review of Systems Review of Systems  Unable to perform ROS: Other     Physical Exam Updated Vital Signs BP (!) 128/94 (BP Location: Left Arm)   Pulse 84   Resp 14   SpO2 100%   Physical Exam Vitals signs and nursing note reviewed.  Constitutional:      Appearance: He is well-developed.  HENT:     Head: Normocephalic and atraumatic.     Comments: Contusion to central forehead noted with hematoma, there is abrasion without any active bleeding, no gaping wounds    Mouth/Throat:     Comments: Protecting airway Eyes:     Conjunctiva/sclera: Conjunctivae normal.     Pupils: Pupils are equal, round, and reactive to light.  Neck:     Musculoskeletal: Normal range of motion.  Cardiovascular:     Rate and Rhythm: Normal rate and regular rhythm.  Heart sounds: Normal heart sounds.  Pulmonary:     Effort: Pulmonary effort is normal.     Breath sounds: Normal breath sounds.  Abdominal:     General: Bowel sounds are normal.     Palpations: Abdomen is soft.  Musculoskeletal: Normal range of motion.  Skin:    General: Skin is warm and dry.  Neurological:     Comments: Drowsy, responds to sternal rub, moans and will spontaneously move arms and legs afterwards      ED Treatments / Results  Labs (all labs ordered are listed, but only abnormal results are displayed) Labs Reviewed  COMPREHENSIVE METABOLIC PANEL - Abnormal; Notable for the following components:      Result Value   Potassium 3.4 (*)    Glucose,  Bld 120 (*)    Calcium 8.8 (*)    All other components within normal limits  RAPID URINE DRUG SCREEN, HOSP PERFORMED - Abnormal; Notable for the following components:   Cocaine POSITIVE (*)    Benzodiazepines POSITIVE (*)    Tetrahydrocannabinol POSITIVE (*)    All other components within normal limits  ACETAMINOPHEN LEVEL - Abnormal; Notable for the following components:   Acetaminophen (Tylenol), Serum <10 (*)    All other components within normal limits  CBC WITH DIFFERENTIAL/PLATELET  ETHANOL  SALICYLATE LEVEL    EKG None  Radiology Ct Head Wo Contrast  Result Date: 11/25/2018 CLINICAL DATA:  Fall with head trauma EXAM: CT HEAD WITHOUT CONTRAST TECHNIQUE: Contiguous axial images were obtained from the base of the skull through the vertex without intravenous contrast. COMPARISON:  None. FINDINGS: Brain: There is no mass, hemorrhage or extra-axial collection. The size and configuration of the ventricles and extra-axial CSF spaces are normal. The brain parenchyma is normal, without acute or chronic infarction. Vascular: No abnormal hyperdensity of the major intracranial arteries or dural venous sinuses. No intracranial atherosclerosis. Skull: The visualized skull base, calvarium and extracranial soft tissues are normal. Sinuses/Orbits: No fluid levels or advanced mucosal thickening of the visualized paranasal sinuses. No mastoid or middle ear effusion. The orbits are normal. IMPRESSION: Normal head CT. Electronically Signed   By: Deatra Robinson M.D.   On: 11/25/2018 02:59    Procedures Procedures (including critical care time)  Medications Ordered in ED Medications - No data to display   Initial Impression / Assessment and Plan / ED Course  I have reviewed the triage vital signs and the nursing notes.  Pertinent labs & imaging results that were available during my care of the patient were reviewed by me and considered in my medical decision making (see chart for details).  28  y.o. M here with concern of polysubstance abuse from [facility].  He is drowsy but arouses to sternal rub.  He is maintaining his airway and vitals are stable.  Has area of contusion to mid-forehead with abrasion noted but no active bleeding.  Small hematoma.  EMS unsure how this happened.  Plan for screening labs.  Head CT will be obtained given current mental state.  Will monitor.  Labs as above-- UDS + for cocaine, THC, and benzos.  This is likely source of his drowsiness.  CT is negative.  Will monitor here until metabolized.  6:54 AM Patient still very drowsy.  Will need to metabolize further until awake, alert, ambulatory.  Vitals remain stable.  Will sign out to oncoming provider for continued monitoring.  Anticipate discharge.  Final Clinical Impressions(s) / ED Diagnoses   Final diagnoses:  Polysubstance  abuse [facility])    ED Discharge Orders    None       Garlon Hatchet, PA-C 11/25/18 1610    Zadie Rhine, MD 11/25/18 2308

## 2018-11-25 NOTE — ED Notes (Signed)
Pt resting in bed. Rectal temp checked, provider notified and warm blankets applied. Pt beginning to wake up and was able to give a urine sample without a catheter.

## 2018-11-27 ENCOUNTER — Encounter (HOSPITAL_COMMUNITY): Payer: Self-pay

## 2018-11-27 ENCOUNTER — Emergency Department (HOSPITAL_COMMUNITY)
Admission: EM | Admit: 2018-11-27 | Discharge: 2018-11-28 | Disposition: A | Discharge status: Other Acute Inpatient Hospital | Payer: Self-pay | Attending: Emergency Medicine | Admitting: Emergency Medicine

## 2018-11-27 ENCOUNTER — Other Ambulatory Visit: Payer: Self-pay

## 2018-11-27 DIAGNOSIS — Z79899 Other long term (current) drug therapy: Secondary | ICD-10-CM | POA: Insufficient documentation

## 2018-11-27 DIAGNOSIS — F17210 Nicotine dependence, cigarettes, uncomplicated: Secondary | ICD-10-CM | POA: Insufficient documentation

## 2018-11-27 DIAGNOSIS — R45851 Suicidal ideations: Secondary | ICD-10-CM

## 2018-11-27 DIAGNOSIS — F333 Major depressive disorder, recurrent, severe with psychotic symptoms: Secondary | ICD-10-CM | POA: Insufficient documentation

## 2018-11-27 LAB — RAPID URINE DRUG SCREEN, HOSP PERFORMED
Amphetamines: NOT DETECTED
Barbiturates: NOT DETECTED
Benzodiazepines: NOT DETECTED
Cocaine: POSITIVE — AB
Opiates: NOT DETECTED
Tetrahydrocannabinol: NOT DETECTED

## 2018-11-27 LAB — COMPREHENSIVE METABOLIC PANEL
ALT: 20 U/L (ref 0–44)
AST: 37 U/L (ref 15–41)
Albumin: 4.2 g/dL (ref 3.5–5.0)
Alkaline Phosphatase: 52 U/L (ref 38–126)
Anion gap: 12 (ref 5–15)
BUN: 9 mg/dL (ref 6–20)
CO2: 22 mmol/L (ref 22–32)
Calcium: 9.2 mg/dL (ref 8.9–10.3)
Chloride: 103 mmol/L (ref 98–111)
Creatinine, Ser: 1.03 mg/dL (ref 0.61–1.24)
GFR calc Af Amer: >60 mL/min (ref >60)
GFR calc non Af Amer: >60 mL/min (ref >60)
Glucose, Bld: 77 mg/dL (ref 70–99)
Potassium: 3.4 mmol/L — ABNORMAL LOW (ref 3.5–5.1)
Sodium: 137 mmol/L (ref 135–145)
Total Bilirubin: 1.2 mg/dL (ref 0.3–1.2)
Total Protein: 7.4 g/dL (ref 6.5–8.1)

## 2018-11-27 LAB — CBC
HCT: 47.5 % (ref 39.0–52.0)
Hemoglobin: 16.3 g/dL (ref 13.0–17.0)
MCH: 31.3 pg (ref 26.0–34.0)
MCHC: 34.3 g/dL (ref 30.0–36.0)
MCV: 91.2 fL (ref 80.0–100.0)
Platelets: 256 10*3/uL (ref 150–400)
RBC: 5.21 MIL/uL (ref 4.22–5.81)
RDW: 12.7 % (ref 11.5–15.5)
WBC: 9.4 10*3/uL (ref 4.0–10.5)
nRBC: 0 % (ref 0.0–0.2)

## 2018-11-27 LAB — ACETAMINOPHEN LEVEL: Acetaminophen (Tylenol), Serum: <10 ug/mL — ABNORMAL LOW (ref 10–30)

## 2018-11-27 LAB — ETHANOL: Alcohol, Ethyl (B): 107 mg/dL — ABNORMAL HIGH (ref <10)

## 2018-11-27 LAB — SALICYLATE LEVEL: Salicylate Lvl: <7 mg/dL (ref 2.8–30.0)

## 2018-11-27 MED ORDER — POTASSIUM CHLORIDE CRYS ER 20 MEQ PO TBCR
40.0000 meq | EXTENDED_RELEASE_TABLET | Freq: Once | ORAL | Status: AC
  Administered 2018-11-27: 23:00:00 40 meq via ORAL
  Filled 2018-11-27: qty 2

## 2018-11-27 NOTE — ED Provider Notes (Signed)
[facility] EMERGENCY DEPARTMENT Provider Note   CSN: [id number] Arrival date & time: 11/27/18  2148    History   Chief Complaint Chief Complaint  Patient presents with  . Suicidal    HPI Daniel [name] is a 28 y.o. male.     Patient presents to the emergency department with a chief complaint of depression and suicidal thoughts.  He states that he is depressed because his daughter is in foster care and he is involved in a custody battle.  States that he has been using drugs to cope with his symptoms.  States that he now feels suicidal, but does not have a plan.  Denies auditory or visual hallucinations.  Denies any other associated symptoms, specifically fever, chills, cough, or shortness of breath.  The history is provided by the patient. No language interpreter was used.    Past Medical History:  Diagnosis Date  . Boil     Patient Active Problem List   Diagnosis Date Noted  . Cocaine abuse with cocaine-induced mood disorder (HCC) 07/22/2018  . Bipolar disorder, curr episode mixed, severe, with psychotic features (HCC) 01/30/2017  . Cocaine use disorder, moderate, dependence (HCC) 01/30/2017  . Cannabis use disorder, severe, dependence (HCC) 01/30/2017    History reviewed. No pertinent surgical history.      Home Medications    Prior to Admission medications   Medication Sig Start Date End Date Taking? Authorizing Provider  acetaminophen (TYLENOL) 500 MG tablet Take 1,000 mg by mouth every 6 (six) hours as needed for headache (pain).    [provider]  citalopram (CELEXA) 10 MG tablet Take 2 tablets (20 mg total) by mouth daily. 07/23/18   Charm Rings, NP  hydrOXYzine (ATARAX/VISTARIL) 25 MG tablet Take 1 tablet (25 mg total) by mouth 3 (three) times daily. 07/22/18   Charm Rings, NP  traZODone (DESYREL) 100 MG tablet Take 1 tablet (100 mg total) by mouth at bedtime. 07/22/18   Charm Rings, NP    Family History Family  History  Problem Relation Age of Onset  . Drug abuse Mother     Social History Social History   Tobacco Use  . Smoking status: Current Every Day Smoker    Packs/day: 0.50    Types: Cigarettes  . Smokeless tobacco: Never Used  Substance Use Topics  . Alcohol use: Yes    Comment: occ  . Drug use: Not Currently    Types: Marijuana, Cocaine     Allergies   Patient has no known allergies.   Review of Systems Review of Systems  All other systems reviewed and are negative.    Physical Exam Updated Vital Signs BP (!) 149/98 (BP Location: Right Arm)   Pulse 100   Temp 98.6 F (37 C) (Oral)   Resp 16   Ht 6' (1.829 m)   Wt 72.6 kg   SpO2 98%   BMI 21.70 kg/m   Physical Exam Vitals signs and nursing note reviewed.  Constitutional:      Appearance: He is well-developed.  HENT:     Head: Normocephalic and atraumatic.  Eyes:     Conjunctiva/sclera: Conjunctivae normal.  Neck:     Musculoskeletal: Neck supple.  Cardiovascular:     Rate and Rhythm: Normal rate and regular rhythm.     Heart sounds: No murmur.  Pulmonary:     Effort: Pulmonary effort is normal. No respiratory distress.     Breath sounds: Normal breath sounds.  Abdominal:  Palpations: Abdomen is soft.     Tenderness: There is no abdominal tenderness.  Skin:    General: Skin is warm and dry.  Neurological:     Mental Status: He is alert and oriented to person, place, and time.  Psychiatric:        Behavior: Behavior normal.        Thought Content: Thought content normal.        Judgment: Judgment normal.     Comments: Withdrawn      ED Treatments / Results  Labs (all labs ordered are listed, but only abnormal results are displayed) Labs Reviewed  CBC  COMPREHENSIVE METABOLIC PANEL  ETHANOL  SALICYLATE LEVEL  ACETAMINOPHEN LEVEL  RAPID URINE DRUG SCREEN, HOSP PERFORMED    EKG None  Radiology No results found.  Procedures Procedures (including critical care time)   Medications Ordered in ED Medications - No data to display   Initial Impression / Assessment and Plan / ED Course  I have reviewed the triage vital signs and the nursing notes.  Pertinent labs & imaging results that were available during my care of the patient were reviewed by me and considered in my medical decision making (see chart for details).        Patient with depressed thoughts, suicidal thoughts, and requesting help.  No clear plan.  Has had significant life stressors.  Consult TTS.  Inpatient; working on placement.  Final Clinical Impressions(s) / ED Diagnoses   Final diagnoses:  Suicidal thoughts    ED Discharge Orders    None       Reise Hietala, PA-C 04/23/Roxy Horseman20 [id number]    Gerhard MunchLockwood, Emalyn Schou, MD 11/28/18 [phone]

## 2018-11-27 NOTE — ED Triage Notes (Signed)
Pt arrives via [employer] for eval of SI/HI w/out plan

## 2018-11-27 NOTE — ED Notes (Signed)
Pt cell phone locked up with security.

## 2018-11-27 NOTE — ED Notes (Signed)
Pt alert and oriented pt denies any pain. Pt c/o of si hi pt contracts safety. Pt reports having auditory hallucinations. Pt reports voices are telling him to hurt himself. Pt sitter noted at the bedside. Pt safe will continue to monitor.

## 2018-11-28 ENCOUNTER — Inpatient Hospital Stay (HOSPITAL_COMMUNITY)
Admission: AD | Admit: 2018-11-28 | Discharge: 2018-12-03 | DRG: 885 | Disposition: A | Discharge status: Home / Self Care | Payer: No Typology Code available for payment source | Source: Intra-hospital | Attending: Psychiatry | Admitting: Psychiatry

## 2018-11-28 DIAGNOSIS — F1290 Cannabis use, unspecified, uncomplicated: Secondary | ICD-10-CM | POA: Diagnosis present

## 2018-11-28 DIAGNOSIS — F17210 Nicotine dependence, cigarettes, uncomplicated: Secondary | ICD-10-CM | POA: Diagnosis present

## 2018-11-28 DIAGNOSIS — Z813 Family history of other psychoactive substance abuse and dependence: Secondary | ICD-10-CM | POA: Diagnosis not present

## 2018-11-28 DIAGNOSIS — F322 Major depressive disorder, single episode, severe without psychotic features: Secondary | ICD-10-CM | POA: Diagnosis present

## 2018-11-28 DIAGNOSIS — F1310 Sedative, hypnotic or anxiolytic abuse, uncomplicated: Secondary | ICD-10-CM | POA: Diagnosis present

## 2018-11-28 DIAGNOSIS — F10239 Alcohol dependence with withdrawal, unspecified: Secondary | ICD-10-CM | POA: Diagnosis present

## 2018-11-28 DIAGNOSIS — Z590 Homelessness: Secondary | ICD-10-CM

## 2018-11-28 DIAGNOSIS — F515 Nightmare disorder: Secondary | ICD-10-CM | POA: Diagnosis present

## 2018-11-28 DIAGNOSIS — R45851 Suicidal ideations: Secondary | ICD-10-CM | POA: Diagnosis present

## 2018-11-28 DIAGNOSIS — F1410 Cocaine abuse, uncomplicated: Secondary | ICD-10-CM | POA: Diagnosis present

## 2018-11-28 MED ORDER — QUETIAPINE FUMARATE 100 MG PO TABS
100.0000 mg | ORAL_TABLET | Freq: Every day | ORAL | Status: DC
  Administered 2018-11-28 – 2018-12-02 (×5): 100 mg via ORAL
  Filled 2018-11-28 (×5): qty 1
  Filled 2018-11-28: qty 14
  Filled 2018-11-28 (×2): qty 1

## 2018-11-28 MED ORDER — LORAZEPAM 1 MG PO TABS
1.0000 mg | ORAL_TABLET | Freq: Four times a day (QID) | ORAL | Status: DC | PRN
  Administered 2018-12-01: 1 mg via ORAL
  Filled 2018-11-28 (×2): qty 1

## 2018-11-28 MED ORDER — HYDROXYZINE HCL 25 MG PO TABS
25.0000 mg | ORAL_TABLET | Freq: Three times a day (TID) | ORAL | Status: DC
  Administered 2018-11-28 – 2018-12-03 (×13): 25 mg via ORAL
  Filled 2018-11-28 (×2): qty 1
  Filled 2018-11-28: qty 20
  Filled 2018-11-28 (×10): qty 1
  Filled 2018-11-28: qty 20
  Filled 2018-11-28 (×4): qty 1
  Filled 2018-11-28: qty 20
  Filled 2018-11-28: qty 1
  Filled 2018-11-28: qty 20
  Filled 2018-11-28 (×2): qty 1

## 2018-11-28 MED ORDER — ZIPRASIDONE MESYLATE 20 MG IM SOLR
20.0000 mg | Freq: Once | INTRAMUSCULAR | Status: DC
  Filled 2018-11-28: qty 20

## 2018-11-28 MED ORDER — FOLIC ACID 1 MG PO TABS
1.0000 mg | ORAL_TABLET | Freq: Every day | ORAL | Status: DC
  Administered 2018-11-28 – 2018-12-03 (×6): 1 mg via ORAL
  Filled 2018-11-28 (×8): qty 1

## 2018-11-28 MED ORDER — VITAMIN B-1 100 MG PO TABS
100.0000 mg | ORAL_TABLET | Freq: Every day | ORAL | Status: DC
  Administered 2018-11-28 – 2018-12-03 (×6): 100 mg via ORAL
  Filled 2018-11-28 (×8): qty 1

## 2018-11-28 MED ORDER — CITALOPRAM HYDROBROMIDE 20 MG PO TABS
20.0000 mg | ORAL_TABLET | Freq: Every day | ORAL | Status: DC
  Administered 2018-11-28 – 2018-11-30 (×3): 20 mg via ORAL
  Filled 2018-11-28 (×5): qty 1

## 2018-11-28 MED ORDER — TRAZODONE HCL 100 MG PO TABS
100.0000 mg | ORAL_TABLET | Freq: Every day | ORAL | Status: DC
  Administered 2018-11-28 – 2018-12-02 (×5): 100 mg via ORAL
  Filled 2018-11-28 (×7): qty 1
  Filled 2018-11-28: qty 14

## 2018-11-28 NOTE — ED Notes (Signed)
Patient sleeping most of the morning.  Endorses suicidal thoughts with a plan to run out in traffic.  Reports he is depressed and he needs help.

## 2018-11-28 NOTE — Progress Notes (Signed)
Pt accepted to [facility], Bed 504--2 WHEN BED BECOMES AVAILABLE Donell Sievert, PA is the accepting provider.  Malvin Johns, MD. is the attending provider.  Call report to [phone]  @ [facility] ED notified.   Pt is Voluntary.  Pt may be transported by [facility] Pt scheduled  to arrive at [facility] WHEN BED BECOMES AVAILABLE  Carney Bern T. Kaylyn Lim, MSW, LCSWA Disposition Clinical Social Work [phone] (cell) [phone] (office)

## 2018-11-28 NOTE — ED Notes (Signed)
Attempted to get patient's cell phone from security.  Phone had already been signed out, not in security's locker.

## 2018-11-28 NOTE — ED Notes (Signed)
Patient was given a snack and drink, a Regular Diet was ordered for Lunch.
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
## 2018-11-28 NOTE — Progress Notes (Signed)
Pt admitted to [facility]. Pt pleasant and cooperative upon admission. He reports that he has been feeling, depressed, overwhelmed with current life stressors and he ''just needed some help'' patient states that he has been having some auditory hallucinations, and really struggles getting to sleep with racing thoughts, and this ''makes it so hard to sleep , then I get worse'' Patient denies any current auditory hallucinations. Pt denies SI/HI on admit, able to contract for safety.  Patient was searched, no contraband found, noted one small laceration to forehead, open to air , non draining. Patient was oriented to unit and ward rules. He verbalized understanding. Pt appears in no acute distress, report given to primary RN.

## 2018-11-29 NOTE — [facility] Counselor (Signed)
Adult Comprehensive Assessment  Patient ZO:XWRUEAVWU:Daniel [name],maleDOB:18-Mar-1991,28 y.o.JWJ:[id number]  Information Source: Information source: Patient Patient's goal for ongoing recovery: "I want to feel better." Wants to get stable and work toward getting custody of his infant daughter.   Current Stressors: Educational / Learning stressors: None reported Employment / Job issues: Unemployed. Patient reports he was working at [employer] but his baby's mom kept calling him at work and he was fired. Has a hard time finding a new job now because he doesn't have a photo ID. Family Relationships: No real family relationships. Has a 914 month old daughter in foster care, the mom tested positive for cocaine when the baby was born. Patient had weekly visits with his daughter, but those are suspended due to COVID 19. He can still see his daughter through Skype, but it isn't the same. Financial / Lack of resources (include bankruptcy): No income, no insurance Housing / Lack of housing: Was living with a roommate, not sure if he can return because he can't pay rent.  Physical health (include injuries & life threatening diseases): Trouble sleeping Social relationships: limited social support, relationship issues with the mother of his child Substance abuse: Cocaine use and ETOH Bereavement / Loss: Grandmother (who raised patient) died 2 years ago, father died 2 weeks ago.  Living/Environment/Situation: Living Arrangements[place] roommate How long has patient lived in current situation?: 3 months What is atmosphere in current home: Comfortable  Family History: Marital status: Single Does patient have children?: No  Childhood History: By whom was/is the patient raised?: Grandparents Description of patient's relationship with caregiver when they were a child: good relationship with grandmother Patient's  description of current relationship with people who raised him/her: grandmother recently passed away Does patient have siblings?: Yes Number of Siblings: 3 Description of patient's current relationship with siblings: awkward relationship, not as close as they should be Did patient suffer any verbal/emotional/physical/sexual abuse as a child?: No Did patient suffer from severe childhood neglect?: No Has patient ever been sexually abused/assaulted/raped as an adolescent or adult?: No Was the patient ever a victim of a crime or a disaster?: No Witnessed domestic violence?: No Has patient been effected by domestic violence as an adult?: No  Education: Highest grade of school patient has completed: GED Currently a Consulting civil engineerstudent?: No Learning disability?: No  Employment/Work Situation: Employment situation: Unemployed Patient's job has been impacted by current illness: No What is the longest time patient has a held a job?: 6942yrs Where was the patient employed at that time[place] Has patient ever been in the [employer] and Companymilitary?: No Has patient ever served in combat?: No Did You Receive Any Psychiatric Treatment/Services While in Equities traderthe Military?: No Are There Guns or Other Weapons in Your Home?: No  Financial Resources: Surveyor, quantityinancial resources: No income Does patient have a Lawyerrepresentative payee or guardian?: No  Alcohol/Substance Abuse: What has been your use of drugs/alcohol within the last 12 months?: ocassional cocaine and ETOH use If attempted suicide, did drugs/alcohol play a role in this?: No Alcohol/Substance Abuse Treatment Hx: Denies past history Has alcohol/substance abuse ever caused legal problems?: No, reports he has a criminal record but not substance use related.  Social Support System: Conservation officer, natureatient's Community Support System: Fair Describe Community Support System: Roommate, mother of  his child is around but it not a positive support. Type of  faith/religion: Ephriam Knuckles How does patient's faith help to cope with current illness?: believing in God gives strength  Leisure/Recreation: Leisure and Hobbies: working, wants to spend time with his baby  Strengths/Needs: What things does the patient do well?: read, communicating effectively, good with hands, wants to be a good/involved dad  Discharge Plan: Does patient have access to transportation?: Yes Will patient be returning to same living situation after discharge?: Unsure, CSW provided patient with a list of [employer] in [place] Currently receiving community mental health services: No If no, would patient like referral for services when discharged?: Yes, receptive to [employer] of the [place]. Does patient have financial barriers related to discharge medications?: Yes, no income or insurance                    Summary/Recommendations:   Summary and Recommendations (to be completed by the evaluator):  Daniel [name] is a 28 year old male with a diagnosis of Cocaine Use Disorder and Bipolar Disorder, current episode mixed. Pt presented to the hospital with thoughts of suicide and increased depression.Pt reports primary trigger(s) for admission include not being able to visit his infant daughter in the foster care system due to COVID 19, unemployment, the recent death of his father, and substance use.Patient will benefit from crisis stabilization, medication evaluation, group therapy and psycho education in addition to case management for discharge planning. At discharge it is recommended that Pt remain compliant with established discharge plan and continued treatment.   Daniel [name]. 11/29/2018

## 2018-11-29 NOTE — Tx Team (Signed)
Interdisciplinary Treatment and Diagnostic Plan Update  11/29/2018 Time of Session: 9:00am Daniel [name] MRN: [id number]  Principal Diagnosis: <principal problem not specified>  Secondary Diagnoses: Active Problems:   MDD (major depressive disorder), severe (HCC)   Current Medications:  Current Facility-Administered Medications  Medication Dose Route Frequency Provider Last Rate Last Dose  . citalopram (CELEXA) tablet 20 mg  20 mg Oral Daily Rankin, Shuvon B, NP   20 mg at 11/29/18 0940  . folic acid (FOLVITE) tablet 1 mg  1 mg Oral Daily Antonieta Pert, MD   1 mg at 11/29/18 0940  . hydrOXYzine (ATARAX/VISTARIL) tablet 25 mg  25 mg Oral TID Rankin, Shuvon B, NP   25 mg at 11/29/18 0940  . LORazepam (ATIVAN) tablet 1 mg  1 mg Oral Q6H PRN Antonieta Pert, MD      . QUEtiapine (SEROQUEL) tablet 100 mg  100 mg Oral QHS Antonieta Pert, MD   100 mg at 11/28/18 2052  . thiamine (VITAMIN B-1) tablet 100 mg  100 mg Oral Daily Antonieta Pert, MD   100 mg at 11/29/18 0940  . traZODone (DESYREL) tablet 100 mg  100 mg Oral QHS Rankin, Shuvon B, NP   100 mg at 11/28/18 2052  . ziprasidone (GEODON) injection 20 mg  20 mg Intramuscular Once Antonieta Pert, MD       PTA Medications: Medications Prior to Admission  Medication Sig Dispense Refill Last Dose  . citalopram (CELEXA) 10 MG tablet Take 2 tablets (20 mg total) by mouth daily. (Patient not taking: Reported on 11/28/2018) 60 tablet 0 Not Taking at Unknown time  . hydrOXYzine (ATARAX/VISTARIL) 25 MG tablet Take 1 tablet (25 mg total) by mouth 3 (three) times daily. (Patient not taking: Reported on 11/28/2018) 60 tablet 0 Not Taking at Unknown time  . traZODone (DESYREL) 100 MG tablet Take 1 tablet (100 mg total) by mouth at bedtime. (Patient not taking: Reported on 11/28/2018) 30 tablet 1 Not Taking at Unknown time    Patient Stressors:    Patient Strengths:    Treatment Modalities: Medication Management, Group therapy,  Case management,  1 to 1 session with clinician, Psychoeducation, Recreational therapy.   Physician Treatment Plan for Primary Diagnosis: <principal problem not specified> Long Term Goal(s): Improvement in symptoms so as ready for discharge Improvement in symptoms so as ready for discharge   Short Term Goals: Ability to identify changes in lifestyle to reduce recurrence of condition will improve Ability to maintain clinical measurements within normal limits will improve  Medication Management: Evaluate patient's response, side effects, and tolerance of medication regimen.  Therapeutic Interventions: 1 to 1 sessions, Unit Group sessions and Medication administration.  Evaluation of Outcomes: Progressing  Physician Treatment Plan for Secondary Diagnosis: Active Problems:   MDD (major depressive disorder), severe (HCC)  Long Term Goal(s): Improvement in symptoms so as ready for discharge Improvement in symptoms so as ready for discharge   Short Term Goals: Ability to identify changes in lifestyle to reduce recurrence of condition will improve Ability to maintain clinical measurements within normal limits will improve     Medication Management: Evaluate patient's response, side effects, and tolerance of medication regimen.  Therapeutic Interventions: 1 to 1 sessions, Unit Group sessions and Medication administration.  Evaluation of Outcomes: Progressing   RN Treatment Plan for Primary Diagnosis: <principal problem not specified> Long Term Goal(s): Knowledge of disease and therapeutic regimen to maintain health will improve  Short Term Goals: Ability to remain free from  injury will improve, Ability to verbalize frustration and anger appropriately will improve, Ability to identify and develop effective coping behaviors will improve and Compliance with prescribed medications will improve  Medication Management: RN will administer medications as ordered by provider, will assess and  evaluate patient's response and provide education to patient for prescribed medication. RN will report any adverse and/or side effects to prescribing provider.  Therapeutic Interventions: 1 on 1 counseling sessions, Psychoeducation, Medication administration, Evaluate responses to treatment, Monitor vital signs and CBGs as ordered, Perform/monitor CIWA, COWS, AIMS and Fall Risk screenings as ordered, Perform wound care treatments as ordered.  Evaluation of Outcomes: Progressing   LCSW Treatment Plan for Primary Diagnosis: <principal problem not specified> Long Term Goal(s): Safe transition to appropriate next level of care at discharge, Engage patient in therapeutic group addressing interpersonal concerns.  Short Term Goals: Engage patient in aftercare planning with referrals and resources, Increase social support, Identify triggers associated with mental health/substance abuse issues and Increase skills for wellness and recovery  Therapeutic Interventions: Assess for all discharge needs, 1 to 1 time with Social worker, Explore available resources and support systems, Assess for adequacy in community support network, Educate family and significant other(s) on suicide prevention, Complete Psychosocial Assessment, Interpersonal group therapy.  Evaluation of Outcomes: Progressing   Progress in Treatment: Attending groups: Yes. Participating in groups: Yes. Taking medication as prescribed: Yes. Toleration medication: Yes. Family/Significant other contact made: No, will contact:  supports if consents are granted. Patient understands diagnosis: Yes. Discussing patient identified problems/goals with staff: Yes. Medical problems stabilized or resolved: Yes. Denies suicidal/homicidal ideation: No. Issues/concerns per patient self-inventory: Yes.  New problem(s) identified: Yes, Describe:  homelessness, financial stressors  New Short Term/Long Term Goal(s): detox, medication management for mood  stabilization; elimination of SI thoughts; development of comprehensive mental wellness/sobriety plan.  Patient Goals:  Seeking treatment for substance use and depression  Discharge Plan or Barriers: CSW continuing to assess for appropriate referrals.   Reason for Continuation of Hospitalization: Anxiety Depression Suicidal ideation  Estimated Length of Stay: 3-5 days  Attendees: Patient: 11/29/2018 1:09 PM  Physician:  11/29/2018 1:09 PM  Nursing:  11/29/2018 1:09 PM  RN Care Manager: 11/29/2018 1:09 PM  Social Worker: Enid Cutter, LCSWA 11/29/2018 1:09 PM  Recreational Therapist:  11/29/2018 1:09 PM  Other:  11/29/2018 1:09 PM  Other:  11/29/2018 1:09 PM  Other: 11/29/2018 1:09 PM    Scribe for Treatment Team: Darreld Mclean, LCSWA 11/29/2018 1:09 PM

## 2018-11-29 NOTE — [facility] Suicide Risk Assessment (Signed)
[facility] INPATIENT:  Family/Significant Other Suicide Prevention Education  Suicide Prevention Education:  Patient Refusal for Family/Significant Other Suicide Prevention Education: The patient Daniel [name] has refused to provide written consent for family/significant other to be provided Family/Significant Other Suicide Prevention Education during admission and/or prior to discharge.  Physician notified.  SPE reviewed with patient.  Darreld Mclean 11/29/2018, 4:21 PM

## 2018-11-29 NOTE — Progress Notes (Signed)
D: Pt denies SI/HI, +ve AVH- better . Pt is pleasant and cooperative. Pt stated he was having a lot of stress before coming in with his baby's mother , dad recently died. Pt stated he wanted to get ID and SS number.   A: Pt was offered support and encouragement. Pt was given scheduled medications. Pt was encourage to attend groups. Q 15 minute checks were done for safety.   R:Pt attends groups and interacts well with peers and staff. Pt is taking medication. Pt has no complaints.Pt receptive to treatment and safety maintained on unit.

## 2018-11-29 NOTE — Progress Notes (Signed)
D: Patient visible on the milieu interacting appropriately with staff and peers. Patient endorses feelings of depression, though at this time denies any feelings of anxiety or suicidal thoughts.   A Routine safety checks conducted every 15 minutes per unit protocol. Encouraged to notify if thoughts of harm toward self or others arise. Patient agrees.  R: Patient remains safe at this time. Patient verbally contracts for safety. WiIl continue to monitor.

## 2018-11-29 NOTE — H&P (Signed)
Psychiatric Admission Assessment Adult  Patient Identification: Daniel [name] MRN:  [id number] Date of Evaluation:  11/29/2018 Chief Complaint:  MDD, Severe with psychotic features Polysubstance Use Disorder Principal Diagnosis: Depression, alcohol abuse cocaine abuse homelessness recent combativeness after ingestion of alcohol cocaine and Xanax Diagnosis:  Active Problems:   MDD (major depressive disorder), severe (HCC)  History of Present Illness:  This is a repeat admission for Daniel [name] he is 28 years of age he had been abusing alcohol and Xanax and wound up being combative requiring police intervention and was screened through the emerge department and released on 4/20 He represented on 4/22 reporting multiple symptoms suicidal thoughts, homicidal thoughts auditory and visual hallucinations and requesting help with addictions clearly he is partly motivated by issues of secondary gain due to homelessness he is not combative though he is generally passive poorly cooperative but denies wanting to harm himself here understands what contracting is. Evasive about the exact amount of substances he is abusing drug screen does show cocaine blood alcohol was 107 on presentation 4/22 and again drug screen showed cocaine on 422 as well but we do not have a current 1  Currently alert in bed no eye contact little bit irritable no thoughts of harming self today contracting here only denies hallucinations when I questioned him clearly issues of secondary gain are prominent   Associated Signs/Symptoms: Depression Symptoms:  psychomotor retardation, (Hypo) Manic Symptoms:  Impulsivity, Anxiety Symptoms:  n/a Psychotic Symptoms:  malingers PTSD Symptoms: NA Total Time spent with patient: 45 minutes  Past Psychiatric History: extensive substance abuse  Is the patient at risk to self? Yes.    Has the patient been a risk to self in the past 6 months? Yes.    Has the patient been a risk to self  within the distant past? Yes.    Is the patient a risk to others? Yes.    Has the patient been a risk to others in the past 6 months? No.  Has the patient been a risk to others within the distant past? No.   Alcohol Screening:   Substance Abuse History in the last 12 months:  Yes.   Consequences of Substance Abuse: NA Previous Psychotropic Medications: Yes  Psychological Evaluations: No  Past Medical History:  Past Medical History:  Diagnosis Date  . Boil    No past surgical history on file. Family History:  Family History  Problem Relation Age of Onset  . Drug abuse Mother   Tobacco Screening:   Social History:  Social History   Substance and Sexual Activity  Alcohol Use Yes   Comment: occ     Social History   Substance and Sexual Activity  Drug Use Not Currently  . Types: Marijuana, Cocaine    Additional Social History:                           Allergies:  No Known Allergies Lab Results:  Results for orders placed or performed during the hospital encounter of 11/27/18 (from the past 48 hour(s))  Rapid urine drug screen (hospital performed)     Status: Abnormal   Collection Time: 11/27/18 10:00 PM  Result Value Ref Range   Opiates NONE DETECTED NONE DETECTED   Cocaine POSITIVE (A) NONE DETECTED   Benzodiazepines NONE DETECTED NONE DETECTED   Amphetamines NONE DETECTED NONE DETECTED   Tetrahydrocannabinol NONE DETECTED NONE DETECTED   Barbiturates NONE DETECTED NONE DETECTED    Comment: (NOTE)  DRUG SCREEN FOR MEDICAL PURPOSES ONLY.  IF CONFIRMATION IS NEEDED FOR ANY PURPOSE, NOTIFY LAB WITHIN 5 DAYS. LOWEST DETECTABLE LIMITS FOR URINE DRUG SCREEN Drug Class                     Cutoff (ng/mL) Amphetamine and metabolites    1000 Barbiturate and metabolites    200 Benzodiazepine                 200 Tricyclics and metabolites     300 Opiates and metabolites        300 Cocaine and metabolites        300 THC                            50 Performed  at [facility], [address], [place], [place] [address]   Comprehensive metabolic panel     Status: Abnormal   Collection Time: 11/27/18 10:09 PM  Result Value Ref Range   Sodium 137 135 - 145 mmol/L   Potassium 3.4 (L) 3.5 - 5.1 mmol/L   Chloride 103 98 - 111 mmol/L   CO2 22 22 - 32 mmol/L   Glucose, Bld 77 70 - 99 mg/dL   BUN 9 6 - 20 mg/dL   Creatinine, Ser 0.45 0.61 - 1.24 mg/dL   Calcium 9.2 8.9 - 99.7 mg/dL   Total Protein 7.4 6.5 - 8.1 g/dL   Albumin 4.2 3.5 - 5.0 g/dL   AST 37 15 - 41 U/L   ALT 20 0 - 44 U/L   Alkaline Phosphatase 52 38 - 126 U/L   Total Bilirubin 1.2 0.3 - 1.2 mg/dL   GFR calc non Af Amer >60 >60 mL/min   GFR calc Af Amer >60 >60 mL/min   Anion gap 12 5 - 15    Comment: Performed at [facility], [address], [place], [place] [address]  Ethanol     Status: Abnormal   Collection Time: 11/27/18 10:09 PM  Result Value Ref Range   Alcohol, Ethyl (B) 107 (H) <10 mg/dL    Comment: (NOTE) Lowest detectable limit for serum alcohol is 10 mg/dL. For medical purposes only. Performed at [facility], [address], [place], [place] [address]   Salicylate level     Status: None   Collection Time: 11/27/18 10:09 PM  Result Value Ref Range   Salicylate Lvl <7.0 2.8 - 30.0 mg/dL    Comment: Performed at [facility], [address] [place], [place] [address]  Acetaminophen level     Status: Abnormal   Collection Time: 11/27/18 10:09 PM  Result Value Ref Range   Acetaminophen (Tylenol), Serum <10 (L) 10 - 30 ug/mL    Comment: (NOTE) Therapeutic concentrations vary significantly. A range of 10-30 ug/mL  may be an effective concentration for many patients. However, some  are best treated at concentrations outside of this range. Acetaminophen concentrations >150 ug/mL at 4 hours after ingestion  and >50 ug/mL at 12 hours after ingestion are often associated with  toxic reactions. Performed at [facility], [address] [place], [place] [address]   cbc     Status: None   Collection Time: 11/27/18 10:09 PM  Result Value Ref Range   WBC 9.4 4.0 - 10.5 K/uL   RBC 5.21 4.22 - 5.81 MIL/uL   Hemoglobin 16.3 13.0 - 17.0 g/dL  HCT 47.5 39.0 - 52.0 %   MCV 91.2 80.0 - 100.0 fL   MCH 31.3 26.0 - 34.0 pg   MCHC 34.3 30.0 - 36.0 g/dL   RDW 40.9 81.1 - 91.4 %   Platelets 256 150 - 400 K/uL   nRBC 0.0 0.0 - 0.2 %    Comment: Performed at [facility], [address], [place], [place] [address]    Blood Alcohol level:  Lab Results  Component Value Date   ETH 107 (H) 11/27/2018   ETH <10 11/25/2018    Metabolic Disorder Labs:  Lab Results  Component Value Date   HGBA1C 5.4 01/31/2017   MPG 108 01/31/2017   Lab Results  Component Value Date   PROLACTIN 23.3 (H) 01/31/2017   Lab Results  Component Value Date   CHOL 116 01/31/2017   TRIG 89 01/31/2017   HDL 50 01/31/2017   CHOLHDL 2.3 01/31/2017   VLDL 18 01/31/2017   LDLCALC 48 01/31/2017    Current Medications: Current Facility-Administered Medications  Medication Dose Route Frequency Provider Last Rate Last Dose  . citalopram (CELEXA) tablet 20 mg  20 mg Oral Daily Rankin, Shuvon B, NP   20 mg at 11/29/18 0940  . folic acid (FOLVITE) tablet 1 mg  1 mg Oral Daily Antonieta Pert, MD   1 mg at 11/29/18 0940  . hydrOXYzine (ATARAX/VISTARIL) tablet 25 mg  25 mg Oral TID Rankin, Shuvon B, NP   25 mg at 11/29/18 0940  . LORazepam (ATIVAN) tablet 1 mg  1 mg Oral Q6H PRN Antonieta Pert, MD      . QUEtiapine (SEROQUEL) tablet 100 mg  100 mg Oral QHS Antonieta Pert, MD   100 mg at 11/28/18 2052  . thiamine (VITAMIN B-1) tablet 100 mg  100 mg Oral Daily Antonieta Pert, MD   100 mg at 11/29/18 0940  . traZODone (DESYREL) tablet 100 mg  100 mg Oral QHS Rankin, Shuvon B, NP   100 mg at 11/28/18 2052  . ziprasidone (GEODON) injection 20 mg  20 mg Intramuscular Once Antonieta Pert, MD       PTA Medications: Medications Prior to  Admission  Medication Sig Dispense Refill Last Dose  . citalopram (CELEXA) 10 MG tablet Take 2 tablets (20 mg total) by mouth daily. (Patient not taking: Reported on 11/28/2018) 60 tablet 0 Not Taking at Unknown time  . hydrOXYzine (ATARAX/VISTARIL) 25 MG tablet Take 1 tablet (25 mg total) by mouth 3 (three) times daily. (Patient not taking: Reported on 11/28/2018) 60 tablet 0 Not Taking at Unknown time  . traZODone (DESYREL) 100 MG tablet Take 1 tablet (100 mg total) by mouth at bedtime. (Patient not taking: Reported on 11/28/2018) 30 tablet 1 Not Taking at Unknown time    Musculoskeletal: Strength & Muscle Tone: within normal limits Gait & Station: normal Patient leans: N/A  Psychiatric Specialty Exam: Physical Exam vital stable but blood pressure up no other signs of withdrawal no tremors  ROS endocrine review of systems is negative, neurological review of systems is negative for seizures, cardiac review of systems negative for cocaine induced chest pain  Blood pressure (!) 140/94, pulse 95, temperature 98.1 F (36.7 C), temperature source Oral.There is no height or weight on file to calculate BMI.  General Appearance: Casual  Eye Contact:  None  Speech:  Slow and Slurred  Volume:  Decreased  Mood:  Dysphoric and Irritable  Affect:  Restricted  Thought Process:  Coherent and Linear  Orientation:  Full (Time, Place, and Person)  Thought Content:  Logical and Rumination  Suicidal Thoughts:  Yes.  without intent/plan/recent  Homicidal Thoughts:  No  Memory:  Immediate;   Good  Judgement:  Fair  Insight:  Fair  Psychomotor Activity:  Psychomotor Retardation  Concentration:  Concentration: Fair  Recall:  Fair  Fund of Knowledge:  Fair  Language:  paucity  Akathisia:  Negative  Handed:  Right  AIMS (if indicated):     Assets:  Leisure Time  ADL's:  Intact  Cognition:  WNL  Sleep:  Number of Hours: 6.75    Axis I depression recurrent severe without psychosis/cocaine  abuse/alcohol abuse/Xanax abuse and recent overdose versus overtaking of Xanax with the combination of the compounds leading to combativeness Axis II issues of secondary gain rule out antisocial Medically stable  Treatment plans  Monitor through the weekend for safety probable discharge Monday monitor for withdrawal    Physician Treatment Plan for Primary Diagnosis: <principal problem not specified> Long Term Goal(s): Improvement in symptoms so as ready for discharge  Short Term Goals: Ability to identify changes in lifestyle to reduce recurrence of condition will improve  Physician Treatment Plan for Secondary Diagnosis: Active Problems:   MDD (major depressive disorder), severe (HCC)  Long Term Goal(s): Improvement in symptoms so as ready for discharge  Short Term Goals: Ability to maintain clinical measurements within normal limits will improve  I certify that inpatient services furnished can reasonably be expected to improve the patient's condition.    Malvin JohnsFARAH,Rebeka Kimble, MD 4/24/202011:36 AM

## 2018-11-29 NOTE — Progress Notes (Signed)
Recreation Therapy Notes  Date:  4.24.20 Time: 0930 Location: [address] Dayroom  Group Topic: Stress Management  Goal Area(s) Addresses:  Patient will identify positive stress management techniques. Patient will identify benefits of using stress management post d/c.  Behavioral Response:  Engaged  Intervention:  Stress Management  Activity :  Progressive Muscle Relaxation.  LRT introduced the stress management technique of progressive muscle relaxation.  LRT read a script that talked patients through tensing and relaxing each muscle group individually.  Patients were to follow along as script was read to participate in activity.  Education:  Stress Management, Discharge Planning.   Education Outcome: Acknowledges Education  Clinical Observations/Feedback: Pt attended and participated in group.    Daniel [name], LRT/CTRS    Daniel [name], Sharmane Dame A 11/29/2018 10:30 AM

## 2018-11-29 NOTE — BHH Suicide Risk Assessment (Signed)
[facility] Admission Suicide Risk Assessment    Total Time spent with patient: 45 minutes Principal Problem: Reporting depressive symptoms and substance abuse Diagnosis:  Active Problems:   MDD (major depressive disorder), severe (HCC)  Subjective Data:  This is a repeat admission for Daniel [name] he is 28 years of age he had been abusing alcohol and Xanax and wound up being combative requiring police intervention and was screened through the emerge department and released on 4/20 He represented on 4/22 reporting multiple symptoms suicidal thoughts, homicidal thoughts auditory and visual hallucinations and requesting help with addictions clearly he is partly motivated by issues of secondary gain due to homelessness he is not combative though he is generally passive poorly cooperative but denies wanting to harm himself here understands what contracting is. Evasive about the exact amount of substances he is abusing drug screen does show cocaine blood alcohol was 107 on presentation 4/22 and again drug screen showed cocaine on 422 as well but we do not have a current 1  Currently alert in bed no eye contact little bit irritable no thoughts of harming self today contracting here only denies hallucinations when I questioned him clearly issues of secondary gain are prominent   Continued Clinical Symptoms:    The "Alcohol Use Disorders Identification Test", Guidelines for Use in Primary Care, Second Edition.  World Science writer [facility]). Score between 0-7:  no or low risk or alcohol related problems. Score between 8-15:  moderate risk of alcohol related problems. Score between 16-19:  high risk of alcohol related problems. Score 20 or above:  warrants further diagnostic evaluation for alcohol dependence and treatment.   CLINICAL FACTORS:   Depression:   Severe   Musculoskeletal: Strength & Muscle Tone: decreased Gait & Station: normal Patient leans: N/A  Psychiatric Specialty Exam: Physical Exam  vital stable but blood pressure up no other signs of withdrawal no tremors  ROS endocrine review of systems is negative, neurological review of systems is negative for seizures, cardiac review of systems negative for cocaine induced chest pain  Blood pressure (!) 140/94, pulse 95, temperature 98.1 F (36.7 C), temperature source Oral.There is no height or weight on file to calculate BMI.  General Appearance: Casual  Eye Contact:  None  Speech:  Slow and Slurred  Volume:  Decreased  Mood:  Dysphoric and Irritable  Affect:  Restricted  Thought Process:  Coherent and Linear  Orientation:  Full (Time, Place, and Person)  Thought Content:  Logical and Rumination  Suicidal Thoughts:  Yes.  without intent/plan/recent  Homicidal Thoughts:  No  Memory:  Immediate;   Good  Judgement:  Fair  Insight:  Fair  Psychomotor Activity:  Psychomotor Retardation  Concentration:  Concentration: Fair  Recall:  Fair  Fund of Knowledge:  Fair  Language:  paucity  Akathisia:  Negative  Handed:  Right  AIMS (if indicated):     Assets:  Leisure Time  ADL's:  Intact  Cognition:  WNL  Sleep:  Number of Hours: 6.75      COGNITIVE FEATURES THAT CONTRIBUTE TO RISK:  Polarized thinking    SUICIDE RISK:   Minimal: No identifiable suicidal ideation.  Patients presenting with no risk factors but with morbid ruminations; may be classified as minimal risk based on the severity of the depressive symptoms  PLAN OF CARE: admit for eval   I certify that inpatient services furnished can reasonably be expected to improve the patient's condition.   Malvin Johns, MD 11/29/2018, 11:28 AM

## 2018-11-29 NOTE — Progress Notes (Signed)
CSW provided patient with a list of [phone] vacancies for [facility] and briefly reviewed [employer] and criteria. Patient appreciative and reports he will make phone calls over the weekend. Patient wishes to follow up with [facility] the [facility] for outpatient care.  Enid Cutter, LCSW-A Clinical Social Worker

## 2018-11-30 MED ORDER — CITALOPRAM HYDROBROMIDE 20 MG PO TABS
30.0000 mg | ORAL_TABLET | Freq: Every day | ORAL | Status: DC
  Administered 2018-12-01 – 2018-12-03 (×3): 30 mg via ORAL
  Filled 2018-11-30 (×4): qty 1

## 2018-11-30 NOTE — Progress Notes (Signed)
Progress note  The male that now dates the mother to the patient's daughter called the unit stating that the pt has been calling and threatening to kill him. When confronted, the pt denies this, and states that his ex has been calling him from the boyfriend's phone. Dr. Jola Babinski has placed the pt on phone restrictions through today with reassessment tomorrow, 12/01/2018. Pt states he is ok with this, and just wants to move forward with his treatment as he has been since being here. Pt denies any physical pain. Pt is visibly anxious. Pt denies si/hi/ah/vh and verbally agrees to approach staff if these become apparent or before harming himself/others while at [facility]. Pt safe on the unit. Will continue to monitor.

## 2018-11-30 NOTE — Progress Notes (Addendum)
Adult Psychoeducational Group Note  Date:  11/30/2018 Time:  9:39 PM  Group Topic/Focus:  Wrap-Up Group:   The focus of this group is to help patients review their daily goal of treatment and discuss progress on daily workbooks.  Participation Level:  Active  Participation Quality:  Appropriate  Affect:  Appropriate  Cognitive:  Appropriate  Insight: Appropriate  Engagement in Group:  Engaged  Modes of Intervention:  Discussion  Additional Comments:  Patient attended wrap-up group and said that his day was a 7. His goal for today is to get clean. His coping skills for today were listening to music, talking on the phone and speaking to the social worker. Patient stated that after he leaves here, he would like to be placed in a in-patient rehab facility.  Tech advised patient to speak to his Child psychotherapist about his plan.   Alvena Kiernan W Kalima Saylor 11/30/2018, 9:39 PM

## 2018-11-30 NOTE — Progress Notes (Signed)
Adult Psychoeducational Group Note  Date:  11/30/2018 Time:  1:45pm-3:00pm   Group Topic/Focus:  Managing Feelings:   The focus of this group is to identify what feelings patients have difficulty handling and develop a plan to handle them in a healthier way upon discharge.  Participation Level:  Active  Participation Quality:  Appropriate and Attentive  Affect:  Appropriate  Cognitive:  Alert, Appropriate and Oriented  Insight: Appropriate and Good  Engagement in Group:  Engaged  Modes of Intervention:  Discussion and Support  Additional Comments:  Patient informed the group that he has been struggling with addiction. Patient informed the group NA and AA groups. Patient informed the group that this is a better coping skill because it helps him to cope by providing him with support and acceptance. Patient informed the group that his family members could assist him in coping with his addiction by not using around him or having it around him. Patient informed the group that negative coping skills that he has used in the past include using to solve or not think about his problems. Patient informed the group that he will be encouraged to keep going by thinking about his life and his future.   Annye Asa 11/30/2018, 6:58 PM

## 2018-11-30 NOTE — Progress Notes (Signed)
D.  Pt pleasant on approach, denies complaints at this time.  Pt was positive for evening wrap up group, observed engaged in appropriate interaction with peers on the unit.  Pt denies SI/HI/AVH at this time.  A.  Support and encouragement offered, medication given as ordered  R.  Pt remains safe on the unit, will continue to monitor.  

## 2018-11-30 NOTE — Progress Notes (Signed)
Adult Psychoeducational Group Note  Date:  11/30/2018 Time:  8:45am-9:30am  Group Topic/Focus:  Goals Group:   The focus of this group is to help patients establish daily goals to achieve during treatment and discuss how the patient can incorporate goal setting into their daily lives to aide in recovery.  Participation Level:  Active  Participation Quality:  Appropriate and Attentive  Affect:  Appropriate  Cognitive:  Alert, Appropriate and Oriented  Insight: Appropriate and Good  Engagement in Group:  Engaged  Modes of Intervention:  Discussion and Support  Additional Comments:  Patient informed the group that his daughter is currently in foster care and he has not been able to see her due to the COVID 19 restrictions and social distancing. Patient informed the group that he has been having a hard time because he was living for his daughter and he does not know what to do if he cannot see her. Patient informed the group that his goal for the day is to be better today than he was yesterday and to stay positive and keep hope. Patient informed the group that he is going to remind himself that things will get better and he is going to work towards this goal by thinking about his daughter and reframing his thinking. Patient informed the group that he would rate his day as a 5 and that his day would be able to shift from a 5 to a 6 if he is able to skype his daughter.  Annye Asa 11/30/2018, 12:20 PM

## 2018-11-30 NOTE — BHH Group Notes (Signed)
[facility] Group Notes:  (Nursing/MHT/Case Management/Adjunct)  Date:  11/30/2018  Time:  4:00 PM  Type of Therapy:  Nurse Education  Participation Level:  Active  Participation Quality:  Appropriate and Attentive  Affect:  Appropriate  Cognitive:  Alert and Appropriate  Insight:  Appropriate  Engagement in Group:  Engaged and Improving  Modes of Intervention:  Discussion and Education  Summary of Progress/Problems: pt's discussed issues with anger and coping skills to utilize once they are discharged.   Suszanne Conners Jacqueline Spofford 11/30/2018, 6:09 PM

## 2018-11-30 NOTE — Progress Notes (Signed)
D.  Pt pleasant on approach, no complaints voiced.  Pt was positive for evening wrap up group, observed engaged in appropriate interaction with peers on the unit.  Pt denies SI/HI/AVH at this time.  A.  Support and encouragement offered, medication given as ordered  R.  Pt remains safe on the unit, will continue to monitor.   

## 2018-11-30 NOTE — Progress Notes (Signed)
[facility] MD Progress Note  11/30/2018 9:07 AM Daniel [name]  MRN:  [id number]   Subjective:  Daniel [name] reports " I was just stressed out because I am not able to see my daughter."  Evaluation: Patient observed sitting in day room interacting with peers.  Patient reports suicidal ideations related to family stressors.  States " I do not have custody of my daughter anymore and she is my motivation."  Patient reports worsening depression and states he has been off his medication since February.  States he was prescribed Depakote for his mood.  States he has been homeless for quite a while.  Reports plans to follow-up with social worker for additional resources. Daniel [name] reported  "  I did need to be able to get back on my feet."  denies auditory or visual hallucinations.  Denies homicidal ideations.  Reports a good appetite.  States he is resting "Okay" throughout the night.  Discussed titration to Celexa patient was agreeable to plan.  We will continue to monitor for safety.  Support encouragement reassurance was provided.  History: per assessment note:This is a repeat admission for Daniel [name] he is 28 years of age he had been abusing alcohol and Xanax and wound up being combative requiring police intervention and was screened through the emerge department and released on 4/20 He represented on 4/22 reporting multiple symptoms suicidal thoughts, homicidal thoughts auditory and visual hallucinations and requesting help with addictions clearly he is partly motivated by issues of secondary gain due to homelessness he is not combative though he is generally passive poorly cooperative but denies wanting to harm himself here understands what contracting is.   Principal Problem: <principal problem not specified> Diagnosis: Active Problems:   MDD (major depressive disorder), severe (HCC)  Total Time spent with patient: 15 minutes  Past Psychiatric History:   Past Medical History:  Past Medical History:   Diagnosis Date  . Boil    No past surgical history on file. Family History:  Family History  Problem Relation Age of Onset  . Drug abuse Mother    Family Psychiatric  History:  Social History:  Social History   Substance and Sexual Activity  Alcohol Use Yes   Comment: occ     Social History   Substance and Sexual Activity  Drug Use Not Currently  . Types: Marijuana, Cocaine    Social History   Socioeconomic History  . Marital status: Single    Spouse name: Not on file  . Number of children: Not on file  . Years of education: Not on file  . Highest education level: Not on file  Occupational History  . Not on file  Social Needs  . Financial resource strain: Not on file  . Food insecurity:    Worry: Not on file    Inability: Not on file  . Transportation needs:    Medical: Not on file    Non-medical: Not on file  Tobacco Use  . Smoking status: Current Every Day Smoker    Packs/day: 0.50    Types: Cigarettes  . Smokeless tobacco: Never Used  Substance and Sexual Activity  . Alcohol use: Yes    Comment: occ  . Drug use: Not Currently    Types: Marijuana, Cocaine  . Sexual activity: Not on file  Lifestyle  . Physical activity:    Days per week: Not on file    Minutes per session: Not on file  . Stress: Not on file  Relationships  . Social connections:  Talks on phone: Not on file    Gets together: Not on file    Attends religious service: Not on file    Active member of club or organization: Not on file    Attends meetings of clubs or organizations: Not on file    Relationship status: Not on file  Other Topics Concern  . Not on file  Social History Narrative  . Not on file   Additional Social History:                         Sleep: Fair  Appetite:  Fair  Current Medications: Current Facility-Administered Medications  Medication Dose Route Frequency Provider Last Rate Last Dose  . [START ON 12/01/2018] citalopram (CELEXA) tablet 30  mg  30 mg Oral Daily Oneta Rack, NP      . folic acid (FOLVITE) tablet 1 mg  1 mg Oral Daily Antonieta Pert, MD   1 mg at 11/30/18 0730  . hydrOXYzine (ATARAX/VISTARIL) tablet 25 mg  25 mg Oral TID Rankin, Shuvon B, NP   25 mg at 11/30/18 0730  . LORazepam (ATIVAN) tablet 1 mg  1 mg Oral Q6H PRN Antonieta Pert, MD      . QUEtiapine (SEROQUEL) tablet 100 mg  100 mg Oral QHS Antonieta Pert, MD   100 mg at 11/29/18 2151  . thiamine (VITAMIN B-1) tablet 100 mg  100 mg Oral Daily Antonieta Pert, MD   100 mg at 11/30/18 0730  . traZODone (DESYREL) tablet 100 mg  100 mg Oral QHS Rankin, Shuvon B, NP   100 mg at 11/29/18 2151  . ziprasidone (GEODON) injection 20 mg  20 mg Intramuscular Once Antonieta Pert, MD        Lab Results: No results found for this or any previous visit (from the past 48 hour(s)).  Blood Alcohol level:  Lab Results  Component Value Date   ETH 107 (H) 11/27/2018   ETH <10 11/25/2018    Metabolic Disorder Labs: Lab Results  Component Value Date   HGBA1C 5.4 01/31/2017   MPG 108 01/31/2017   Lab Results  Component Value Date   PROLACTIN 23.3 (H) 01/31/2017   Lab Results  Component Value Date   CHOL 116 01/31/2017   TRIG 89 01/31/2017   HDL 50 01/31/2017   CHOLHDL 2.3 01/31/2017   VLDL 18 01/31/2017   LDLCALC 48 01/31/2017    Physical Findings: AIMS: Facial and Oral Movements Muscles of Facial Expression: None, normal Lips and Perioral Area: None, normal Jaw: None, normal Tongue: None, normal,Extremity Movements Upper (arms, wrists, hands, fingers): None, normal Lower (legs, knees, ankles, toes): None, normal, Trunk Movements Neck, shoulders, hips: None, normal, Overall Severity Severity of abnormal movements (highest score from questions above): None, normal Incapacitation due to abnormal movements: None, normal Patient's awareness of abnormal movements (rate only patient's report): No Awareness, Dental Status Current problems  with teeth and/or dentures?: No Does patient usually wear dentures?: No  CIWA:  CIWA-Ar Total: 0 COWS:     Musculoskeletal: Strength & Muscle Tone: within normal limits Gait & Station: normal Patient leans: N/A  Psychiatric Specialty Exam: Physical Exam  ROS  Blood pressure (!) 146/96, pulse 61, temperature 97.7 F (36.5 C), temperature source Oral, SpO2 100 %.There is no height or weight on file to calculate BMI.  General Appearance: Casual  Eye Contact:  Fair  Speech:  Clear and Coherent  Volume:  Normal  Mood:  Anxious  Affect:  Congruent  Thought Process:  Coherent  Orientation:  Full (Time, Place, and Person)  Thought Content:  Hallucinations: None  Suicidal Thoughts:  Yes.  with intent/plan  Homicidal Thoughts:  No  Memory:  Immediate;   Fair  Judgement:  Fair  Insight:  Fair  Psychomotor Activity:  Normal  Concentration:  Concentration: Fair  Recall:  [employer] of Knowledge:  Fair  Language:  Fair  Akathisia:  No  Handed:  Right  AIMS (if indicated):     Assets:  Communication Skills Desire for Improvement Resilience Social Support  ADL's:  Intact  Cognition:  WNL  Sleep:  Number of Hours: 6.75     Treatment Plan Summary: Daily contact with patient to assess and evaluate symptoms and progress in treatment and Medication management   Continue with current treatment plan on 11/30/2018 as listed below except for noted  Mood stabilization:  Increase Celexa 20 mg to 30 mg p.o. daily Continue Seroquel 100 mg p.o. nightly Continue trazodone 100 mg p.o. nightly  CIWA/PRN protocol  CSW to continue working on discharge disposition Patient encouraged to participate throughout the milieu  Oneta Rack, NP 11/30/2018, 9:07 AM

## 2018-11-30 NOTE — Progress Notes (Signed)
Adult Psychoeducational Group Note  Date:  11/30/2018 Time:  5:15 AM  Group Topic/Focus:  Wrap-Up Group:   The focus of this group is to help patients review their daily goal of treatment and discuss progress on daily workbooks.  Participation Level:  Active  Participation Quality:  Appropriate  Affect:  Appropriate  Cognitive:  Alert and Appropriate  Insight: Appropriate and Good  Engagement in Group:  Engaged  Modes of Intervention:  Discussion  Additional Comments:  Pt said his day was a 10. The one positive thing that happened to him he talked to the Child psychotherapist. He was able to release a lot that was going on with him. He feels better. He eas to take a different approach to his life  Chauncey Fischer 11/30/2018, 5:15 AM

## 2018-12-01 NOTE — Progress Notes (Signed)
D   Pt is very talkative and is pleasant on approach   He said he wants to go to [facility] and then go to [facility] after that   He said he wants to get clean for his 66 month old daughter so he can live a better life A   Verbal support given   Medications administered and effectiveness monitored      Q 15 min checks R   Pt remains safe at this time  [place] NOVEL CORONAVIRUS (COVID-19) DAILY CHECK-OFF SYMPTOMS - answer yes or no to each - every day NO YES  Have you had a fever in the past 24 hours?  . Fever (Temp > 37.80C / 100F) X   Have you had any of these symptoms in the past 24 hours? . New Cough .  Sore Throat  .  Shortness of Breath .  Difficulty Breathing .  Unexplained Body Aches   X   Have you had any one of these symptoms in the past 24 hours not related to allergies?   . Runny Nose .  Nasal Congestion .  Sneezing   X   If you have had runny nose, nasal congestion, sneezing in the past 24 hours, has it worsened?  X   EXPOSURES - check yes or no X   Have you traveled outside the state in the past 14 days?  X   Have you been in contact with someone with a confirmed diagnosis of COVID-19 or PUI in the past 14 days without wearing appropriate PPE?  X   Have you been living in the same home as a person with confirmed diagnosis of COVID-19 or a PUI (household contact)?    X   Have you been diagnosed with COVID-19?    X              What to do next: Answered NO to all: Answered YES to anything:   Proceed with unit schedule Follow the [facility] Inpatient Flowsheet.

## 2018-12-01 NOTE — [facility] Group Notes (Signed)
[facility] LCSW Group Therapy Note  Date/Time:  12/01/2018 9:00-10:00 or 10:00-11:00AM  Type of Therapy and Topic:  Group Therapy:  Healthy and Unhealthy Supports  Participation Level:  Did Not Attend   Description of Group:  Patients in this group were introduced to the idea of adding a variety of healthy supports to address the various needs in their lives.Patients discussed what additional healthy supports could be helpful in their recovery and wellness after discharge in order to prevent future hospitalizations.   An emphasis was placed on using counselor, doctor, therapy groups, 12-step groups, and problem-specific support groups to expand supports.  They also worked as a group on developing a specific plan for several patients to deal with unhealthy supports through boundary-setting, psychoeducation with loved ones, and even termination of relationships.   Therapeutic Goals:   1)  discuss importance of adding supports to stay well once out of the hospital  2)  compare healthy versus unhealthy supports and identify some examples of each  3)  generate ideas and descriptions of healthy supports that can be added  4)  offer mutual support about how to address unhealthy supports  5)  encourage active participation in and adherence to discharge plan    Summary of Patient Progress:   Did not attend   Therapeutic Modalities:   Motivational Interviewing Brief Solution-Focused Therapy  Donell Sliwinski D Adin Lariccia         

## 2018-12-01 NOTE — Progress Notes (Addendum)
D. Pt has been calm and cooperative- friendly during interactions- observed interacting appropriately with peers in the milieu. Pt reports that he would like to go to [facility] from here- and was told to call there tomorrow am between 8:30 and 9am to check bed status. Pt currently denies SI/HI and AVH A. Labs and vitals monitored. Pt compliant with medications. Pt supported emotionally and encouraged to express concerns and ask questions.   R. Pt remains safe with 15 minute checks. Will continue POC.

## 2018-12-01 NOTE — Progress Notes (Signed)
[facility] MD Progress Note  12/01/2018 12:05 PM Nikkolas Coomes  MRN:  [id number]   Subjective:  Yovanny reports " I was just stressed out because I am not able to see my daughter."  Evaluation today: Patient observed sitting in day room interacting with peers, pleasant, polite and smiling. He is calm and cooperative.  Patient reports suicidal ideations related to family stressors.  States " I do not have custody of my daughter anymore and she is my motivation."  Patient reports worsening depression and states he has been off his medication since February. He reported that he does not feel the medicine is helping him yet but he understands he has to take it every day and that the medicine can take time to start working. 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 States he has been homeless for quite a while.   Walter reported  "I did need to be able to get back on my feet."  He stated he wants to try to get into [facility] and spoke to the social worker today. He stated he was told to call [facility] in the morning between 8:30 and 9:00 for an intake. He is hopeful that being in [facility] will help him get into [facility]. He seems to be sincere about wanting to get sober and get his life back on track. He  denies auditory or visual hallucinations.  Denies homicidal ideations. Reports a good appetite. States he is resting "Okay" throughout the night. He did report some nightmares last night. We discussed the possibility that his poor sleep could be from his body and brain withdrawing from alcohol and drugs. Also, we discussed that his sleep should begin to improve the longer he stays sober. Discussed titration to Celexa patient was agreeable to plan. We will continue to monitor for safety. Support encouragement reassurance was provided.  History: per assessment note:This is a repeat admission for [name] he is 28 years of age he had been abusing alcohol and Xanax  and wound up being combative requiring police intervention and was screened through the emerge department and released on 4/20 He represented on 4/22 reporting multiple symptoms suicidal thoughts, homicidal thoughts auditory and visual hallucinations and requesting help with addictions clearly he is partly motivated by issues of secondary gain due to homelessness he is not combative though he is generally passive poorly cooperative but denies wanting to harm himself here understands what contracting is.   Principal Problem: MDD (major depressive disorder), severe (HCC) Diagnosis: Active Problems:   MDD (major depressive disorder), severe (HCC)  Total Time spent with patient: 15 minutes  Past Psychiatric History:   Past Medical History:  Past Medical History:  Diagnosis Date  . Boil    No past surgical history on file. Family History:  Family History  Problem Relation Age of Onset  . Drug abuse Mother    Family Psychiatric  History:  Social History:  Social History   Substance and Sexual Activity  Alcohol Use Yes   Comment: occ     Social History   Substance and Sexual Activity  Drug Use Not Currently  . Types: Marijuana, Cocaine    Social History   Socioeconomic History  . Marital status: Single    Spouse name: Not on file  . Number of children: Not on file  . Years of education: Not on file  . Highest education level: Not on file  Occupational History  . Not on  file  Social Needs  . Financial resource strain: Not on file  . Food insecurity:    Worry: Not on file    Inability: Not on file  . Transportation needs:    Medical: Not on file    Non-medical: Not on file  Tobacco Use  . Smoking status: Current Every Day Smoker    Packs/day: 0.50    Types: Cigarettes  . Smokeless tobacco: Never Used  Substance and Sexual Activity  . Alcohol use: Yes    Comment: occ  . Drug use: Not Currently    Types: Marijuana, Cocaine  . Sexual activity: Not on file   Lifestyle  . Physical activity:    Days per week: Not on file    Minutes per session: Not on file  . Stress: Not on file  Relationships  . Social connections:    Talks on phone: Not on file    Gets together: Not on file    Attends religious service: Not on file    Active member of club or organization: Not on file    Attends meetings of clubs or organizations: Not on file    Relationship status: Not on file  Other Topics Concern  . Not on file  Social History Narrative  . Not on file   Additional Social History:     Sleep: Fair  Appetite:  Fair  Current Medications: Current Facility-Administered Medications  Medication Dose Route Frequency Provider Last Rate Last Dose  . citalopram (CELEXA) tablet 30 mg  30 mg Oral Daily Oneta RackLewis, Tanika N, NP   30 mg at 12/01/18 0858  . folic acid (FOLVITE) tablet 1 mg  1 mg Oral Daily Antonieta Pertlary, Greg Lawson, MD   1 mg at 12/01/18 0858  . hydrOXYzine (ATARAX/VISTARIL) tablet 25 mg  25 mg Oral TID Rankin, Shuvon B, NP   25 mg at 12/01/18 0858  . LORazepam (ATIVAN) tablet 1 mg  1 mg Oral Q6H PRN Antonieta Pertlary, Greg Lawson, MD      . QUEtiapine (SEROQUEL) tablet 100 mg  100 mg Oral QHS Antonieta Pertlary, Greg Lawson, MD   100 mg at 11/30/18 2107  . thiamine (VITAMIN B-1) tablet 100 mg  100 mg Oral Daily Antonieta Pertlary, Greg Lawson, MD   100 mg at 12/01/18 0858  . traZODone (DESYREL) tablet 100 mg  100 mg Oral QHS Rankin, Shuvon B, NP   100 mg at 11/30/18 2107  . ziprasidone (GEODON) injection 20 mg  20 mg Intramuscular Once Antonieta Pertlary, Greg Lawson, MD        Lab Results: No results found for this or any previous visit (from the past 48 hour(s)).  Blood Alcohol level:  Lab Results  Component Value Date   ETH 107 (H) 11/27/2018   ETH <10 11/25/2018    Metabolic Disorder Labs: Lab Results  Component Value Date   HGBA1C 5.4 01/31/2017   MPG 108 01/31/2017   Lab Results  Component Value Date   PROLACTIN 23.3 (H) 01/31/2017   Lab Results  Component Value Date   CHOL 116  01/31/2017   TRIG 89 01/31/2017   HDL 50 01/31/2017   CHOLHDL 2.3 01/31/2017   VLDL 18 01/31/2017   LDLCALC 48 01/31/2017    Physical Findings: AIMS: Facial and Oral Movements Muscles of Facial Expression: None, normal Lips and Perioral Area: None, normal Jaw: None, normal Tongue: None, normal,Extremity Movements Upper (arms, wrists, hands, fingers): None, normal Lower (legs, knees, ankles, toes): None, normal, Trunk Movements Neck, shoulders, hips: None, normal,  Overall Severity Severity of abnormal movements (highest score from questions above): None, normal Incapacitation due to abnormal movements: None, normal Patient's awareness of abnormal movements (rate only patient's report): No Awareness, Dental Status Current problems with teeth and/or dentures?: No Does patient usually wear dentures?: No  CIWA:  CIWA-Ar Total: 1 COWS:     Musculoskeletal: Strength & Muscle Tone: within normal limits Gait & Station: normal Patient leans: N/A  Psychiatric Specialty Exam: Physical Exam  ROS  Blood pressure (!) 128/93, pulse 72, temperature 97.6 F (36.4 C), temperature source Oral, SpO2 100 %.There is no height or weight on file to calculate BMI.  General Appearance: Casual  Eye Contact:  Fair  Speech:  Clear and Coherent  Volume:  Normal  Mood:  Anxious  Affect:  Congruent  Thought Process:  Coherent  Orientation:  Full (Time, Place, and Person)  Thought Content:  Hallucinations: None  Suicidal Thoughts:  Yes.  with intent/plan  Homicidal Thoughts:  No  Memory:  Immediate;   Fair  Judgement:  Fair  Insight:  Fair  Psychomotor Activity:  Normal  Concentration:  Concentration: Fair  Recall:  [employer] of Knowledge:  Fair  Language:  Fair  Akathisia:  No  Handed:  Right  AIMS (if indicated):     Assets:  Communication Skills Desire for Improvement Resilience Social Support  ADL's:  Intact  Cognition:  WNL  Sleep:  Number of Hours: 6.75     Treatment Plan  Summary: Daily contact with patient to assess and evaluate symptoms and progress in treatment and Medication management   Continue with current treatment plan on 11/30/2018 as listed below except for noted  Mood stabilization:  Increase Celexa 20 mg to 30 mg p.o. daily Continue Seroquel 100 mg p.o. nightly Continue trazodone 100 mg p.o. nightly  CIWA/PRN protocol  CSW to continue working on discharge disposition. Patient wants to go to [facility] upon discharge.  Patient encouraged to participate throughout the milieu  Laveda Abbe, NP 12/01/2018, 12:05 PM

## 2018-12-01 NOTE — Progress Notes (Signed)
[place] NOVEL CORONAVIRUS (COVID-19) DAILY CHECK-OFF SYMPTOMS - answer yes or no to each - every day NO YES  Have you had a fever in the past 24 hours?  . Fever (Temp > 37.80C / 100F) X   Have you had any of these symptoms in the past 24 hours? . New Cough .  Sore Throat  .  Shortness of Breath .  Difficulty Breathing .  Unexplained Body Aches   X   Have you had any one of these symptoms in the past 24 hours not related to allergies?   . Runny Nose .  Nasal Congestion .  Sneezing   X   If you have had runny nose, nasal congestion, sneezing in the past 24 hours, has it worsened?  X   EXPOSURES - check yes or no X   Have you traveled outside the state in the past 14 days?  X   Have you been in contact with someone with a confirmed diagnosis of COVID-19 or PUI in the past 14 days without wearing appropriate PPE?  X   Have you been living in the same home as a person with confirmed diagnosis of COVID-19 or a PUI (household contact)?    X   Have you been diagnosed with COVID-19?    X              What to do next: Answered NO to all: Answered YES to anything:   Proceed with unit schedule Follow the [facility] Inpatient Flowsheet.   

## 2018-12-01 NOTE — Progress Notes (Signed)
[facility] Group Notes:  (Nursing/MHT/Case Management/Adjunct)  Date:  12/01/2018  Time:  2030  Type of Therapy:  wrap up group  Participation Level:  Active  Participation Quality:  Appropriate, Attentive, Sharing and Supportive  Affect:  Anxious and Appropriate  Cognitive:  Appropriate  Insight:  Improving  Engagement in Group:  Engaged  Modes of Intervention:  Clarification, Education and Support  Summary of Progress/Problems: Pt is interested in discharging to a treatment program and mentions [facility].  Pt would like to work on his negative thinking and think more positive. Pt is grateful for his 28 month old daughter.   Daniel [name] S 12/01/2018, 10:04 PM

## 2018-12-02 DIAGNOSIS — F322 Major depressive disorder, single episode, severe without psychotic features: Principal | ICD-10-CM

## 2018-12-02 MED ORDER — TRAZODONE HCL 100 MG PO TABS: 100.0000 mg | ORAL_TABLET | Freq: Every day | ORAL | 0 refills | Status: DC

## 2018-12-02 MED ORDER — QUETIAPINE FUMARATE 100 MG PO TABS: 100.0000 mg | ORAL_TABLET | Freq: Every day | ORAL | 0 refills | Status: DC

## 2018-12-02 MED ORDER — CITALOPRAM HYDROBROMIDE 10 MG PO TABS: 30.0000 mg | ORAL_TABLET | Freq: Every day | ORAL | 0 refills | Status: DC

## 2018-12-02 MED ORDER — HYDROXYZINE HCL 25 MG PO TABS: 25.0000 mg | ORAL_TABLET | Freq: Three times a day (TID) | ORAL | 0 refills | Status: DC

## 2018-12-02 MED ORDER — CITALOPRAM HYDROBROMIDE 10 MG PO TABS
30.0000 mg | ORAL_TABLET | Freq: Every day | ORAL | Status: DC
  Filled 2018-12-02: qty 42

## 2018-12-02 NOTE — [facility] Suicide Risk Assessment (Signed)
[facility] Discharge Suicide Risk Assessment   Principal Problem: MDD (major depressive disorder), severe (HCC) Discharge Diagnoses: Principal Problem:   MDD (major depressive disorder), severe (HCC) cocaine abuse/alcohol abuse  Total Time spent with patient: 45 minutes   Musculoskeletal: Strength & Muscle Tone: within normal limits Gait & Station: normal Patient leans: N/A  Psychiatric Specialty Exam: Physical Exam  ROS  Blood pressure 134/83, pulse 81, temperature 97.9 F (36.6 C), temperature source Oral, SpO2 100 %.There is no height or weight on file to calculate BMI.  General Appearance: Casual and Disheveled  Eye Contact:  Good  Speech:  Clear and Coherent  Volume:  Normal  Mood:  Euthymic  Affect:  Appropriate and Congruent  Thought Process:  Coherent and Linear  Orientation:  Full (Time, Place, and Person)  Thought Content:  Logical and Tangential  Suicidal Thoughts:  No  Homicidal Thoughts:  No  Memory:  Immediate;   Fair  Judgement:  Fair  Insight:  Fair  Psychomotor Activity:  Normal  Concentration:  Concentration: Fair  Recall:  [employer] of Knowledge:  Fair  Language:  Fair  Akathisia:  Negative  Handed:  Right  AIMS (if indicated):     Assets:  Leisure Time Physical Health  ADL's:  Intact  Cognition:  WNL  Sleep:  Number of Hours: 4.25      Mental Status Per Nursing Assessment::   On Admission:     Demographic Factors:  Male  Loss Factors: Decrease in vocational status  Historical Factors: NA  Risk Reduction Factors:   Sense of responsibility to family  Continued Clinical Symptoms:  Alcohol/Substance Abuse/Dependencies  Cognitive Features That Contribute To Risk:  None    Suicide Risk:  Minimal: No identifiable suicidal ideation.  Patients presenting with no risk factors but with morbid ruminations; may be classified as minimal risk based on the severity of the depressive symptoms  Follow-up Information    [facility] Follow up on 12/03/2018.   Why:  Please attend your screening for possbile admission on Tuesday, 4/28 at 7:45a.  Be sure to bring your photo ID, proof of [place] residency, 30-supply of medication, 2 weeks a clothing, and discharge paperwork from this hospitalization.  Contact information: [address] [place] [address] [phone]           Plan Of Care/Follow-up recommendations:  Activity:  fullMalvin Johns, MD 12/02/2018, 2:56 PM

## 2018-12-02 NOTE — Progress Notes (Signed)
Pt attended spiritual care group on grief and loss facilitated by chaplain Burnis Kingfisher   Group opened with brief discussion and psycho-social ed around grief and loss in relationships and in relation to self - identifying life patterns, circumstances, changes that cause losses. Established group norm of speaking from own life experience. Group goal of establishing open and affirming space for members to share loss and experience with grief, normalize grief experience and provide psycho social education and grief support.    Pt was present through 70% of group.  Stated he was excited about going to [facility].  Was talkative, yet easily rediredcted to allow others in group to engage.  Burnis Kingfisher, MDiv, [facility]

## 2018-12-02 NOTE — Progress Notes (Signed)
Adult Psychoeducational Group Note  Date:  12/02/2018 Time:  6:25 PM  Group Topic/Focus:  Goals Group:   The focus of this group is to help patients establish daily goals to achieve during treatment and discuss how the patient can incorporate goal setting into their daily lives to aide in recovery.  Participation Level:  Active  Participation Quality:  Appropriate  Affect:  Appropriate  Cognitive:  Alert  Insight: Appropriate  Engagement in Group:  Engaged  Modes of Intervention:  Discussion  Additional Comments:  Pt attended group and participated in discussion.   Mirian Casco R Marjon Doxtater 12/02/2018, 6:25 PM

## 2018-12-02 NOTE — Progress Notes (Signed)
Chaplain encountered pt at nursing station.  Provided support while patient worked with nursing and social work.  Pt wished to join chaplain and others in prayer after. Prayed for continued healing so he can parent his 7 month old daughter whom he reports is currently in foster care.    Burnis Kingfisher, MDiv, [facility]

## 2018-12-02 NOTE — Progress Notes (Signed)
[facility]  12/02/2018 12:01 PM Daniel [name]  MRN:  [id number] Subjective:    [facility] has no cravings tremors or withdrawal symptoms and no thoughts of harming self or others generally cooperative.  No involuntary movements Mood seems euthymic and stable no psychosis Principal Problem: MDD (major depressive disorder), severe (HCC) Diagnosis: Principal Problem:   MDD (major depressive disorder), severe (HCC)  Total Time spent with patient: 20 minutes  Past Medical History:  Past Medical History:  Diagnosis Date  . Boil    No past surgical history on file. Family History:  Family History  Problem Relation Age of Onset  . Drug abuse Mother     Social History:  Social History   Substance and Sexual Activity  Alcohol Use Yes   Comment: occ     Social History   Substance and Sexual Activity  Drug Use Not Currently  . Types: Marijuana, Cocaine    Social History   Socioeconomic History  . Marital status: Single    Spouse name: Not on file  . Number of children: Not on file  . Years of education: Not on file  . Highest education level: Not on file  Occupational History  . Not on file  Social Needs  . Financial resource strain: Not on file  . Food insecurity:    Worry: Not on file    Inability: Not on file  . Transportation needs:    Medical: Not on file    Non-medical: Not on file  Tobacco Use  . Smoking status: Current Every Day Smoker    Packs/day: 0.50    Types: Cigarettes  . Smokeless tobacco: Never Used  Substance and Sexual Activity  . Alcohol use: Yes    Comment: occ  . Drug use: Not Currently    Types: Marijuana, Cocaine  . Sexual activity: Not on file  Lifestyle  . Physical activity:    Days per week: Not on file    Minutes per session: Not on file  . Stress: Not on file  Relationships  . Social connections:    Talks on phone: Not on file    Gets together: Not  on file    Attends religious service: Not on file    Active member of club or organization: Not on file    Attends meetings of clubs or organizations: Not on file    Relationship status: Not on file  Other Topics Concern  . Not on file  Social History Narrative  . Not on file   Additional Social History:                         Sleep: Good  Appetite:  Good  Current Medications: Current Facility-Administered Medications  Medication Dose Route Frequency Provider Last Rate Last Dose  . citalopram (CELEXA) tablet 30 mg  30 mg Oral Daily Oneta RackLewis, Tanika N, NP   30 mg at 12/02/18 0837  . folic acid (FOLVITE) tablet 1 mg  1 mg Oral Daily Antonieta Pertlary, Greg Lawson, MD   1 mg at 12/02/18 [phone]  . hydrOXYzine (ATARAX/VISTARIL) tablet 25 mg  25 mg Oral TID Rankin, Shuvon B, NP   25 mg at 12/02/18 [id number]  . LORazepam (ATIVAN) tablet 1 mg  1 mg Oral Q6H PRN Antonieta Pertlary, Greg Lawson, MD   1 mg at 12/01/18 2145  . QUEtiapine (SEROQUEL) tablet 100 mg  100 mg Oral  QHS Antonieta Pert, MD   100 mg at 12/01/18 2145  . thiamine (VITAMIN B-1) tablet 100 mg  100 mg Oral Daily Antonieta Pert, MD   100 mg at 12/02/18 5053  . traZODone (DESYREL) tablet 100 mg  100 mg Oral QHS Rankin, Shuvon B, NP   100 mg at 12/01/18 2145  . ziprasidone (GEODON) injection 20 mg  20 mg Intramuscular Once Antonieta Pert, MD        Lab Results: No results found for this or any previous visit (from the past 48 hour(s)).  Blood Alcohol level:  Lab Results  Component Value Date   ETH 107 (H) 11/27/2018   ETH <10 11/25/2018    Metabolic Disorder Labs: Lab Results  Component Value Date   HGBA1C 5.4 01/31/2017   MPG 108 01/31/2017   Lab Results  Component Value Date   PROLACTIN 23.3 (H) 01/31/2017   Lab Results  Component Value Date   CHOL 116 01/31/2017   TRIG 89 01/31/2017   HDL 50 01/31/2017   CHOLHDL 2.3 01/31/2017   VLDL 18 01/31/2017   LDLCALC 48 01/31/2017    Physical Findings: AIMS: Facial and  Oral Movements Muscles of Facial Expression: None, normal Lips and Perioral Area: None, normal Jaw: None, normal Tongue: None, normal,Extremity Movements Upper (arms, wrists, hands, fingers): None, normal Lower (legs, knees, ankles, toes): None, normal, Trunk Movements Neck, shoulders, hips: None, normal, Overall Severity Severity of abnormal movements (highest score from questions above): None, normal Incapacitation due to abnormal movements: None, normal Patient's awareness of abnormal movements (rate only patient's report): No Awareness, Dental Status Current problems with teeth and/or dentures?: No Does patient usually wear dentures?: No  CIWA:  CIWA-Ar Total: 2 COWS:     Musculoskeletal: Strength & Muscle Tone: within normal limits Gait & Station: normal Patient leans: N/A  Psychiatric Specialty Exam: Physical Exam  ROS  Blood pressure 134/83, pulse 81, temperature 97.9 F (36.6 C), temperature source Oral, SpO2 100 %.There is no height or weight on file to calculate BMI.  General Appearance: Casual and Disheveled  Eye Contact:  Good  Speech:  Clear and Coherent  Volume:  Normal  Mood:  Euthymic  Affect:  Appropriate and Congruent  Thought Process:  Coherent and Linear  Orientation:  Full (Time, Place, and Person)  Thought Content:  Logical and Tangential  Suicidal Thoughts:  No  Homicidal Thoughts:  No  Memory:  Immediate;   Fair  Judgement:  Fair  Insight:  Fair  Psychomotor Activity:  Normal  Concentration:  Concentration: Fair  Recall:  [employer] of Knowledge:  Fair  Language:  Fair  Akathisia:  Negative  Handed:  Right  AIMS (if indicated):     Assets:  Leisure Time Physical Health  ADL's:  Intact  Cognition:  WNL  Sleep:  Number of Hours: 4.25     Treatment Plan Summary: Daily contact with patient to assess and evaluate symptoms and progress in treatment, Medication management and Plan No change in current plans precautions or meds expect discharge  tomorrow to [facility] recovery services continue cognitive and rehab groups  Malvin Johns, MD 12/02/2018, 12:01 PM

## 2018-12-02 NOTE — Progress Notes (Signed)
Patient ID: Daniel [name], male   DOB: 1991-06-16, 28 y.o.   MRN: [id number] D) Pt has been labile in mood and affect. Pt displays poor boundaries requiring redirection. Pt rates his depression an 8/10 and anxiety level a 7/10. Hopelessness is a 3/10. Positive for unit activities with minimal prompting. Pt endorses irritability and agitation. Pt has been working on d/c plan related to long term rehab. Pt wanting to go to [facility] but has been accepted at [facility] and seems accepting of this. Denies s.i. A) level 3 obs for safety, support and encouragement provided. Redirection as needed. Limit setting as needed. Med ed reinforced. R) labile.

## 2018-12-02 NOTE — Progress Notes (Signed)
Recreation Therapy Notes  Date:  4.27.20 Time: 0930 Location: 300 Hall Dayroom  Group Topic: Stress Management  Goal Area(s) Addresses:  Patient will identify positive stress management techniques. Patient will identify benefits of using stress management post d/c.  Behavioral Response:  Engaged     Intervention: Stress Management  Activity :  Meditation.  LRT introduced the stress management technique of meditation.  LRT played a meditation that dealt with being resilient in the face of adversity.  Patients were to listen and follow along as meditation played.  Education:  Stress Management, Discharge Planning.   Education Outcome: Acknowledges Education  Clinical Observations/Feedback:  Pt attended and participated in group.     Arie Powell, LRT/CTRS         Afua Hoots A 12/02/2018 11:00 AM 

## 2018-12-02 NOTE — Progress Notes (Signed)
Patient ID: Daniel [name], male   DOB: 10-13-1990, 28 y.o.   MRN: [id number] [place] NOVEL CORONAVIRUS (COVID-19) DAILY CHECK-OFF SYMPTOMS - answer yes or no to each - every day NO YES  Have you had a fever in the past 24 hours?  . Fever (Temp > 37.80C / 100F) X   Have you had any of these symptoms in the past 24 hours? . New Cough .  Sore Throat  .  Shortness of Breath .  Difficulty Breathing .  Unexplained Body Aches   X   Have you had any one of these symptoms in the past 24 hours not related to allergies?   . Runny Nose .  Nasal Congestion .  Sneezing   X   If you have had runny nose, nasal congestion, sneezing in the past 24 hours, has it worsened?  X   EXPOSURES - check yes or no X   Have you traveled outside the state in the past 14 days?  X   Have you been in contact with someone with a confirmed diagnosis of COVID-19 or PUI in the past 14 days without wearing appropriate PPE?  X   Have you been living in the same home as a person with confirmed diagnosis of COVID-19 or a PUI (household contact)?    X   Have you been diagnosed with COVID-19?    X              What to do next: Answered NO to all: Answered YES to anything:   Proceed with unit schedule Follow the [facility] Inpatient Flowsheet.

## 2018-12-03 NOTE — Progress Notes (Signed)
  [facility] Adult Case Management Discharge Plan :  Will you be returning to the same living situation after discharge:  Yes,  patient discharged to [facility]  At discharge, do you have transportation home?: Yes,  taxi voucher  Do you have the ability to pay for your medications: No.  Release of information consent forms completed and in the chart;  Patient's signature needed at discharge.  Patient to Follow up at: Follow-up Information    [facility] Follow up on 12/03/2018.   Why:  Please attend your screening for possbile admission on Tuesday, 4/28 at 7:45a.  Be sure to bring your photo ID, proof of [place] residency, 30-supply of medication, 2 weeks a clothing, and discharge paperwork from this hospitalization.  Contact information: [address] [place] [address] [phone]           Next level of care provider has access to [facility] Link:yes  Safety Planning and Suicide Prevention discussed: Yes,  with the patient      Has patient been referred to the Quitline?: N/A patient is not a smoker  Patient has been referred for addiction treatment: Yes  Maeola Sarah, LCSWA 12/03/2018, 1:11 PM
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
## 2018-12-03 NOTE — Progress Notes (Signed)
Discharge note   Patient verbalizes readiness for discharge. Follow up plan explained, AVS, Transition record and SRA given. Prescriptions and teaching provided. Belongings returned and signed for. Suicide safety plan completed and signed. Patient verbalizes understanding. Patient denies SI/HI and assures this Clinical research associate he will seek assistance should that change. Patient discharged to lobby with taxi voucher to [facility].

## 2019-02-03 ENCOUNTER — Other Ambulatory Visit: Payer: Self-pay

## 2019-02-03 ENCOUNTER — Emergency Department (HOSPITAL_COMMUNITY): Payer: Self-pay

## 2019-02-03 ENCOUNTER — Ambulatory Visit (HOSPITAL_COMMUNITY)
Admission: RE | Admit: 2019-02-03 | Discharge: 2019-02-03 | Disposition: A | Discharge status: Home / Self Care | Payer: Self-pay | Attending: Psychiatry | Admitting: Psychiatry

## 2019-02-03 ENCOUNTER — Encounter (HOSPITAL_COMMUNITY): Payer: Self-pay

## 2019-02-03 ENCOUNTER — Emergency Department (HOSPITAL_COMMUNITY)
Admission: EM | Admit: 2019-02-03 | Discharge: 2019-02-03 | Disposition: A | Discharge status: Home / Self Care | Payer: Self-pay | Attending: Emergency Medicine | Admitting: Emergency Medicine

## 2019-02-03 DIAGNOSIS — F17210 Nicotine dependence, cigarettes, uncomplicated: Secondary | ICD-10-CM | POA: Insufficient documentation

## 2019-02-03 DIAGNOSIS — F1420 Cocaine dependence, uncomplicated: Secondary | ICD-10-CM | POA: Insufficient documentation

## 2019-02-03 DIAGNOSIS — F314 Bipolar disorder, current episode depressed, severe, without psychotic features: Secondary | ICD-10-CM | POA: Insufficient documentation

## 2019-02-03 DIAGNOSIS — Z79899 Other long term (current) drug therapy: Secondary | ICD-10-CM | POA: Insufficient documentation

## 2019-02-03 DIAGNOSIS — F1220 Cannabis dependence, uncomplicated: Secondary | ICD-10-CM | POA: Insufficient documentation

## 2019-02-03 DIAGNOSIS — F319 Bipolar disorder, unspecified: Secondary | ICD-10-CM | POA: Insufficient documentation

## 2019-02-03 DIAGNOSIS — Z20828 Contact with and (suspected) exposure to other viral communicable diseases: Secondary | ICD-10-CM | POA: Insufficient documentation

## 2019-02-03 DIAGNOSIS — R45851 Suicidal ideations: Secondary | ICD-10-CM | POA: Insufficient documentation

## 2019-02-03 DIAGNOSIS — F4321 Adjustment disorder with depressed mood: Secondary | ICD-10-CM

## 2019-02-03 DIAGNOSIS — F1910 Other psychoactive substance abuse, uncomplicated: Secondary | ICD-10-CM | POA: Insufficient documentation

## 2019-02-03 DIAGNOSIS — R0602 Shortness of breath: Secondary | ICD-10-CM | POA: Insufficient documentation

## 2019-02-03 DIAGNOSIS — F1020 Alcohol dependence, uncomplicated: Secondary | ICD-10-CM | POA: Insufficient documentation

## 2019-02-03 LAB — CBC WITH DIFFERENTIAL/PLATELET
Abs Immature Granulocytes: 0.05 10*3/uL (ref 0.00–0.07)
Basophils Absolute: 0.1 10*3/uL (ref 0.0–0.1)
Basophils Relative: 1 %
Eosinophils Absolute: 0.2 10*3/uL (ref 0.0–0.5)
Eosinophils Relative: 2 %
HCT: 50.4 % (ref 39.0–52.0)
Hemoglobin: 16.5 g/dL (ref 13.0–17.0)
Immature Granulocytes: 0 %
Lymphocytes Relative: 31 %
Lymphs Abs: 3.8 10*3/uL (ref 0.7–4.0)
MCH: 31.1 pg (ref 26.0–34.0)
MCHC: 32.7 g/dL (ref 30.0–36.0)
MCV: 95.1 fL (ref 80.0–100.0)
Monocytes Absolute: 1 10*3/uL (ref 0.1–1.0)
Monocytes Relative: 8 %
Neutro Abs: 7 10*3/uL (ref 1.7–7.7)
Neutrophils Relative %: 58 %
Platelets: 261 10*3/uL (ref 150–400)
RBC: 5.3 MIL/uL (ref 4.22–5.81)
RDW: 13 % (ref 11.5–15.5)
WBC: 12.2 10*3/uL — ABNORMAL HIGH (ref 4.0–10.5)
nRBC: 0 % (ref 0.0–0.2)

## 2019-02-03 LAB — RAPID URINE DRUG SCREEN, HOSP PERFORMED
Amphetamines: NOT DETECTED
Barbiturates: NOT DETECTED
Benzodiazepines: NOT DETECTED
Cocaine: POSITIVE — AB
Opiates: NOT DETECTED
Tetrahydrocannabinol: POSITIVE — AB

## 2019-02-03 LAB — COMPREHENSIVE METABOLIC PANEL
ALT: 14 U/L (ref 0–44)
AST: 20 U/L (ref 15–41)
Albumin: 4.3 g/dL (ref 3.5–5.0)
Alkaline Phosphatase: 62 U/L (ref 38–126)
Anion gap: 11 (ref 5–15)
BUN: 13 mg/dL (ref 6–20)
CO2: 27 mmol/L (ref 22–32)
Calcium: 9.2 mg/dL (ref 8.9–10.3)
Chloride: 101 mmol/L (ref 98–111)
Creatinine, Ser: 1.08 mg/dL (ref 0.61–1.24)
GFR calc Af Amer: >60 mL/min (ref >60)
GFR calc non Af Amer: >60 mL/min (ref >60)
Glucose, Bld: 113 mg/dL — ABNORMAL HIGH (ref 70–99)
Potassium: 3.8 mmol/L (ref 3.5–5.1)
Sodium: 139 mmol/L (ref 135–145)
Total Bilirubin: 0.9 mg/dL (ref 0.3–1.2)
Total Protein: 7.5 g/dL (ref 6.5–8.1)

## 2019-02-03 LAB — ACETAMINOPHEN LEVEL: Acetaminophen (Tylenol), Serum: <10 ug/mL — ABNORMAL LOW (ref 10–30)

## 2019-02-03 LAB — SARS CORONAVIRUS 2 BY RT PCR (HOSPITAL ORDER, PERFORMED IN ~~LOC~~ HOSPITAL LAB): SARS Coronavirus 2: NEGATIVE

## 2019-02-03 LAB — ETHANOL: Alcohol, Ethyl (B): <10 mg/dL (ref <10)

## 2019-02-03 LAB — SALICYLATE LEVEL: Salicylate Lvl: <7 mg/dL (ref 2.8–30.0)

## 2019-02-03 MED ORDER — QUETIAPINE FUMARATE 100 MG PO TABS: 100.0000 mg | ORAL_TABLET | Freq: Every day | ORAL | 0 refills | Status: DC

## 2019-02-03 MED ORDER — QUETIAPINE FUMARATE 100 MG PO TABS: 100.0000 mg | ORAL_TABLET | Freq: Every day | ORAL | 2 refills | Status: DC

## 2019-02-03 NOTE — Consult Note (Addendum)
[facility] Psych ED Discharge  02/03/2019 1:16 PM Nicolaos Mitrano  MRN:  [id number] Principal Problem: Adjustment disorder with depressed mood Discharge Diagnoses: Principal Problem:   Adjustment disorder with depressed mood   Subjective: Patient reports he came to the hospital related to feeling overwhelmed.  Recent death of his mother and grandmother and unable to see his daughter who is in foster care related to COVID 54.  Patient states that he is feeling better this morning and denies suicidal/self-harm/homicidal ideation, psychosis, and paranoia.   Patient seen via tele psych by this provider, Dr. Mariea Clonts; and chart reviewed and consulted with Dr. Mariea Clonts on 02/03/19.  On evaluation Habeeb Puertas reports he is currently living with his sister who is supportive; works in Biomedical scientist.  Patient recent use of cocaine and alcohol increased depression and making him feel overwhelmed.  Patient states that he has had prior outpatient psychiatric services and medications which he has not taken since May.  States he is interested in restarting medication and outpatient services again; also grief counseling since he did not after the death of his mother and grandmother.  Patient reports feeling much better today.      During evaluation Ladd Tousley is sitting up in bed; he is alert/oriented x 4; calm/cooperative; and mood congruent with affect.  Patient is speaking in a clear tone at moderate volume, and normal pace; with good eye contact.  His thought process is coherent and relevant; There is no indication that he is currently responding to internal/external stimuli or experiencing delusional thought content.  Patient denies suicidal/self-harm/homicidal ideation, psychosis, and paranoia.  Patient has remained calm throughout assessment and has answered questions appropriately.    Total Time spent with patient: 30 minutes  Past Psychiatric History: Bipolar disorder  Past Medical History:  Past  Medical History:  Diagnosis Date  . Boil    History reviewed. No pertinent surgical history. Family History:  Family History  Problem Relation Age of Onset  . Drug abuse Mother    Family Psychiatric  History: See above list Social History:  Social History   Substance and Sexual Activity  Alcohol Use Yes   Comment: occ     Social History   Substance and Sexual Activity  Drug Use Not Currently  . Types: Marijuana, Cocaine    Social History   Socioeconomic History  . Marital status: Single    Spouse name: Not on file  . Number of children: Not on file  . Years of education: Not on file  . Highest education level: Not on file  Occupational History  . Not on file  Social Needs  . Financial resource strain: Not on file  . Food insecurity    Worry: Not on file    Inability: Not on file  . Transportation needs    Medical: Not on file    Non-medical: Not on file  Tobacco Use  . Smoking status: Current Every Day Smoker    Packs/day: 0.50    Types: Cigarettes  . Smokeless tobacco: Never Used  Substance and Sexual Activity  . Alcohol use: Yes    Comment: occ  . Drug use: Not Currently    Types: Marijuana, Cocaine  . Sexual activity: Not on file  Lifestyle  . Physical activity    Days per week: Not on file    Minutes per session: Not on file  . Stress: Not on file  Relationships  . Social connections    Talks on phone: Not on file  Gets together: Not on file    Attends religious service: Not on file    Active member of club or organization: Not on file    Attends meetings of clubs or organizations: Not on file    Relationship status: Not on file  Other Topics Concern  . Not on file  Social History Narrative  . Not on file    Has this patient used any form of tobacco in the last 30 days? (Cigarettes, Smokeless Tobacco, Cigars, and/or Pipes) A prescription for an FDA-approved tobacco cessation medication was offered at discharge and the patient  refused  Current Medications: No current facility-administered medications for this encounter.    Current Outpatient Medications  Medication Sig Dispense Refill  . QUEtiapine (SEROQUEL) 100 MG tablet Take 1 tablet (100 mg total) by mouth at bedtime. For mood 30 tablet 2   PTA Medications: (Not in a hospital admission)   Musculoskeletal: Strength & Muscle Tone: within normal limits Gait & Station: normal Patient leans: N/A  Psychiatric Specialty Exam: Physical Exam  Nursing note and vitals reviewed. Constitutional: He is oriented to person, place, and time. No distress.  HENT:  Head: Normocephalic and atraumatic.  Neck: Normal range of motion.  Respiratory: Effort normal.  Musculoskeletal: Normal range of motion.  Neurological: He is alert and oriented to person, place, and time.  Psychiatric: His speech is normal and behavior is normal. Judgment and thought content normal. Cognition and memory are normal. He exhibits a depressed mood.    Review of Systems  Psychiatric/Behavioral: Positive for substance abuse. Depression: Stable. Hallucinations: Denies. Suicidal ideas: Denies. Nervous/anxious: Stable.   All other systems reviewed and are negative.   Blood pressure 127/77, pulse 68, temperature 97.9 F (36.6 C), temperature source Oral, resp. rate 12, height 5\' 11"  (1.803 m), weight 72.6 kg, SpO2 98 %.Body mass index is 22.32 kg/m.  General Appearance: Casual  Eye Contact:  Good  Speech:  Clear and Coherent and Normal Rate  Volume:  Normal  Mood:  Appropriate  Affect:  Appropriate and Congruent  Thought Process:  Coherent, Goal Directed and Descriptions of Associations: Intact  Orientation:  Full (Time, Place, and Person)  Thought Content:  WDL  Suicidal Thoughts:  No  Homicidal Thoughts:  No  Memory:  Immediate;   Good Recent;   Good Remote;   Good  Judgement:  Intact  Insight:  Present  Psychomotor Activity:  Normal  Concentration:  Concentration: Good  Recall:   Good  Fund of Knowledge:  Good  Language:  Good  Akathisia:  No  Handed:  Right  AIMS (if indicated):   N/A  Assets:  Communication Skills Desire for Improvement Housing Physical Health Social Support  ADL's:  Intact  Cognition:  WNL  Sleep:   N/A     Demographic Factors:  NA  Loss Factors: Recent death of mother and grandmother  Historical Factors: Family history of mental illness or substance abuse  Risk Reduction Factors:   Responsible for children under 27 years of age, Living with another person, especially a relative and Positive social support  Continued Clinical Symptoms:  Previous Psychiatric Diagnoses and Treatments  Cognitive Features That Contribute To Risk:  None    Suicide Risk:  Minimal: No identifiable suicidal ideation.  Patients presenting with no risk factors but with morbid ruminations; may be classified as minimal risk based on the severity of the depressive symptoms  Follow-up Information    Schedule an appointment as soon as possible for a visit  with [facility].   Specialty: Behavioral Health Contact information: [address] [place] [id number] [phone]        [facility]-EMERGENCY DEPT.   Specialty: Emergency Medicine Why: Return to ER for any new or worsening symptoms Contact information: [address] [id number] [place] [id number] [phone]          Plan Of Care/Follow-up recommendations:  Activity:  As tolereated Diet:  Heart healthy Other:  Follow up with [facility]  Disposition: No evidence of imminent risk to self or others at present.   Patient does not meet criteria for psychiatric inpatient admission. Supportive therapy provided about ongoing stressors. Discussed crisis plan, support from social network, calling 911, coming to the Emergency Department, and calling Suicide Hotline.  Restarted on Seroquel 100 mg Qhs.  Will need a prescription.     Shuvon Rankin, NP 02/03/2019, 1:16 PM   Patient seen by telemedicine, chart reviewed and case discussed with the physician extender and developed treatment plan. Reviewed the information documented and agree with the treatment plan.  Juanetta BeetsJacqueline Jamiyla Ishee, DO 02/03/19 3:26 PM

## 2019-02-03 NOTE — ED Notes (Signed)
Belongings moved to locker 32: One hospital bag.

## 2019-02-03 NOTE — H&P (Signed)
Behavioral Health Medical Screening Exam  Daniel [name] is an 28 y.o.black male.who presents with identitical history to Daniel [name] admission for Hx of bipolar (?Substance induced) and Coacaine;alcohol and THC abuse/dependencies. Pt did go to [facility] for treatment after D/C from [facility] 4/28 but says he "didnt finish" treatment because he "was worried about his daughter". He reports SOB x 2 days..  Total Time spent with patient: 15 minutes  Psychiatric Specialty Exam: Physical Exam  Constitutional: He is oriented to person, place, and time. He appears well-developed and well-nourished.  HENT:  Head: Normocephalic and atraumatic.  Eyes: Pupils are equal, round, and reactive to light. EOM are normal.  Neck: Normal range of motion. No tracheal deviation present.  Cardiovascular: Regular rhythm.  Tachycardia  Respiratory: No stridor. No respiratory distress. He has no wheezes.  C/o SOB  GI:  Deferred  Genitourinary:    Genitourinary Comments: Deferred   Musculoskeletal: Normal range of motion.     Comments: C/o Back Pain  Neurological: He is alert and oriented to person, place, and time. No cranial nerve deficit. Coordination normal.  Skin: No rash noted. No erythema. No pallor.  Psychiatric:  See MSE    Review of Systems  Constitutional: Positive for malaise/fatigue. Negative for chills, diaphoresis, fever and weight loss.  HENT: Negative for congestion, ear discharge, ear pain, hearing loss, nosebleeds, sinus pain, sore throat and tinnitus.   Eyes: Negative for blurred vision, double vision, photophobia, pain, discharge and redness.  Respiratory: Positive for shortness of breath (2 days). Negative for cough, hemoptysis, sputum production, wheezing and stridor.   Cardiovascular: Negative for chest pain, palpitations, orthopnea, claudication, leg swelling and PND.  Gastrointestinal: Negative for abdominal pain, blood in stool, constipation, diarrhea, heartburn, melena, nausea and vomiting.   Genitourinary: Negative for dysuria, flank pain, frequency, hematuria and urgency.  Musculoskeletal: Positive for back pain. Negative for falls, joint pain, myalgias and neck pain.  Skin: Negative for itching and rash.  Neurological: Negative for dizziness, tingling, tremors, sensory change, speech change, focal weakness, seizures, loss of consciousness, weakness and headaches.  Endo/Heme/Allergies: Negative for environmental allergies and polydipsia. Does not bruise/bleed easily.  Psychiatric/Behavioral: Positive for depression, hallucinations, memory loss, substance abuse and suicidal ideas. The patient is nervous/anxious and has insomnia.     Blood pressure 126/86, pulse 71, temperature 98.4 F (36.9 C), temperature source Oral, resp. rate 20, SpO2 100 %.There is no height or weight on file to calculate BMI.  General Appearance: Casual and blood shot eyes/eating snack  Eye Contact:  Good  Speech:  Clear and Coherent  Volume:  Normal  Mood:  Dysphoric and Irritable  Affect:  Congruent  Thought Process:  Coherent, Goal Directed and Descriptions of Associations: Intact  Orientation:  Full (Time, Place, and Person)  Thought Content:  Logical, Illogical, Obsessions and Rumination  Suicidal Thoughts:  Yes.  with intent/plan  Homicidal Thoughts:  Yes.  without intent/plan  Memory:  not tested  Judgement:  Impaired  Insight:  Lacking  Psychomotor Activity:  Normal  Concentration: Concentration: Good and Attention Span: Good  Recall:  Negative  Fund of Knowledge:Poor  Language: WNL  Akathisia:  Negative  Handed:  Right  AIMS (if indicated):     Assets:  Housing Social Support Vocational/Educational  Sleep:  Number of Hours: 6.75    Musculoskeletal: Strength & Muscle Tone: grossly WNL c/o Back pain and SOB Gait & Station: normal Patient leans: N/A  Blood pressure 126/86, pulse 71, temperature 98.4 F (36.9 C), temperature source Oral, resp.  rate 20, SpO2 100  %.  Recommendations:  Based on my evaluation the patient appears to have an emergency medical condition for which I recommend the patient be transferred to the emergency department for further evaluation.  Maryjean Mornharles Lamia Mariner, PA-C 02/03/2019, 3:58 AM
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
## 2019-02-03 NOTE — Discharge Instructions (Signed)
For your behavioral health needs, you are advised to follow up with [facility].  Call them at your earliest opportunity to schedule an intake appointment:       [facility] [place], [place] [address]      [phone]  When facing the loss of a loved one, many people benefit from seeing a grief counselor.  Contact the [facility] at your earliest opportunity to ask about enrolling in their program:       [facility]      [address] [place], [place] [address]      [phone]

## 2019-02-03 NOTE — ED Triage Notes (Signed)
Pt arrived stating he was told he needing to come here to be medically cleared before admission to [facility]. Pt states he had shortness of breath three days ago but denies any difficulty now. States he was to be seen at [facility] because he feels like he may hurt himself or others but is declining any SI or HI at this time.

## 2019-02-03 NOTE — ED Notes (Signed)
Patients belongings placed in 16-18 cabinet.

## 2019-02-03 NOTE — ED Notes (Signed)
Pt discharged home. Discharged instructions read to pt who verbalized understanding. All belongings returned to pt. Denies SI/HI, is not delusional and not responding to internal stimuli. Escorted pt to the ED exit.   

## 2019-02-03 NOTE — ED Notes (Signed)
Extra blue and dark green sent to lab

## 2019-02-03 NOTE — ED Notes (Signed)
Pt dressed out in burgundy scrubs and yellow socks, wanded by security and belongings bagged and placed behind nurses station.

## 2019-02-03 NOTE — ED Provider Notes (Signed)
[facility]-EMERGENCY DEPT Provider Note   CSN: [id number] Arrival date & time: 02/03/19  0423    History   Chief Complaint Chief Complaint  Patient presents with  . Medical Clearance    HPI Wende BushyShaquille Minniear is a 28 y.o. male.     28 year old male presents to the emergency department from behavioral health.  He has been recommended for ongoing psychiatric assessment.  Sent to the ED for medical clearance as well as for further evaluation of shortness of breath.  Patient states that his shortness of breath has resolved and he is presently asymptomatic.  Notes onset of shortness of breath after use of cocaine yesterday morning.  Patient denies suicidal and homicidal thoughts in triage.  The history is provided by the patient. No language interpreter was used.    Past Medical History:  Diagnosis Date  . Boil     Patient Active Problem List   Diagnosis Date Noted  . MDD (major depressive disorder), severe (HCC) 11/28/2018  . Cocaine abuse with cocaine-induced mood disorder (HCC) 07/22/2018  . Bipolar disorder, curr episode mixed, severe, with psychotic features (HCC) 01/30/2017  . Cocaine use disorder, moderate, dependence (HCC) 01/30/2017  . Cannabis use disorder, severe, dependence (HCC) 01/30/2017    No past surgical history on file.      Home Medications    Prior to Admission medications   Medication Sig Start Date End Date Taking? Authorizing Provider  citalopram (CELEXA) 10 MG tablet Take 3 tablets (30 mg total) by mouth daily. For mood Patient not taking: Reported on 02/03/2019 12/03/18   Aldean BakerSykes, Janet E, NP  hydrOXYzine (ATARAX/VISTARIL) 25 MG tablet Take 1 tablet (25 mg total) by mouth 3 (three) times daily. For anxiety Patient not taking: Reported on 02/03/2019 12/02/18   Aldean BakerSykes, Janet E, NP  QUEtiapine (SEROQUEL) 100 MG tablet Take 1 tablet (100 mg total) by mouth at bedtime. For mood Patient not taking: Reported on 02/03/2019 12/02/18    Aldean BakerSykes, Janet E, NP  traZODone (DESYREL) 100 MG tablet Take 1 tablet (100 mg total) by mouth at bedtime. For sleep Patient not taking: Reported on 02/03/2019 12/02/18   Aldean BakerSykes, Janet E, NP    Family History Family History  Problem Relation Age of Onset  . Drug abuse Mother     Social History Social History   Tobacco Use  . Smoking status: Current Every Day Smoker    Packs/day: 0.50    Types: Cigarettes  . Smokeless tobacco: Never Used  Substance Use Topics  . Alcohol use: Yes    Comment: occ  . Drug use: Not Currently    Types: Marijuana, Cocaine     Allergies   Patient has no known allergies.   Review of Systems Review of Systems Ten systems reviewed and are negative for acute change, except as noted in the HPI.    Physical Exam Updated Vital Signs BP (!) 140/93 (BP Location: Left Arm)   Pulse 71   Temp 98.7 F (37.1 C) (Oral)   Resp 18   Ht 5\' 11"  (1.803 m)   Wt 72.6 kg   SpO2 100%   BMI 22.32 kg/m   Physical Exam Vitals signs and nursing note reviewed.  Constitutional:      General: He is not in acute distress.    Appearance: He is well-developed. He is not diaphoretic.     Comments: Patient sleeping, in no distress.  Nontoxic.  HENT:     Head: Normocephalic and atraumatic.  Eyes:  General: No scleral icterus.    Conjunctiva/sclera: Conjunctivae normal.  Neck:     Musculoskeletal: Normal range of motion.  Pulmonary:     Effort: Pulmonary effort is normal. No respiratory distress.     Breath sounds: No stridor. No wheezing.     Comments: Respirations even and unlabored Musculoskeletal: Normal range of motion.  Skin:    General: Skin is warm and dry.     Coloration: Skin is not pale.     Findings: No erythema or rash.  Neurological:     Mental Status: He is alert and oriented to person, place, and time.  Psychiatric:        Behavior: Behavior normal.      ED Treatments / Results  Labs (all labs ordered are listed, but only abnormal  results are displayed) Labs Reviewed  CBC WITH DIFFERENTIAL/PLATELET - Abnormal; Notable for the following components:      Result Value   WBC 12.2 (*)    All other components within normal limits  COMPREHENSIVE METABOLIC PANEL - Abnormal; Notable for the following components:   Glucose, Bld 113 (*)    All other components within normal limits  ACETAMINOPHEN LEVEL - Abnormal; Notable for the following components:   Acetaminophen (Tylenol), Serum <10 (*)    All other components within normal limits  RAPID URINE DRUG SCREEN, HOSP PERFORMED - Abnormal; Notable for the following components:   Cocaine POSITIVE (*)    Tetrahydrocannabinol POSITIVE (*)    All other components within normal limits  SARS CORONAVIRUS 2 (HOSPITAL ORDER, [facility])  SALICYLATE LEVEL  ETHANOL    EKG None  Radiology Dg Chest Port 1 View  Result Date: 02/03/2019 CLINICAL DATA:  Shortness of breath. Clearance before psychiatric admission. EXAM: PORTABLE CHEST 1 VIEW COMPARISON:  None. FINDINGS: Normal heart size and mediastinal contours. No acute infiltrate or edema. No effusion or pneumothorax. No acute osseous findings. [place] about the left glenohumeral joint. IMPRESSION: Negative chest. Electronically Signed   By: Monte Fantasia M.D.   On: 02/03/2019 05:46    Procedures Procedures (including critical care time)  Medications Ordered in ED Medications - No data to display   Initial Impression / Assessment and Plan / ED Course  I have reviewed the triage vital signs and the nursing notes.  Pertinent labs & imaging results that were available during my care of the patient were reviewed by me and considered in my medical decision making (see chart for details).        28 year old male presents to the emergency department for medical clearance.  Was evaluated at behavioral health earlier this evening.  Recommended for ongoing psychiatric assessment.  There is concerned about  complaints of shortness of breath.  Patient states that this began after using cocaine.  He is asymptomatic at this time.  Afebrile.  No hypoxia.  Work-up in the emergency department notable only for positive UDS.  Use of illicit substances likely also contributing to leukocytosis.  Coronavirus test is negative.  Patient medically cleared at this time.  Pending additional psychiatric recommendations.  Disposition to be determined by oncoming ED provider.   Final Clinical Impressions(s) / ED Diagnoses   Final diagnoses:  Pol[facility]ubstance abuse [facility]    ED Discharge Orders    None       Antonietta Breach, PA-C 02/03/19 [place], [place], MD 02/03/19 [phone]

## 2019-02-03 NOTE — ED Notes (Signed)
Bed: AT55 Expected date:  Expected time:  Means of arrival:  Comments: Hold for room 17

## 2019-02-03 NOTE — BH Assessment (Signed)
[facility] Assessment Progress Note  Per Buford Dresser, DO, this pt does not require psychiatric hospitalization at this time.  Pt is to be discharged from [facility] with referral information for [facility], and for the [employer].  This has been included in pt's discharge instructions.  Pt's nurse, Diane, has been notified.  Jalene Mullet, [place] Triage Specialist [phone]

## 2019-04-08 ENCOUNTER — Emergency Department (HOSPITAL_COMMUNITY): Payer: Self-pay

## 2019-04-08 ENCOUNTER — Emergency Department (HOSPITAL_COMMUNITY)
Admission: EM | Admit: 2019-04-08 | Discharge: 2019-04-08 | Disposition: A | Discharge status: Home / Self Care | Payer: Self-pay | Attending: Emergency Medicine | Admitting: Emergency Medicine

## 2019-04-08 ENCOUNTER — Other Ambulatory Visit: Payer: Self-pay

## 2019-04-08 DIAGNOSIS — Y939 Activity, unspecified: Secondary | ICD-10-CM | POA: Insufficient documentation

## 2019-04-08 DIAGNOSIS — Z23 Encounter for immunization: Secondary | ICD-10-CM | POA: Insufficient documentation

## 2019-04-08 DIAGNOSIS — S0181XA Laceration without foreign body of other part of head, initial encounter: Secondary | ICD-10-CM | POA: Insufficient documentation

## 2019-04-08 DIAGNOSIS — F17210 Nicotine dependence, cigarettes, uncomplicated: Secondary | ICD-10-CM | POA: Insufficient documentation

## 2019-04-08 DIAGNOSIS — S0990XA Unspecified injury of head, initial encounter: Secondary | ICD-10-CM | POA: Insufficient documentation

## 2019-04-08 DIAGNOSIS — F1410 Cocaine abuse, uncomplicated: Secondary | ICD-10-CM | POA: Insufficient documentation

## 2019-04-08 DIAGNOSIS — Y999 Unspecified external cause status: Secondary | ICD-10-CM | POA: Insufficient documentation

## 2019-04-08 DIAGNOSIS — F1910 Other psychoactive substance abuse, uncomplicated: Secondary | ICD-10-CM | POA: Insufficient documentation

## 2019-04-08 DIAGNOSIS — F10129 Alcohol abuse with intoxication, unspecified: Secondary | ICD-10-CM | POA: Insufficient documentation

## 2019-04-08 DIAGNOSIS — Y929 Unspecified place or not applicable: Secondary | ICD-10-CM | POA: Insufficient documentation

## 2019-04-08 LAB — CBC
HCT: 45.7 % (ref 39.0–52.0)
Hemoglobin: 15.7 g/dL (ref 13.0–17.0)
MCH: 31.6 pg (ref 26.0–34.0)
MCHC: 34.4 g/dL (ref 30.0–36.0)
MCV: 92 fL (ref 80.0–100.0)
Platelets: 227 10*3/uL (ref 150–400)
RBC: 4.97 MIL/uL (ref 4.22–5.81)
RDW: 12.3 % (ref 11.5–15.5)
WBC: 9.2 10*3/uL (ref 4.0–10.5)
nRBC: 0 % (ref 0.0–0.2)

## 2019-04-08 LAB — COMPREHENSIVE METABOLIC PANEL
ALT: 14 U/L (ref 0–44)
AST: 40 U/L (ref 15–41)
Albumin: 4.2 g/dL (ref 3.5–5.0)
Alkaline Phosphatase: 61 U/L (ref 38–126)
Anion gap: 7 (ref 5–15)
BUN: 7 mg/dL (ref 6–20)
CO2: 29 mmol/L (ref 22–32)
Calcium: 9.1 mg/dL (ref 8.9–10.3)
Chloride: 94 mmol/L — ABNORMAL LOW (ref 98–111)
Creatinine, Ser: 1 mg/dL (ref 0.61–1.24)
GFR calc Af Amer: >60 mL/min (ref >60)
GFR calc non Af Amer: >60 mL/min (ref >60)
Glucose, Bld: 122 mg/dL — ABNORMAL HIGH (ref 70–99)
Potassium: 3.6 mmol/L (ref 3.5–5.1)
Sodium: 130 mmol/L — ABNORMAL LOW (ref 135–145)
Total Bilirubin: 1.3 mg/dL — ABNORMAL HIGH (ref 0.3–1.2)
Total Protein: 6.9 g/dL (ref 6.5–8.1)

## 2019-04-08 LAB — RAPID URINE DRUG SCREEN, HOSP PERFORMED
Amphetamines: POSITIVE — AB
Barbiturates: NOT DETECTED
Benzodiazepines: NOT DETECTED
Cocaine: POSITIVE — AB
Opiates: POSITIVE — AB
Tetrahydrocannabinol: POSITIVE — AB

## 2019-04-08 LAB — CBG MONITORING, ED: Glucose-Capillary: 98 mg/dL (ref 70–99)

## 2019-04-08 LAB — ETHANOL: Alcohol, Ethyl (B): <10 mg/dL (ref <10)

## 2019-04-08 MED ORDER — ACETAMINOPHEN 500 MG PO TABS
1000.0000 mg | ORAL_TABLET | Freq: Once | ORAL | Status: AC
  Administered 2019-04-08: 1000 mg via ORAL

## 2019-04-08 MED ORDER — TETANUS-DIPHTH-ACELL PERTUSSIS 5-2.5-18.5 LF-MCG/0.5 IM SUSP
0.5000 mL | Freq: Once | INTRAMUSCULAR | Status: AC
  Administered 2019-04-08: 03:00:00 0.5 mL via INTRAMUSCULAR
  Filled 2019-04-08: qty 0.5

## 2019-04-08 NOTE — ED Notes (Signed)
Patient transported to X-ray 
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
## 2019-04-08 NOTE — ED Notes (Signed)
Patient verbalizes understanding of discharge instructions. Opportunity for questioning and answers were provided. Armband removed by staff, pt discharged from ED.  

## 2019-04-08 NOTE — ED Notes (Signed)
Pt ambulated to bathroom and back without incident.

## 2019-04-08 NOTE — Discharge Instructions (Addendum)
You may alternate Tylenol 1000 mg every 6 hours as needed for pain and Ibuprofen 800 mg every 8 hours as needed for pain.  Please take Ibuprofen with food.  You may apply ice to your face to help with pain and swelling.  CT scan of your head, neck, face and x-rays of your spine, chest were normal today other than soft tissue swelling and bruising to your face.  You had no fractures or bleeding within your brain.  I recommend that you stop abusing drugs and alcohol.  I have provided you with outpatient resources for detox/rehab.   Steps to find a Primary Care Provider (PCP):  Call [phone] or [phone] to access "[place] [facility]."  2.  You may also go on the [facility] website at [email or web address]  3.  [facility] also frequently accepts new patients.  [place] [phone]  4.  There are also multiple [facility], [facility] and [facility] practices throughout the [place] that are frequently accepting new patients. You may find a clinic that is close to your home and contact them.  [facility] [email or web address] [phone]  [facility] [email or web address]  [facility] [email or web address] [place] [email or web address] [phone]  5.  Local Health Departments also can provide primary care services.  [facility]  [place] [address] [phone]  [facility] [place] [address] [place] [phone]

## 2019-04-08 NOTE — ED Triage Notes (Signed)
Patient placed on oxygen via [place] @ 2lpm. O2 sat @ 93%
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
## 2019-04-17 ENCOUNTER — Emergency Department (HOSPITAL_COMMUNITY)
Admission: EM | Admit: 2019-04-17 | Discharge: 2019-04-17 | Disposition: A | Discharge status: Home / Self Care | Payer: Self-pay | Attending: Emergency Medicine | Admitting: Emergency Medicine

## 2019-04-17 ENCOUNTER — Other Ambulatory Visit: Payer: Self-pay

## 2019-04-17 ENCOUNTER — Encounter (HOSPITAL_COMMUNITY): Payer: Self-pay | Admitting: Emergency Medicine

## 2019-04-17 DIAGNOSIS — F1990 Other psychoactive substance use, unspecified, uncomplicated: Secondary | ICD-10-CM | POA: Insufficient documentation

## 2019-04-17 DIAGNOSIS — F329 Major depressive disorder, single episode, unspecified: Secondary | ICD-10-CM | POA: Insufficient documentation

## 2019-04-17 DIAGNOSIS — F17210 Nicotine dependence, cigarettes, uncomplicated: Secondary | ICD-10-CM | POA: Insufficient documentation

## 2019-04-17 DIAGNOSIS — R45851 Suicidal ideations: Secondary | ICD-10-CM | POA: Insufficient documentation

## 2019-04-17 LAB — RAPID URINE DRUG SCREEN, HOSP PERFORMED
Amphetamines: POSITIVE — AB
Barbiturates: NOT DETECTED
Benzodiazepines: NOT DETECTED
Cocaine: POSITIVE — AB
Opiates: NOT DETECTED
Tetrahydrocannabinol: POSITIVE — AB

## 2019-04-17 LAB — COMPREHENSIVE METABOLIC PANEL
ALT: 10 U/L (ref 0–44)
AST: 18 U/L (ref 15–41)
Albumin: 4.2 g/dL (ref 3.5–5.0)
Alkaline Phosphatase: 59 U/L (ref 38–126)
Anion gap: 12 (ref 5–15)
BUN: 7 mg/dL (ref 6–20)
CO2: 25 mmol/L (ref 22–32)
Calcium: 8.8 mg/dL — ABNORMAL LOW (ref 8.9–10.3)
Chloride: 99 mmol/L (ref 98–111)
Creatinine, Ser: 1.01 mg/dL (ref 0.61–1.24)
GFR calc Af Amer: >60 mL/min (ref >60)
GFR calc non Af Amer: >60 mL/min (ref >60)
Glucose, Bld: 101 mg/dL — ABNORMAL HIGH (ref 70–99)
Potassium: 3.4 mmol/L — ABNORMAL LOW (ref 3.5–5.1)
Sodium: 136 mmol/L (ref 135–145)
Total Bilirubin: 1.1 mg/dL (ref 0.3–1.2)
Total Protein: 7.3 g/dL (ref 6.5–8.1)

## 2019-04-17 LAB — CBC
HCT: 46.9 % (ref 39.0–52.0)
Hemoglobin: 15.8 g/dL (ref 13.0–17.0)
MCH: 32.2 pg (ref 26.0–34.0)
MCHC: 33.7 g/dL (ref 30.0–36.0)
MCV: 95.5 fL (ref 80.0–100.0)
Platelets: 273 10*3/uL (ref 150–400)
RBC: 4.91 MIL/uL (ref 4.22–5.81)
RDW: 12.5 % (ref 11.5–15.5)
WBC: 7.1 10*3/uL (ref 4.0–10.5)
nRBC: 0 % (ref 0.0–0.2)

## 2019-04-17 LAB — ETHANOL: Alcohol, Ethyl (B): <10 mg/dL (ref <10)

## 2019-04-17 NOTE — ED Provider Notes (Signed)
[facility]-EMERGENCY DEPT Provider Note   CSN: [id number] Arrival date & time: 04/17/19  1014     History   Chief Complaint Chief Complaint  Patient presents with  . Suicidal    HPI Daniel [name] is a 28 y.o. male.     28 year old male with prior medical history as detailed below presents for evaluation of his mental health.  Patient reports longstanding history of polysubstance abuse.  Patient reports current suicidality.  He reports a plan to possibly overdose.  He has not done so.  He reports that his last use of a substance was last night.  He has difficulty maintaining eye contact.  His sentences are short.  He is unable to provide -or is unwilling - additional significant details.  The history is provided by the patient and medical records.  Mental Health Problem Presenting symptoms: suicidal thoughts   Degree of incapacity (severity):  Mild Onset quality:  Unable to specify Timing:  Constant Progression:  Worsening Context: alcohol use and drug abuse   Relieved by:  Nothing Ineffective treatments:  None tried   Past Medical History:  Diagnosis Date  . Boil     Patient Active Problem List   Diagnosis Date Noted  . Adjustment disorder with depressed mood 02/03/2019  . MDD (major depressive disorder), severe (HCC) 11/28/2018  . Cocaine abuse with cocaine-induced mood disorder (HCC) 07/22/2018  . Bipolar disorder, curr episode mixed, severe, with psychotic features (HCC) 01/30/2017  . Cocaine use disorder, moderate, dependence (HCC) 01/30/2017  . Cannabis use disorder, severe, dependence (HCC) 01/30/2017    No past surgical history on file.      Home Medications    Prior to Admission medications   Medication Sig Start Date End Date Taking? Authorizing Provider  QUEtiapine (SEROQUEL) 100 MG tablet Take 1 tablet (100 mg total) by mouth at bedtime. For mood Patient not taking: Reported on 04/17/2019 02/03/19   Azalia Bilisampos, Kevin, MD     Family History Family History  Problem Relation Age of Onset  . Drug abuse Mother     Social History Social History   Tobacco Use  . Smoking status: Current Every Day Smoker    Packs/day: 0.50    Types: Cigarettes  . Smokeless tobacco: Never Used  Substance Use Topics  . Alcohol use: Yes    Comment: occ  . Drug use: Yes    Types: Marijuana, Cocaine    Comment: heroin     Allergies   Patient has no known allergies.   Review of Systems Review of Systems  Psychiatric/Behavioral: Positive for suicidal ideas.  All other systems reviewed and are negative.    Physical Exam Updated Vital Signs BP (!) 141/97 (BP Location: Right Arm)   Pulse 68   Temp 98.2 F (36.8 C) (Oral)   Resp 17   SpO2 100%   Physical Exam Vitals signs and nursing note reviewed.  Constitutional:      General: He is not in acute distress.    Appearance: Normal appearance. He is well-developed.  HENT:     Head: Normocephalic and atraumatic.  Eyes:     Conjunctiva/sclera: Conjunctivae normal.     Pupils: Pupils are equal, round, and reactive to light.  Neck:     Musculoskeletal: Normal range of motion and neck supple.  Cardiovascular:     Rate and Rhythm: Normal rate and regular rhythm.     Heart sounds: Normal heart sounds.  Pulmonary:     Effort: Pulmonary effort is  normal. No respiratory distress.     Breath sounds: Normal breath sounds.  Abdominal:     General: There is no distension.     Palpations: Abdomen is soft.     Tenderness: There is no abdominal tenderness.  Musculoskeletal: Normal range of motion.        General: No deformity.  Skin:    General: Skin is warm and dry.  Neurological:     General: No focal deficit present.     Mental Status: He is alert and oriented to person, place, and time. Mental status is at baseline.  Psychiatric:     Comments: Endorse SI with plan to overdose       ED Treatments / Results  Labs (all labs ordered are listed, but only abnormal  results are displayed) Labs Reviewed  COMPREHENSIVE METABOLIC PANEL - Abnormal; Notable for the following components:      Result Value   Potassium 3.4 (*)    Glucose, Bld 101 (*)    Calcium 8.8 (*)    All other components within normal limits  RAPID URINE DRUG SCREEN, HOSP PERFORMED - Abnormal; Notable for the following components:   Cocaine POSITIVE (*)    Amphetamines POSITIVE (*)    Tetrahydrocannabinol POSITIVE (*)    All other components within normal limits  SARS CORONAVIRUS 2 (HOSPITAL ORDER, [facility])  ETHANOL  CBC    EKG None  Radiology No results found.  Procedures Procedures (including critical care time)  Medications Ordered in ED Medications - No data to display   Initial Impression / Assessment and Plan / ED Course  I have reviewed the triage vital signs and the nursing notes.  Pertinent labs & imaging results that were available during my care of the patient were reviewed by me and considered in my medical decision making (see chart for details).        MDM  Screen complete  Daniel [name] was evaluated in Emergency Department on 04/17/2019 for the symptoms described in the history of present illness. He was evaluated in the context of the global COVID-19 pandemic, which necessitated consideration that the patient might be at risk for infection with the SARS-CoV-2 virus that causes COVID-19. Institutional protocols and algorithms that pertain to the evaluation of patients at risk for COVID-19 are in a state of rapid change based on information released by regulatory bodies including the CDC and federal and state organizations. These policies and algorithms were followed during the patient's care in the ED.  Patient is presenting for evaluation of reported suicidality.  Patient without overt evidence of significant medical pathology.  He is medically clear for further psychiatric evaluation and treatment.  Final  disposition is dependent upon psychiatric assessment and treatment plan.  1435 Patient has been evaluated by behavioral health services and deemed to be appropriate for outpatient therapy.  Patient to be discharged.   Final Clinical Impressions(s) / ED Diagnoses   Final diagnoses:  Suicidal ideation    ED Discharge Orders    None       Valarie Merino, MD 04/17/19 1437

## 2019-04-17 NOTE — ED Notes (Signed)
Pt DCd off unit to home per provider. Pt alert, calm, cooperative, no s/s of distress. DC information given and reviewed with pt with acknowledged understanding. Pt DC with resources. Pt ambulatory off unit escorted by MHT. Pt transported by bus per pt.

## 2019-04-17 NOTE — BH Assessment (Signed)
[facility] Assessment Progress Note  Per Jinny Blossom, PMHNP , this pt does not require psychiatric hospitalization at this time.  Pt is to be discharged from [facility] with referral information for area substance abuse treatment providers.  This has been included in pt's discharge instructions.  Pt's nurse, Eustaquio Maize, has been notified.  Jalene Mullet, [place] Triage Specialist [phone]

## 2019-04-17 NOTE — Patient Outreach (Signed)
ED Peer Support Specialist Patient Intake (Complete at intake & 30-60 Day Follow-up)  Name: Daniel [name]  MRN: [id number]  Age: 28 y.o.   Date of Admission: 04/17/2019  Intake: Initial Comments:      Primary Reason Admitted: Suicidal Substance Abuse   Lab values: Alcohol/ETOH: Negative Positive UDS? Yes Amphetamines: Yes Barbiturates: No Benzodiazepines: No Cocaine: Yes Opiates: No Cannabinoids: Yes  Demographic information: Gender: Male Ethnicity: African American Marital Status: Single Insurance Status: Uninsured/Self-pay Ecologist (Work Neurosurgeon, Physicist, medical, etc.: Yes Lives with: Alone Living situation: House/Apartment  Reported Patient History: Patient reported health conditions: Depression, Anxiety disorders Patient aware of HIV and hepatitis status: No  In past year, has patient visited ED for any reason? Yes  Number of ED visits: 3  Reason(s) for visit: Same situations  In past year, has patient been hospitalized for any reason? No  Number of hospitalizations:    Reason(s) for hospitalization:    In past year, has patient been arrested? Yes  Number of arrests: 1  Reason(s) for arrest: FPOS  In past year, has patient been incarcerated? No  Number of incarcerations:    Reason(s) for incarceration:    In past year, has patient received medication-assisted treatment? No  In past year, patient received the following treatments: Residential treatment (non-hospital)  In past year, has patient received any harm reduction services? No  Did this include any of the following?    In past year, has patient received care from a mental health provider for diagnosis other than SUD? No  In past year, is this first time patient has overdosed? No  Number of past overdoses:    In past year, is this first time patient has been hospitalized for an overdose? No  Number of hospitalizations for overdose(s):    Is patient  currently receiving treatment for a mental health diagnosis? No  Patient reports experiencing difficulty participating in SUD treatment: Yes    Most important reason(s) for this difficulty? Other (comment)  Has patient received prior services for treatment? No  In past, patient has received services from following agencies:    Plan of Care:  Suggested follow up at these agencies/treatment centers: Other (comment)  Other information: CPSS met with Pt to gain more information to better assist Pt. Pt stated that he is tired and that he wants help with substance abuse. CPSS was able to complete series of question as Pt was falling asleep during interview. CPSS explained to Pt that there are varies facilities that Pt may benefit from there services. CPSS will leave resources an contact information for Pt along with CPSS contact information for support in community.    Aaron Edelman Fabio Wah, [place]  04/17/2019 12:39 PM

## 2019-04-17 NOTE — ED Triage Notes (Signed)
Per pt, states he is suicidal due to chronic drug use-last use last night, heroin and cocaine-states he has been using for 6 years-has been to [facility] but didn't give it enough time

## 2019-04-17 NOTE — Discharge Instructions (Signed)
To help you maintain a sober lifestyle, a substance abuse treatment program may be beneficial to you.  Contact one of the following providers at your earliest opportunity to ask about enrolling:  RESIDENTIAL PROGRAMS:       [facility]      [place], [place] [address]      [phone]       [facility]      [address] [place], [place] [address]      [phone]       [facility]      [place], [place] [address]      [phone]  OUTPATIENT PROGRAMS:       [facility], [place] [address]      [phone]      New patients are seen at their walk-in clinic.  Walk-in hours are Monday - Friday from 8:30 am - 12:00 pm, and from 1:00 pm - 2:30 pm.  Walk-in patients are seen on a first come, first served basis, so try to arrive as early as possible for the best chance of being seen the same day.

## 2019-04-17 NOTE — Progress Notes (Addendum)
Patient ID: Daniel [name], male   DOB: 11/15/1990, 28 y.o.   MRN: [id number]  Pt was seen and chart reviewed with treatment team and Dr Mariea Clonts. Pt denies homicidal ideation, denies auditory/visual hallucinations and does not appear to be responding to internal stimuli. Pt states he is suicidal due to polysubstance abuse. Pt's suicidal ideation are situational in nature due to his continued drug use. Pt does not specify a plan. Pt's UDS positive for amphetamines, cocaine and THC, BAL negative. He has a history of drug use and has been to rehabilitation at [facility] in the past but "did not give it enough time." Today, he is requesting further help with his drug use. He was seen by Peer  Support specialists today and they discussed treatment options in the community. Pt is currently in the [facility] and is refusing his COVID test. According to notes in chart, Pt remains calm and cooperative in the ED. Pt will be provided with substance abuse resources for follow up outpatient. Pt is psychiatrically clear.   Ethelene Hal, PMHNP-BC 04/17/2019           1426  Patient's chart reviewed. Reviewed the information documented and agree with the treatment plan.  Buford Dresser, DO 04/18/19 6:47 PM

## 2019-04-17 NOTE — ED Notes (Signed)
Pt. Refused Covid test.

## 2019-04-17 NOTE — ED Notes (Signed)
Pt to room 36. Pt oriented to unit. Pt calm, cooperative, no s/s of distress. Pt continues to have SI with no current plan, states he was going to overdose. Pt wants help for drugs.

## 2019-06-19 ENCOUNTER — Emergency Department (HOSPITAL_COMMUNITY)
Admission: EM | Admit: 2019-06-19 | Discharge: 2019-06-20 | Disposition: A | Discharge status: Home / Self Care | Payer: Self-pay | Attending: Emergency Medicine | Admitting: Emergency Medicine

## 2019-06-19 ENCOUNTER — Other Ambulatory Visit: Payer: Self-pay

## 2019-06-19 DIAGNOSIS — F10929 Alcohol use, unspecified with intoxication, unspecified: Secondary | ICD-10-CM

## 2019-06-19 DIAGNOSIS — F17210 Nicotine dependence, cigarettes, uncomplicated: Secondary | ICD-10-CM | POA: Insufficient documentation

## 2019-06-19 DIAGNOSIS — F1010 Alcohol abuse, uncomplicated: Secondary | ICD-10-CM

## 2019-06-19 DIAGNOSIS — F10120 Alcohol abuse with intoxication, uncomplicated: Secondary | ICD-10-CM | POA: Insufficient documentation
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
## 2019-06-20 NOTE — Discharge Instructions (Addendum)
Avoid drinking alcohol 

## 2019-06-20 NOTE — ED Notes (Signed)
Pt was verbalized discharge instructions. Pt had no further questions at this time. NAD. 

## 2019-07-26 ENCOUNTER — Emergency Department (HOSPITAL_COMMUNITY)
Admission: EM | Admit: 2019-07-26 | Discharge: 2019-07-26 | Disposition: A | Discharge status: Home / Self Care | Payer: Self-pay | Attending: Emergency Medicine | Admitting: Emergency Medicine

## 2019-07-26 ENCOUNTER — Encounter (HOSPITAL_COMMUNITY): Payer: Self-pay | Admitting: Emergency Medicine

## 2019-07-26 ENCOUNTER — Other Ambulatory Visit: Payer: Self-pay

## 2019-07-26 DIAGNOSIS — Z202 Contact with and (suspected) exposure to infections with a predominantly sexual mode of transmission: Secondary | ICD-10-CM | POA: Insufficient documentation

## 2019-07-26 DIAGNOSIS — R369 Urethral discharge, unspecified: Secondary | ICD-10-CM | POA: Insufficient documentation

## 2019-07-26 DIAGNOSIS — Z711 Person with feared health complaint in whom no diagnosis is made: Secondary | ICD-10-CM

## 2019-07-26 DIAGNOSIS — F17210 Nicotine dependence, cigarettes, uncomplicated: Secondary | ICD-10-CM | POA: Insufficient documentation

## 2019-07-26 LAB — URINALYSIS, ROUTINE W REFLEX MICROSCOPIC
Bilirubin Urine: NEGATIVE
Glucose, UA: NEGATIVE mg/dL
Ketones, ur: 5 mg/dL — AB
Nitrite: NEGATIVE
Protein, ur: 100 mg/dL — AB
Specific Gravity, Urine: 1.025 (ref 1.005–1.030)
pH: 6 (ref 5.0–8.0)

## 2019-07-26 LAB — HIV ANTIBODY (ROUTINE TESTING W REFLEX): HIV Screen 4th Generation wRfx: NONREACTIVE

## 2019-07-26 MED ORDER — LIDOCAINE HCL (PF) 1 % IJ SOLN
INTRAMUSCULAR | Status: AC
  Administered 2019-07-26: 2 mL
  Filled 2019-07-26: qty 5

## 2019-07-26 MED ORDER — AZITHROMYCIN 250 MG PO TABS
1000.0000 mg | ORAL_TABLET | Freq: Once | ORAL | Status: AC
  Administered 2019-07-26: 13:00:00 1000 mg via ORAL
  Filled 2019-07-26: qty 4

## 2019-07-26 MED ORDER — CEFTRIAXONE SODIUM 250 MG IJ SOLR
250.0000 mg | Freq: Once | INTRAMUSCULAR | Status: AC
  Administered 2019-07-26: 250 mg via INTRAMUSCULAR
  Filled 2019-07-26: qty 250

## 2019-07-26 NOTE — ED Triage Notes (Signed)
C/o penile discharge and discomfort when urinating x 3 weeks.  Requesting STD check.

## 2019-07-26 NOTE — ED Provider Notes (Signed)
[facility] EMERGENCY DEPARTMENT Provider Note   CSN: [id number] Arrival date & time: 07/26/19  1203     History Chief Complaint  Patient presents with  . SEXUALLY TRANSMITTED DISEASE    Daniel [name] is a 28 y.o. male with past medical history significant for cocaine use, marijuana use, bipolar disorder who presents for evaluation of penile discharge.  Yellow discharge present x2-3 weeks.  He was previously incarcerated and was unable to seek treatment at that time.  States over the last few days he has some developed some slight burning with urination.  He denies fever, chills, nausea, vomiting, abdominal pain, testicular, penile pain, testicular or penile rashes, lesions, testicular swelling, pain with bowel movements, hematuria, flank pain.  Denies additional aggravating or alleviating factors.  Sexually active with male partners.  History obtained from patient and past medical records.  No interpreter is used.  HPI     Past Medical History:  Diagnosis Date  . Boil     Patient Active Problem List   Diagnosis Date Noted  . Adjustment disorder with depressed mood 02/03/2019  . MDD (major depressive disorder), severe ([place]) 11/28/2018  . Cocaine abuse with cocaine-induced mood disorder ([place]) 07/22/2018  . Bipolar disorder, curr episode mixed, severe, with psychotic features ([place]) 01/30/2017  . Cocaine use disorder, moderate, dependence ([place]) 01/30/2017  . Cannabis use disorder, severe, dependence ([place]) 01/30/2017    History reviewed. No pertinent surgical history.     Family History  Problem Relation Age of Onset  . Drug abuse Mother     Social History   Tobacco Use  . Smoking status: Current Every Day Smoker    Packs/day: 0.50    Types: Cigarettes  . Smokeless tobacco: Never Used  Substance Use Topics  . Alcohol use: Yes    Comment: occ  . Drug use: Yes    Types: Marijuana, Cocaine    Comment: heroin    Home Medications Prior to  Admission medications   Medication Sig Start Date End Date Taking? Authorizing Provider  QUEtiapine (SEROQUEL) 100 MG tablet Take 1 tablet (100 mg total) by mouth at bedtime. For mood Patient not taking: Reported on 04/17/2019 02/03/19   Jola Schmidt, MD    Allergies    Patient has no known allergies.  Review of Systems   Review of Systems  Constitutional: Negative.   HENT: Negative.   Respiratory: Negative.   Cardiovascular: Negative.   Gastrointestinal: Negative.   Genitourinary: Positive for discharge and dysuria. Negative for decreased urine volume, difficulty urinating, enuresis, flank pain, frequency, genital sores, hematuria, penile pain, penile swelling, scrotal swelling, testicular pain and urgency.  Musculoskeletal: Negative.   Skin: Negative.   Neurological: Negative.   All other systems reviewed and are negative.   Physical Exam Updated Vital Signs BP (!) 154/102 (BP Location: Right Arm)   Pulse (!) 102   Temp 97.9 F (36.6 C) (Oral)   Resp 18   SpO2 100%   Physical Exam Vitals and nursing note reviewed. Exam conducted with a chaperone present.  Constitutional:      General: He is not in acute distress.    Appearance: He is well-developed. He is not ill-appearing, toxic-appearing or diaphoretic.  HENT:     Head: Atraumatic.  Eyes:     Pupils: Pupils are equal, round, and reactive to light.  Cardiovascular:     Rate and Rhythm: Normal rate and regular rhythm.  Pulmonary:     Effort: Pulmonary effort is normal. No respiratory  distress.  Abdominal:     General: Bowel sounds are normal. There is no distension.     Palpations: Abdomen is soft.     Tenderness: There is no abdominal tenderness. There is no right CVA tenderness, left CVA tenderness, guarding or rebound.     Hernia: No hernia is present.     Comments: Soft, nontender without rebound or guarding.  Negative CVA tap bilaterally.  Genitourinary:    Penis: Normal.      Testes: Normal. Cremasteric  reflex is present.     Epididymis:     Right: Normal.     Left: Normal.     Comments: Male RN in room during exam.  Yellow discharge to the urethral meatus.  No tenderness palpation to testes.  Tenderness to epididymis.  No rashes or lesions. Musculoskeletal:        General: Normal range of motion.     Cervical back: Normal range of motion and neck supple.  Skin:    General: Skin is warm and dry.     Capillary Refill: Capillary refill takes less than 2 seconds.     Comments: No erythema, warmth.  No vesicles, rashes or lesions.  Neurological:     Mental Status: He is alert.     ED Results / Procedures / Treatments   Labs (all labs ordered are listed, but only abnormal results are displayed) Labs Reviewed  RPR  URINALYSIS, ROUTINE W REFLEX MICROSCOPIC  HIV ANTIBODY (ROUTINE TESTING W REFLEX)  GC/CHLAMYDIA PROBE AMP ([place]) NOT AT [facility]    EKG None  Radiology No results found.  Procedures Procedures (including critical care time)  Medications Ordered in ED Medications  cefTRIAXone (ROCEPHIN) injection 250 mg (has no administration in time range)  azithromycin (ZITHROMAX) tablet 1,000 mg (has no administration in time range)    ED Course  I have reviewed the triage vital signs and the nursing notes.  Pertinent labs & imaging results that were available during my care of the patient were reviewed by me and considered in my medical decision making (see chart for details).  28 year old male presents for evaluation of penile discharge x2-3 weeks.  Was unable to seek care previously due to being incarcerated.  Over last few days has noticed some dysuria.  He is sexually active, intermittent protection.  Sexually active with females.  GU exam with yellow discharge at urethral meatus.  Otherwise benign GU exam.  Patient is afebrile without abdominal tenderness, abdominal pain or painful bowel movements to indicate prostatitis.  No tenderness to palpation of the testes or  epididymis to suggest orchitis or epididymitis.  STD cultures obtained including HIV, syphilis, gonorrhea and chlamydia. Patient to be discharged with instructions to follow up with PCP. Discussed importance of using protection when sexually active. Pt understands that they have GC/Chlamydia cultures pending and that they will need to inform all sexual partners if results return positive. Patient has been treated prophylactically with azithromycin and Rocephin.   The patient has been appropriately medically screened and/or stabilized in the ED. I have low suspicion for any other emergent medical condition which would require further screening, evaluation or treatment in the ED or require inpatient management.     MDM Rules/Calculators/A&P                      Final Clinical Impression(s) / ED Diagnoses Final diagnoses:  Concern about STD in male without diagnosis    Rx / DC Orders ED Discharge Orders  None       Dailyn Reith A, PA-C 07/26/19 1301    Vanetta Mulders, MD 07/27/19 1342

## 2019-07-26 NOTE — Discharge Instructions (Signed)
You will be called if any of your STD testing is positive.  If your HIV or syphilis testing is positive you will need to seek follow-up with infectious disease.  If your gonorrhea or chlamydia test is positive you will need to notify your partners.  You have been treated with antibiotics for gonorrhea and chlamydia if these were positive.  You have any new worsening symptoms please seek reevaluation.

## 2019-07-27 LAB — RPR: RPR Ser Ql: NONREACTIVE

## 2019-11-01 ENCOUNTER — Emergency Department (HOSPITAL_COMMUNITY)
Admission: EM | Admit: 2019-11-01 | Discharge: 2019-11-02 | Disposition: A | Discharge status: Home / Self Care | Payer: Self-pay | Attending: Emergency Medicine | Admitting: Emergency Medicine

## 2019-11-01 ENCOUNTER — Emergency Department (HOSPITAL_COMMUNITY): Payer: Self-pay

## 2019-11-01 DIAGNOSIS — F1310 Sedative, hypnotic or anxiolytic abuse, uncomplicated: Secondary | ICD-10-CM | POA: Insufficient documentation

## 2019-11-01 DIAGNOSIS — F17210 Nicotine dependence, cigarettes, uncomplicated: Secondary | ICD-10-CM | POA: Insufficient documentation

## 2019-11-01 DIAGNOSIS — F1510 Other stimulant abuse, uncomplicated: Secondary | ICD-10-CM | POA: Insufficient documentation

## 2019-11-01 DIAGNOSIS — F1910 Other psychoactive substance abuse, uncomplicated: Secondary | ICD-10-CM | POA: Insufficient documentation

## 2019-11-01 DIAGNOSIS — F1110 Opioid abuse, uncomplicated: Secondary | ICD-10-CM | POA: Insufficient documentation

## 2019-11-01 DIAGNOSIS — M546 Pain in thoracic spine: Secondary | ICD-10-CM | POA: Insufficient documentation

## 2019-11-01 DIAGNOSIS — F1410 Cocaine abuse, uncomplicated: Secondary | ICD-10-CM | POA: Insufficient documentation

## 2019-11-01 DIAGNOSIS — F1210 Cannabis abuse, uncomplicated: Secondary | ICD-10-CM | POA: Insufficient documentation

## 2019-11-01 DIAGNOSIS — Y9389 Activity, other specified: Secondary | ICD-10-CM | POA: Insufficient documentation

## 2019-11-01 DIAGNOSIS — M542 Cervicalgia: Secondary | ICD-10-CM | POA: Insufficient documentation

## 2019-11-01 DIAGNOSIS — S0081XA Abrasion of other part of head, initial encounter: Secondary | ICD-10-CM | POA: Insufficient documentation

## 2019-11-01 DIAGNOSIS — Y998 Other external cause status: Secondary | ICD-10-CM | POA: Insufficient documentation

## 2019-11-01 DIAGNOSIS — Y929 Unspecified place or not applicable: Secondary | ICD-10-CM | POA: Insufficient documentation

## 2019-11-01 LAB — SAMPLE TO BLOOD BANK

## 2019-11-01 LAB — I-STAT CHEM 8, ED
BUN: 5 mg/dL — ABNORMAL LOW (ref 6–20)
Calcium, Ion: 1.04 mmol/L — ABNORMAL LOW (ref 1.15–1.40)
Chloride: 104 mmol/L (ref 98–111)
Creatinine, Ser: 1 mg/dL (ref 0.61–1.24)
Glucose, Bld: 120 mg/dL — ABNORMAL HIGH (ref 70–99)
HCT: 49 % (ref 39.0–52.0)
Hemoglobin: 16.7 g/dL (ref 13.0–17.0)
Potassium: 3.9 mmol/L (ref 3.5–5.1)
Sodium: 139 mmol/L (ref 135–145)
TCO2: 26 mmol/L (ref 22–32)
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
## 2019-11-02 ENCOUNTER — Emergency Department (HOSPITAL_COMMUNITY): Payer: Self-pay

## 2019-11-02 LAB — URINALYSIS, ROUTINE W REFLEX MICROSCOPIC
Bilirubin Urine: NEGATIVE
Glucose, UA: NEGATIVE mg/dL
Hgb urine dipstick: NEGATIVE
Ketones, ur: 5 mg/dL — AB
Leukocytes,Ua: NEGATIVE
Nitrite: NEGATIVE
Protein, ur: 30 mg/dL — AB
Specific Gravity, Urine: 1.016 (ref 1.005–1.030)
pH: 6 (ref 5.0–8.0)

## 2019-11-02 LAB — RAPID URINE DRUG SCREEN, HOSP PERFORMED
Amphetamines: POSITIVE — AB
Barbiturates: NOT DETECTED
Benzodiazepines: POSITIVE — AB
Cocaine: POSITIVE — AB
Opiates: POSITIVE — AB
Tetrahydrocannabinol: POSITIVE — AB

## 2019-11-02 LAB — LACTIC ACID, PLASMA: Lactic Acid, Venous: 4.4 mmol/L (ref 0.5–1.9)

## 2019-11-02 MED ORDER — BACITRACIN ZINC 500 UNIT/GM EX OINT: 1.0000 "application " | TOPICAL_OINTMENT | Freq: Two times a day (BID) | CUTANEOUS | 0 refills | Status: DC

## 2019-11-02 MED ORDER — SODIUM CHLORIDE 0.9 % IV BOLUS
1000.0000 mL | Freq: Once | INTRAVENOUS | Status: AC
  Administered 2019-11-02: 1000 mL via INTRAVENOUS

## 2019-11-02 NOTE — Consult Note (Signed)
Responded to page, pt unavailable, no family present. Staff will page again if further chaplain services needed.   Rev. Zac Torti Chaplain   

## 2019-11-02 NOTE — Discharge Instructions (Signed)
1. Medications: bacitracin, usual home medications 2. Treatment: rest, drink plenty of fluids,  3. Follow Up: Please followup with your primary doctor in 2-3 days for discussion of your diagnoses and further evaluation after today's visit; if you do not have a primary care doctor use the resource guide provided to find one; Please return to the ER for signs of infection or other concerns

## 2020-03-26 ENCOUNTER — Encounter (HOSPITAL_COMMUNITY): Payer: Self-pay | Admitting: Licensed Clinical Social Worker

## 2020-03-26 ENCOUNTER — Ambulatory Visit (INDEPENDENT_AMBULATORY_CARE_PROVIDER_SITE_OTHER): Payer: No Payment, Other | Admitting: Licensed Clinical Social Worker

## 2020-03-26 ENCOUNTER — Other Ambulatory Visit: Payer: Self-pay

## 2020-03-26 DIAGNOSIS — F3130 Bipolar disorder, current episode depressed, mild or moderate severity, unspecified: Secondary | ICD-10-CM | POA: Diagnosis not present
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
## 2020-04-09 ENCOUNTER — Inpatient Hospital Stay (HOSPITAL_COMMUNITY): Payer: Self-pay | Admitting: Certified Registered Nurse Anesthetist

## 2020-04-09 ENCOUNTER — Inpatient Hospital Stay (HOSPITAL_COMMUNITY): Payer: Self-pay

## 2020-04-09 ENCOUNTER — Other Ambulatory Visit: Payer: Self-pay

## 2020-04-09 ENCOUNTER — Inpatient Hospital Stay (HOSPITAL_COMMUNITY)
Admission: EM | Admit: 2020-04-09 | Discharge: 2020-04-16 | DRG: 957 | Disposition: A | Discharge status: Home / Self Care | Payer: Self-pay | Attending: Surgery | Admitting: Surgery

## 2020-04-09 ENCOUNTER — Emergency Department (HOSPITAL_COMMUNITY): Payer: Self-pay

## 2020-04-09 ENCOUNTER — Encounter (HOSPITAL_COMMUNITY): Admission: EM | Disposition: A | Discharge status: Home / Self Care | Payer: Self-pay | Source: Home / Self Care

## 2020-04-09 DIAGNOSIS — T148XXA Other injury of unspecified body region, initial encounter: Secondary | ICD-10-CM

## 2020-04-09 DIAGNOSIS — J9601 Acute respiratory failure with hypoxia: Secondary | ICD-10-CM | POA: Diagnosis present

## 2020-04-09 DIAGNOSIS — I82621 Acute embolism and thrombosis of deep veins of right upper extremity: Secondary | ICD-10-CM | POA: Diagnosis present

## 2020-04-09 DIAGNOSIS — S42309A Unspecified fracture of shaft of humerus, unspecified arm, initial encounter for closed fracture: Secondary | ICD-10-CM

## 2020-04-09 DIAGNOSIS — S21332A Puncture wound without foreign body of left front wall of thorax with penetration into thoracic cavity, initial encounter: Secondary | ICD-10-CM | POA: Diagnosis present

## 2020-04-09 DIAGNOSIS — D62 Acute posthemorrhagic anemia: Secondary | ICD-10-CM | POA: Diagnosis present

## 2020-04-09 DIAGNOSIS — S45901A Unspecified injury of unspecified blood vessel at shoulder and upper arm level, right arm, initial encounter: Secondary | ICD-10-CM | POA: Diagnosis present

## 2020-04-09 DIAGNOSIS — S21331A Puncture wound without foreign body of right front wall of thorax with penetration into thoracic cavity, initial encounter: Secondary | ICD-10-CM | POA: Diagnosis present

## 2020-04-09 DIAGNOSIS — Z20822 Contact with and (suspected) exposure to covid-19: Secondary | ICD-10-CM | POA: Diagnosis present

## 2020-04-09 DIAGNOSIS — S272XXA Traumatic hemopneumothorax, initial encounter: Secondary | ICD-10-CM | POA: Diagnosis present

## 2020-04-09 DIAGNOSIS — S27321A Contusion of lung, unilateral, initial encounter: Secondary | ICD-10-CM | POA: Diagnosis present

## 2020-04-09 DIAGNOSIS — T1490XA Injury, unspecified, initial encounter: Secondary | ICD-10-CM

## 2020-04-09 DIAGNOSIS — Z4682 Encounter for fitting and adjustment of non-vascular catheter: Secondary | ICD-10-CM

## 2020-04-09 DIAGNOSIS — S21339A Puncture wound without foreign body of unspecified front wall of thorax with penetration into thoracic cavity, initial encounter: Secondary | ICD-10-CM

## 2020-04-09 DIAGNOSIS — S45091A Other specified injury of axillary artery, right side, initial encounter: Secondary | ICD-10-CM

## 2020-04-09 DIAGNOSIS — R578 Other shock: Secondary | ICD-10-CM

## 2020-04-09 DIAGNOSIS — Z978 Presence of other specified devices: Secondary | ICD-10-CM

## 2020-04-09 DIAGNOSIS — W3400XA Accidental discharge from unspecified firearms or gun, initial encounter: Secondary | ICD-10-CM

## 2020-04-09 DIAGNOSIS — S42351B Displaced comminuted fracture of shaft of humerus, right arm, initial encounter for open fracture: Principal | ICD-10-CM | POA: Diagnosis present

## 2020-04-09 DIAGNOSIS — J9690 Respiratory failure, unspecified, unspecified whether with hypoxia or hypercapnia: Secondary | ICD-10-CM

## 2020-04-09 DIAGNOSIS — S41131A Puncture wound without foreign body of right upper arm, initial encounter: Secondary | ICD-10-CM | POA: Diagnosis present

## 2020-04-09 DIAGNOSIS — Z9289 Personal history of other medical treatment: Secondary | ICD-10-CM

## 2020-04-09 DIAGNOSIS — S21131A Puncture wound without foreign body of right front wall of thorax without penetration into thoracic cavity, initial encounter: Secondary | ICD-10-CM | POA: Diagnosis present

## 2020-04-09 DIAGNOSIS — K117 Disturbances of salivary secretion: Secondary | ICD-10-CM

## 2020-04-09 DIAGNOSIS — J942 Hemothorax: Secondary | ICD-10-CM

## 2020-04-09 DIAGNOSIS — S271XXA Traumatic hemothorax, initial encounter: Secondary | ICD-10-CM

## 2020-04-09 DIAGNOSIS — S2231XA Fracture of one rib, right side, initial encounter for closed fracture: Secondary | ICD-10-CM | POA: Diagnosis present

## 2020-04-09 HISTORY — PX: FEMORAL ARTERY EXPLORATION: SHX5160

## 2020-04-09 HISTORY — PX: EXTERNAL FIXATION ARM: SHX1552

## 2020-04-09 HISTORY — PX: INTRAOPERATIVE ARTERIOGRAM: SHX5157

## 2020-04-09 LAB — POCT I-STAT 7, (LYTES, BLD GAS, ICA,H+H)
Acid-Base Excess: 2 mmol/L (ref 0.0–2.0)
Acid-Base Excess: 0 mmol/L (ref 0.0–2.0)
Acid-Base Excess: 1 mmol/L (ref 0.0–2.0)
Acid-Base Excess: 1 mmol/L (ref 0.0–2.0)
Acid-base deficit: 1 mmol/L (ref 0.0–2.0)
Bicarbonate: 27.3 mmol/L (ref 20.0–28.0)
Bicarbonate: 26.2 mmol/L (ref 20.0–28.0)
Bicarbonate: 25.4 mmol/L (ref 20.0–28.0)
Bicarbonate: 26.6 mmol/L (ref 20.0–28.0)
Bicarbonate: 26.3 mmol/L (ref 20.0–28.0)
Calcium, Ion: 0.96 mmol/L — ABNORMAL LOW (ref 1.15–1.40)
Calcium, Ion: 0.99 mmol/L — ABNORMAL LOW (ref 1.15–1.40)
Calcium, Ion: 1.24 mmol/L (ref 1.15–1.40)
Calcium, Ion: 1.12 mmol/L — ABNORMAL LOW (ref 1.15–1.40)
Calcium, Ion: 1.18 mmol/L (ref 1.15–1.40)
HCT: 36 % — ABNORMAL LOW (ref 39.0–52.0)
HCT: 29 % — ABNORMAL LOW (ref 39.0–52.0)
HCT: 23 % — ABNORMAL LOW (ref 39.0–52.0)
HCT: 25 % — ABNORMAL LOW (ref 39.0–52.0)
HCT: 24 % — ABNORMAL LOW (ref 39.0–52.0)
Hemoglobin: 12.2 g/dL — ABNORMAL LOW (ref 13.0–17.0)
Hemoglobin: 9.9 g/dL — ABNORMAL LOW (ref 13.0–17.0)
Hemoglobin: 7.8 g/dL — ABNORMAL LOW (ref 13.0–17.0)
Hemoglobin: 8.5 g/dL — ABNORMAL LOW (ref 13.0–17.0)
Hemoglobin: 8.2 g/dL — ABNORMAL LOW (ref 13.0–17.0)
O2 Saturation: 24 %
O2 Saturation: 100 %
O2 Saturation: 89 %
O2 Saturation: 98 %
O2 Saturation: 99 %
Patient temperature: 35.5
Patient temperature: 34.9
Patient temperature: 34.4
Patient temperature: 34.4
Potassium: 6.4 mmol/L (ref 3.5–5.1)
Potassium: 3.2 mmol/L — ABNORMAL LOW (ref 3.5–5.1)
Potassium: 3.3 mmol/L — ABNORMAL LOW (ref 3.5–5.1)
Potassium: 3.3 mmol/L — ABNORMAL LOW (ref 3.5–5.1)
Potassium: 3.4 mmol/L — ABNORMAL LOW (ref 3.5–5.1)
Sodium: 142 mmol/L (ref 135–145)
Sodium: 146 mmol/L — ABNORMAL HIGH (ref 135–145)
Sodium: 145 mmol/L (ref 135–145)
Sodium: 146 mmol/L — ABNORMAL HIGH (ref 135–145)
Sodium: 145 mmol/L (ref 135–145)
TCO2: 29 mmol/L (ref 22–32)
TCO2: 27 mmol/L (ref 22–32)
TCO2: 27 mmol/L (ref 22–32)
TCO2: 28 mmol/L (ref 22–32)
TCO2: 28 mmol/L (ref 22–32)
pCO2 arterial: 61.1 mmHg — ABNORMAL HIGH (ref 32.0–48.0)
pCO2 arterial: 35.3 mmHg (ref 32.0–48.0)
pCO2 arterial: 38.7 mmHg (ref 32.0–48.0)
pCO2 arterial: 41.9 mmHg (ref 32.0–48.0)
pCO2 arterial: 41.3 mmHg (ref 32.0–48.0)
pH, Arterial: 7.258 — ABNORMAL LOW (ref 7.350–7.450)
pH, Arterial: 7.472 — ABNORMAL HIGH (ref 7.350–7.450)
pH, Arterial: 7.417 (ref 7.350–7.450)
pH, Arterial: 7.398 (ref 7.350–7.450)
pH, Arterial: 7.4 (ref 7.350–7.450)
pO2, Arterial: 20 mmHg — CL (ref 83.0–108.0)
pO2, Arterial: 250 mmHg — ABNORMAL HIGH (ref 83.0–108.0)
pO2, Arterial: 50 mmHg — ABNORMAL LOW (ref 83.0–108.0)
pO2, Arterial: 105 mmHg (ref 83.0–108.0)
pO2, Arterial: 124 mmHg — ABNORMAL HIGH (ref 83.0–108.0)

## 2020-04-09 LAB — COMPREHENSIVE METABOLIC PANEL WITH GFR
ALT: 7 U/L (ref 0–44)
AST: 16 U/L (ref 15–41)
Albumin: 2.7 g/dL — ABNORMAL LOW (ref 3.5–5.0)
Alkaline Phosphatase: 36 U/L — ABNORMAL LOW (ref 38–126)
Anion gap: 11 (ref 5–15)
BUN: 8 mg/dL (ref 6–20)
CO2: 17 mmol/L — ABNORMAL LOW (ref 22–32)
Calcium: 7.7 mg/dL — ABNORMAL LOW (ref 8.9–10.3)
Chloride: 109 mmol/L (ref 98–111)
Creatinine, Ser: 1.4 mg/dL — ABNORMAL HIGH (ref 0.61–1.24)
GFR calc Af Amer: >60 mL/min
GFR calc non Af Amer: >60 mL/min
Glucose, Bld: 223 mg/dL — ABNORMAL HIGH (ref 70–99)
Potassium: 3.5 mmol/L (ref 3.5–5.1)
Sodium: 137 mmol/L (ref 135–145)
Total Bilirubin: 0.9 mg/dL (ref 0.3–1.2)
Total Protein: 4.6 g/dL — ABNORMAL LOW (ref 6.5–8.1)

## 2020-04-09 LAB — I-STAT CHEM 8, ED
BUN: 8 mg/dL (ref 6–20)
Calcium, Ion: 0.87 mmol/L — CL (ref 1.15–1.40)
Chloride: 106 mmol/L (ref 98–111)
Creatinine, Ser: 1.2 mg/dL (ref 0.61–1.24)
Glucose, Bld: 218 mg/dL — ABNORMAL HIGH (ref 70–99)
HCT: 35 % — ABNORMAL LOW (ref 39.0–52.0)
Hemoglobin: 11.9 g/dL — ABNORMAL LOW (ref 13.0–17.0)
Potassium: 3.9 mmol/L (ref 3.5–5.1)
Sodium: 142 mmol/L (ref 135–145)
TCO2: 18 mmol/L — ABNORMAL LOW (ref 22–32)

## 2020-04-09 LAB — URINALYSIS, ROUTINE W REFLEX MICROSCOPIC
Bilirubin Urine: NEGATIVE
Glucose, UA: NEGATIVE mg/dL
Hgb urine dipstick: NEGATIVE
Ketones, ur: NEGATIVE mg/dL
Leukocytes,Ua: NEGATIVE
Nitrite: NEGATIVE
Protein, ur: NEGATIVE mg/dL
Specific Gravity, Urine: 1.044 — ABNORMAL HIGH (ref 1.005–1.030)
pH: 6 (ref 5.0–8.0)

## 2020-04-09 LAB — CBC
HCT: 40.2 % (ref 39.0–52.0)
Hemoglobin: 13.1 g/dL (ref 13.0–17.0)
MCH: 30.3 pg (ref 26.0–34.0)
MCHC: 32.6 g/dL (ref 30.0–36.0)
MCV: 92.8 fL (ref 80.0–100.0)
Platelets: 84 10*3/uL — ABNORMAL LOW (ref 150–400)
RBC: 4.33 MIL/uL (ref 4.22–5.81)
RDW: 13.9 % (ref 11.5–15.5)
WBC: 19.1 10*3/uL — ABNORMAL HIGH (ref 4.0–10.5)
nRBC: 0 % (ref 0.0–0.2)

## 2020-04-09 LAB — LACTIC ACID, PLASMA: Lactic Acid, Venous: 3.9 mmol/L (ref 0.5–1.9)

## 2020-04-09 LAB — SARS CORONAVIRUS 2 BY RT PCR (HOSPITAL ORDER, PERFORMED IN ~~LOC~~ HOSPITAL LAB): SARS Coronavirus 2: NEGATIVE

## 2020-04-09 LAB — PREPARE RBC (CROSSMATCH)

## 2020-04-09 LAB — ETHANOL: Alcohol, Ethyl (B): <10 mg/dL

## 2020-04-09 LAB — ABO/RH: ABO/RH(D): O POS

## 2020-04-09 SURGERY — INTRA OPERATIVE ARTERIOGRAM
Anesthesia: General | Site: Chest | Laterality: Right

## 2020-04-09 MED ORDER — VECURONIUM BROMIDE 10 MG IV SOLR: 10.0000 mg | Freq: Once | INTRAVENOUS | Status: AC

## 2020-04-09 MED ORDER — ACETAMINOPHEN 160 MG/5ML PO SOLN
650.0000 mg | Freq: Four times a day (QID) | ORAL | Status: DC | PRN
  Administered 2020-04-10 – 2020-04-12 (×4): 650 mg
  Filled 2020-04-09 (×4): qty 20.3

## 2020-04-09 MED ORDER — IODIXANOL 320 MG/ML IV SOLN
INTRAVENOUS | Status: DC | PRN
  Administered 2020-04-09: 150 mL

## 2020-04-09 MED ORDER — ROCURONIUM BROMIDE 10 MG/ML (PF) SYRINGE
PREFILLED_SYRINGE | INTRAVENOUS | Status: AC
  Filled 2020-04-09: qty 40

## 2020-04-09 MED ORDER — VECURONIUM BROMIDE 10 MG IV SOLR
INTRAVENOUS | Status: AC | PRN
  Administered 2020-04-09: 10 mg via INTRAVENOUS

## 2020-04-09 MED ORDER — ONDANSETRON HCL 4 MG/2ML IJ SOLN: 4.0000 mg | Freq: Four times a day (QID) | INTRAMUSCULAR | Status: DC | PRN

## 2020-04-09 MED ORDER — DOCUSATE SODIUM 50 MG/5ML PO LIQD
100.0000 mg | Freq: Two times a day (BID) | ORAL | Status: DC
  Filled 2020-04-09: qty 10

## 2020-04-09 MED ORDER — MIDAZOLAM HCL 2 MG/2ML IJ SOLN
INTRAMUSCULAR | Status: AC
  Filled 2020-04-09: qty 2

## 2020-04-09 MED ORDER — ACETAMINOPHEN 325 MG PO TABS: 650.0000 mg | ORAL_TABLET | ORAL | Status: DC | PRN

## 2020-04-09 MED ORDER — CEFAZOLIN SODIUM-DEXTROSE 2-3 GM-%(50ML) IV SOLR
INTRAVENOUS | Status: DC | PRN
  Administered 2020-04-09: 2 g via INTRAVENOUS

## 2020-04-09 MED ORDER — ROCURONIUM BROMIDE 100 MG/10ML IV SOLN
INTRAVENOUS | Status: DC | PRN
  Administered 2020-04-09: 20 mg via INTRAVENOUS
  Administered 2020-04-09 (×2): 50 mg via INTRAVENOUS

## 2020-04-09 MED ORDER — FENTANYL BOLUS VIA INFUSION
50.0000 ug | INTRAVENOUS | Status: DC | PRN
  Administered 2020-04-14: 50 ug via INTRAVENOUS
  Filled 2020-04-09: qty 50

## 2020-04-09 MED ORDER — PHENYLEPHRINE HCL-NACL 10-0.9 MG/250ML-% IV SOLN
25.0000 ug/min | INTRAVENOUS | Status: DC
  Administered 2020-04-09: 25 ug/min via INTRAVENOUS

## 2020-04-09 MED ORDER — FENTANYL CITRATE (PF) 250 MCG/5ML IJ SOLN
INTRAMUSCULAR | Status: DC | PRN
Start: 2020-04-09 — End: 2020-04-10
  Administered 2020-04-09: 50 ug via INTRAVENOUS
  Administered 2020-04-09: 150 ug via INTRAVENOUS
  Administered 2020-04-09: 50 ug via INTRAVENOUS

## 2020-04-09 MED ORDER — MIDAZOLAM HCL 5 MG/5ML IJ SOLN
INTRAMUSCULAR | Status: DC | PRN
  Administered 2020-04-09: 2 mg via INTRAVENOUS

## 2020-04-09 MED ORDER — FENTANYL 2500MCG IN NS 250ML (10MCG/ML) PREMIX INFUSION
25.0000 ug/h | INTRAVENOUS | Status: DC
  Administered 2020-04-09: 100 ug/h via INTRAVENOUS
  Administered 2020-04-10: 150 ug/h via INTRAVENOUS
  Filled 2020-04-09: qty 250

## 2020-04-09 MED ORDER — PROPOFOL 500 MG/50ML IV EMUL
INTRAVENOUS | Status: DC | PRN
  Administered 2020-04-09: 75 ug/kg/min via INTRAVENOUS

## 2020-04-09 MED ORDER — EPINEPHRINE 0.1 MG/10ML (10 MCG/ML) SYRINGE FOR IV PUSH (FOR BLOOD PRESSURE SUPPORT)
PREFILLED_SYRINGE | INTRAVENOUS | Status: AC
  Filled 2020-04-09: qty 30

## 2020-04-09 MED ORDER — SODIUM CHLORIDE 0.9 % IV SOLN: INTRAVENOUS | Status: DC | PRN

## 2020-04-09 MED ORDER — SODIUM CHLORIDE 0.9 % IV SOLN: INTRAVENOUS | Status: AC

## 2020-04-09 MED ORDER — LACTATED RINGERS IV SOLN: INTRAVENOUS | Status: DC | PRN

## 2020-04-09 MED ORDER — PHENYLEPHRINE HCL (PRESSORS) 10 MG/ML IV SOLN
INTRAVENOUS | Status: DC | PRN
  Administered 2020-04-09: 80 ug via INTRAVENOUS

## 2020-04-09 MED ORDER — ALBUMIN HUMAN 5 % IV SOLN: INTRAVENOUS | Status: DC | PRN

## 2020-04-09 MED ORDER — SODIUM CHLORIDE 0.9 % IV SOLN
250.0000 mL | INTRAVENOUS | Status: DC
  Administered 2020-04-11 (×2): 250 mL via INTRAVENOUS

## 2020-04-09 MED ORDER — LABETALOL HCL 5 MG/ML IV SOLN
10.0000 mg | INTRAVENOUS | Status: AC | PRN
  Administered 2020-04-12 – 2020-04-14 (×4): 10 mg via INTRAVENOUS
  Filled 2020-04-09 (×4): qty 4

## 2020-04-09 MED ORDER — MIDAZOLAM HCL 2 MG/2ML IJ SOLN
2.0000 mg | INTRAMUSCULAR | Status: DC | PRN
  Administered 2020-04-11: 2 mg via INTRAVENOUS
  Filled 2020-04-09 (×2): qty 2

## 2020-04-09 MED ORDER — VECURONIUM BROMIDE 10 MG IV SOLR
INTRAVENOUS | Status: AC
  Administered 2020-04-09: 10 mg via INTRAVENOUS
  Filled 2020-04-09: qty 10

## 2020-04-09 MED ORDER — PHENYLEPHRINE 40 MCG/ML (10ML) SYRINGE FOR IV PUSH (FOR BLOOD PRESSURE SUPPORT)
PREFILLED_SYRINGE | INTRAVENOUS | Status: AC
  Filled 2020-04-09: qty 30

## 2020-04-09 MED ORDER — SODIUM CHLORIDE 0.9 % IV SOLN: 250.0000 mL | INTRAVENOUS | Status: DC | PRN

## 2020-04-09 MED ORDER — 0.9 % SODIUM CHLORIDE (POUR BTL) OPTIME
TOPICAL | Status: DC | PRN
  Administered 2020-04-09: 2000 mL

## 2020-04-09 MED ORDER — PROTAMINE SULFATE 10 MG/ML IV SOLN
INTRAVENOUS | Status: DC | PRN
  Administered 2020-04-09: 20 mg via INTRAVENOUS
  Administered 2020-04-09 (×2): 10 mg via INTRAVENOUS
  Administered 2020-04-09 (×2): 30 mg via INTRAVENOUS

## 2020-04-09 MED ORDER — ETOMIDATE 2 MG/ML IV SOLN
INTRAVENOUS | Status: AC | PRN
  Administered 2020-04-09: 5 mg via INTRAVENOUS

## 2020-04-09 MED ORDER — CHLORHEXIDINE GLUCONATE CLOTH 2 % EX PADS: 6.0000 | MEDICATED_PAD | Freq: Every day | CUTANEOUS | Status: DC

## 2020-04-09 MED ORDER — MIDAZOLAM HCL 2 MG/2ML IJ SOLN
2.0000 mg | INTRAMUSCULAR | Status: AC | PRN
  Administered 2020-04-11 (×3): 2 mg via INTRAVENOUS
  Filled 2020-04-09 (×2): qty 2

## 2020-04-09 MED ORDER — ROCURONIUM BROMIDE 50 MG/5ML IV SOLN
INTRAVENOUS | Status: AC | PRN
  Administered 2020-04-09: 80 mg via INTRAVENOUS

## 2020-04-09 MED ORDER — HEMOSTATIC AGENTS (NO CHARGE) OPTIME
TOPICAL | Status: DC | PRN
  Administered 2020-04-09: 1 via TOPICAL

## 2020-04-09 MED ORDER — FENTANYL CITRATE (PF) 100 MCG/2ML IJ SOLN
INTRAMUSCULAR | Status: AC
  Administered 2020-04-09: 25 ug
  Filled 2020-04-09: qty 2

## 2020-04-09 MED ORDER — PANTOPRAZOLE SODIUM 40 MG PO TBEC
40.0000 mg | DELAYED_RELEASE_TABLET | Freq: Every day | ORAL | Status: DC
  Administered 2020-04-12: 40 mg via ORAL
  Filled 2020-04-09: qty 1

## 2020-04-09 MED ORDER — HYDRALAZINE HCL 20 MG/ML IJ SOLN
5.0000 mg | INTRAMUSCULAR | Status: AC | PRN
  Administered 2020-04-12 – 2020-04-13 (×2): 5 mg via INTRAVENOUS
  Filled 2020-04-09 (×2): qty 1

## 2020-04-09 MED ORDER — SODIUM CHLORIDE 0.9% IV SOLUTION: Freq: Once | INTRAVENOUS | Status: DC

## 2020-04-09 MED ORDER — PHENYLEPHRINE 40 MCG/ML (10ML) SYRINGE FOR IV PUSH (FOR BLOOD PRESSURE SUPPORT)
PREFILLED_SYRINGE | INTRAVENOUS | Status: AC
  Filled 2020-04-09: qty 10

## 2020-04-09 MED ORDER — FENTANYL CITRATE (PF) 100 MCG/2ML IJ SOLN: 50.0000 ug | Freq: Once | INTRAMUSCULAR | Status: DC

## 2020-04-09 MED ORDER — PROPOFOL 1000 MG/100ML IV EMUL
0.0000 ug/kg/min | INTRAVENOUS | Status: DC
  Administered 2020-04-10: 25 ug/kg/min via INTRAVENOUS
  Administered 2020-04-10: 45 ug/kg/min via INTRAVENOUS
  Administered 2020-04-10: 30 ug/kg/min via INTRAVENOUS
  Administered 2020-04-10: 45 ug/kg/min via INTRAVENOUS
  Administered 2020-04-11 – 2020-04-12 (×5): 50 ug/kg/min via INTRAVENOUS
  Administered 2020-04-12: 35 ug/kg/min via INTRAVENOUS
  Administered 2020-04-12: 25 ug/kg/min via INTRAVENOUS
  Administered 2020-04-12: 50 ug/kg/min via INTRAVENOUS
  Administered 2020-04-12: 35 ug/kg/min via INTRAVENOUS
  Administered 2020-04-13: 40 ug/kg/min via INTRAVENOUS
  Administered 2020-04-13: 35 ug/kg/min via INTRAVENOUS
  Administered 2020-04-13 – 2020-04-14 (×4): 50 ug/kg/min via INTRAVENOUS
  Filled 2020-04-09: qty 200
  Filled 2020-04-09 (×18): qty 100

## 2020-04-09 MED ORDER — PANTOPRAZOLE SODIUM 40 MG IV SOLR
40.0000 mg | Freq: Every day | INTRAVENOUS | Status: DC
  Administered 2020-04-10 – 2020-04-13 (×3): 40 mg via INTRAVENOUS
  Filled 2020-04-09 (×3): qty 40

## 2020-04-09 MED ORDER — ARTIFICIAL TEARS OPHTHALMIC OINT
TOPICAL_OINTMENT | OPHTHALMIC | Status: DC | PRN
  Administered 2020-04-09: 1 via OPHTHALMIC

## 2020-04-09 MED ORDER — SODIUM CHLORIDE 0.9% FLUSH
3.0000 mL | Freq: Two times a day (BID) | INTRAVENOUS | Status: DC
  Administered 2020-04-10 – 2020-04-15 (×9): 3 mL via INTRAVENOUS

## 2020-04-09 MED ORDER — POLYETHYLENE GLYCOL 3350 17 G PO PACK: 17.0000 g | PACK | Freq: Every day | ORAL | Status: DC

## 2020-04-09 MED ORDER — ORAL CARE MOUTH RINSE
15.0000 mL | OROMUCOSAL | Status: DC
  Administered 2020-04-09 – 2020-04-14 (×45): 15 mL via OROMUCOSAL

## 2020-04-09 MED ORDER — LACTATED RINGERS IV SOLN: INTRAVENOUS | Status: DC

## 2020-04-09 MED ORDER — HEPARIN SODIUM (PORCINE) 1000 UNIT/ML IJ SOLN
INTRAMUSCULAR | Status: DC | PRN
  Administered 2020-04-09: 5000 [IU] via INTRAVENOUS
  Administered 2020-04-09: 7000 [IU] via INTRAVENOUS

## 2020-04-09 MED ORDER — SODIUM CHLORIDE 0.9 % IV SOLN
INTRAVENOUS | Status: AC
  Filled 2020-04-09: qty 1.2

## 2020-04-09 MED ORDER — IOHEXOL 350 MG/ML SOLN
100.0000 mL | Freq: Once | INTRAVENOUS | Status: AC | PRN
  Administered 2020-04-09: 100 mL via INTRAVENOUS

## 2020-04-09 MED ORDER — FENTANYL BOLUS VIA INFUSION
50.0000 ug | INTRAVENOUS | Status: DC | PRN
  Administered 2020-04-13 – 2020-04-14 (×3): 50 ug via INTRAVENOUS
  Filled 2020-04-09: qty 50

## 2020-04-09 MED ORDER — FENTANYL CITRATE (PF) 250 MCG/5ML IJ SOLN
INTRAMUSCULAR | Status: AC
  Filled 2020-04-09: qty 5

## 2020-04-09 MED ORDER — CALCIUM CHLORIDE 10 % IV SOLN
INTRAVENOUS | Status: DC | PRN
  Administered 2020-04-09: 200 mg via INTRAVENOUS
  Administered 2020-04-09: 800 mg via INTRAVENOUS

## 2020-04-09 MED ORDER — PHENYLEPHRINE HCL-NACL 10-0.9 MG/250ML-% IV SOLN
INTRAVENOUS | Status: DC | PRN
  Administered 2020-04-09: 25 ug/min via INTRAVENOUS

## 2020-04-09 MED ORDER — SODIUM CHLORIDE 0.9% FLUSH: 3.0000 mL | INTRAVENOUS | Status: DC | PRN

## 2020-04-09 MED ORDER — VECURONIUM BROMIDE 10 MG IV SOLR
INTRAVENOUS | Status: DC | PRN
  Administered 2020-04-09: 10 mg via INTRAVENOUS

## 2020-04-09 MED ORDER — FENTANYL 2500MCG IN NS 250ML (10MCG/ML) PREMIX INFUSION
50.0000 ug/h | INTRAVENOUS | Status: DC
  Administered 2020-04-11 – 2020-04-12 (×4): 200 ug/h via INTRAVENOUS
  Administered 2020-04-12 – 2020-04-13 (×2): 150 ug/h via INTRAVENOUS
  Administered 2020-04-14: 200 ug/h via INTRAVENOUS
  Filled 2020-04-09 (×8): qty 250

## 2020-04-09 MED ORDER — CHLORHEXIDINE GLUCONATE 0.12% ORAL RINSE (MEDLINE KIT)
15.0000 mL | Freq: Two times a day (BID) | OROMUCOSAL | Status: DC
  Administered 2020-04-10 – 2020-04-14 (×8): 15 mL via OROMUCOSAL

## 2020-04-09 MED ORDER — ALBUTEROL SULFATE HFA 108 (90 BASE) MCG/ACT IN AERS
INHALATION_SPRAY | RESPIRATORY_TRACT | Status: DC | PRN
  Administered 2020-04-09: 4 via RESPIRATORY_TRACT

## 2020-04-09 SURGICAL SUPPLY — 79 items
BAG BANDED W/RUBBER/TAPE 36X54 (MISCELLANEOUS) ×4 IMPLANT
BAR GLASS FIBER EXFX 11X300 (EXFIX) ×4 IMPLANT
BIOPATCH RED 1 DISK 7.0 (GAUZE/BANDAGES/DRESSINGS) ×4 IMPLANT
BNDG GAUZE ELAST 4 BULKY (GAUZE/BANDAGES/DRESSINGS) ×4 IMPLANT
CANISTER SUCT 3000ML PPV (MISCELLANEOUS) ×8 IMPLANT
CATH ACCU-VU SIZ PIG 5F 100CM (CATHETERS) ×4 IMPLANT
CATH ANGIO 5F BER2 100CM (CATHETERS) ×4 IMPLANT
CATH BEACON 5 .035 65 KMP TIP (CATHETERS) ×8 IMPLANT
CATH EMB 3FR 80CM (CATHETERS) ×8 IMPLANT
CLAMP BLUE BAR TO PIN (EXFIX) ×8 IMPLANT
CLIP VESOCCLUDE MED 24/CT (CLIP) ×4 IMPLANT
CLIP VESOCCLUDE SM WIDE 24/CT (CLIP) ×4 IMPLANT
COVER DOME SNAP 22 D (MISCELLANEOUS) ×4 IMPLANT
COVER MAYO STAND STRL (DRAPES) ×4 IMPLANT
COVER PROBE W GEL 5X96 (DRAPES) ×4 IMPLANT
COVER WAND RF STERILE (DRAPES) IMPLANT
DERMABOND ADVANCED (GAUZE/BANDAGES/DRESSINGS) ×1
DERMABOND ADVANCED .7 DNX12 (GAUZE/BANDAGES/DRESSINGS) ×3 IMPLANT
DRAIN SNY 10X20 3/4 PERF (WOUND CARE) IMPLANT
DRAPE HALF SHEET 40X57 (DRAPES) ×8 IMPLANT
DRAPE INCISE IOBAN 66X45 STRL (DRAPES) ×4 IMPLANT
DRAPE ORTHO SPLIT 87X125 STRL (DRAPES) ×4 IMPLANT
DRSG ADAPTIC 3X8 NADH LF (GAUZE/BANDAGES/DRESSINGS) ×8 IMPLANT
ELECT BLADE 4.0 EZ CLEAN MEGAD (MISCELLANEOUS) ×4
ELECT PENCIL ROCKER SW 15FT (MISCELLANEOUS) ×4 IMPLANT
ELECT REM PT RETURN 9FT ADLT (ELECTROSURGICAL) ×4
ELECTRODE BLDE 4.0 EZ CLN MEGD (MISCELLANEOUS) ×3 IMPLANT
ELECTRODE REM PT RTRN 9FT ADLT (ELECTROSURGICAL) ×3 IMPLANT
EVACUATOR SILICONE 100CC (DRAIN) IMPLANT
GAUZE 4X4 16PLY RFD (DISPOSABLE) ×4 IMPLANT
GAUZE SPONGE 4X4 12PLY STRL (GAUZE/BANDAGES/DRESSINGS) ×4 IMPLANT
GAUZE SPONGE 4X4 12PLY STRL LF (GAUZE/BANDAGES/DRESSINGS) ×4 IMPLANT
GAUZE SPONGE 4X4 16PLY XRAY LF (GAUZE/BANDAGES/DRESSINGS) ×8 IMPLANT
GLOVE BIO SURGEON STRL SZ7.5 (GLOVE) ×24 IMPLANT
GLOVE BIOGEL PI IND STRL 6.5 (GLOVE) ×21 IMPLANT
GLOVE BIOGEL PI IND STRL 8.5 (GLOVE) ×3 IMPLANT
GLOVE BIOGEL PI INDICATOR 6.5 (GLOVE) ×7
GLOVE BIOGEL PI INDICATOR 8.5 (GLOVE) ×1
GLOVE SURG ORTHO 8.5 STRL (GLOVE) ×8 IMPLANT
GLOVE SURG SS PI 6.5 STRL IVOR (GLOVE) ×4 IMPLANT
GOWN STRL REUS W/ TWL LRG LVL3 (GOWN DISPOSABLE) ×24 IMPLANT
GOWN STRL REUS W/TWL 2XL LVL3 (GOWN DISPOSABLE) ×4 IMPLANT
GOWN STRL REUS W/TWL LRG LVL3 (GOWN DISPOSABLE) ×8
HEMOSTAT SPONGE AVITENE ULTRA (HEMOSTASIS) ×4 IMPLANT
KIT BASIN OR (CUSTOM PROCEDURE TRAY) ×4 IMPLANT
KIT TURNOVER KIT B (KITS) ×4 IMPLANT
NEEDLE PERC 18GX7CM (NEEDLE) ×4 IMPLANT
NS IRRIG 1000ML POUR BTL (IV SOLUTION) ×8 IMPLANT
PACK PERIPHERAL VASCULAR (CUSTOM PROCEDURE TRAY) ×4 IMPLANT
PAD ARMBOARD 7.5X6 YLW CONV (MISCELLANEOUS) ×8 IMPLANT
PENCIL BUTTON HOLSTER BLD 10FT (ELECTRODE) ×4 IMPLANT
PIN HALF YELLOW 5X160X35 (EXFIX) ×8 IMPLANT
SHEATH PINNACLE 5F 10CM (SHEATH) ×4 IMPLANT
STAPLER VISISTAT 35W (STAPLE) ×8 IMPLANT
STOPCOCK MORSE 400PSI 3WAY (MISCELLANEOUS) ×4 IMPLANT
SUT PROLENE 5 0 C 1 24 (SUTURE) ×8 IMPLANT
SUT PROLENE 6 0 CC (SUTURE) ×12 IMPLANT
SUT SILK 2 0 PERMA HAND 18 BK (SUTURE) ×4 IMPLANT
SUT SILK 2 0 SH (SUTURE) ×4 IMPLANT
SUT VIC AB 2-0 SH 27 (SUTURE) ×2
SUT VIC AB 2-0 SH 27XBRD (SUTURE) ×6 IMPLANT
SUT VIC AB 3-0 SH 27 (SUTURE) ×4
SUT VIC AB 3-0 SH 27X BRD (SUTURE) ×12 IMPLANT
SUT VIC AB 4-0 PS2 18 (SUTURE) ×8 IMPLANT
SUT VICRYL 4-0 PS2 18IN ABS (SUTURE) IMPLANT
SYR 10ML LL (SYRINGE) ×16 IMPLANT
SYR 20CC LL (SYRINGE) ×8 IMPLANT
SYR 30ML LL (SYRINGE) ×4 IMPLANT
SYR 3ML LL SCALE MARK (SYRINGE) ×4 IMPLANT
SYR BULB IRRIG 60ML STRL (SYRINGE) ×4 IMPLANT
SYR TB 1ML LUER SLIP (SYRINGE) ×4 IMPLANT
TAPE CLOTH SURG 4X10 WHT LF (GAUZE/BANDAGES/DRESSINGS) ×4 IMPLANT
TAPE STRIPS DRAPE STRL (GAUZE/BANDAGES/DRESSINGS) ×4 IMPLANT
TOWEL GREEN STERILE (TOWEL DISPOSABLE) ×8 IMPLANT
TRAY FOLEY MTR SLVR 16FR STAT (SET/KITS/TRAYS/PACK) IMPLANT
TUBE CONNECTING 20X1/4 (TUBING) ×4 IMPLANT
TUBING INJECTOR 48 (MISCELLANEOUS) ×4 IMPLANT
WATER STERILE IRR 1000ML POUR (IV SOLUTION) ×8 IMPLANT
WIRE BENTSON .035X145CM (WIRE) ×4 IMPLANT

## 2020-04-09 NOTE — Procedures (Signed)
Insertion of Chest Tube Procedure Note  Teal Bontrager  [id number]  [birth year]  Date:04/09/20  Time:4:09 PM    Provider Performing: Liz Malady   Procedure: R chest tube 20fr  Indication(s) R hemothorax  Consent emergency  Anesthesia none   Time Out Verified patient identification, verified procedure, site/side was marked, verified correct patient position, special equipment/implants available, medications/allergies/relevant history reviewed, required imaging and test results available.   Sterile Technique Maximal sterile technique including full sterile barrier drape, hand hygiene, sterile gown, sterile gloves, mask, hair covering, sterile ultrasound probe cover (if used).   Procedure Description Ultrasound not used to identify appropriate pleural anatomy for placement and overlying skin marked. Area of placement cleaned and draped in sterile fashion.  A 14 French pigtail pleural catheter was placed into the right pleural space using Seldinger technique. Appropriate return of blood was obtained.  The tube was connected to atrium and placed on -20 cm H2O wall suction.   Complications/Tolerance None; patient tolerated the procedure well. Chest X-ray is ordered to verify placement.   EBL 600 out initially  Specimen(s) none   Violeta Gelinas, MD, MPH, FACS Please use [email or web address] to contact on call provider
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
## 2020-04-09 NOTE — ED Notes (Signed)
Critical CHEM8 results read back to Dr. Pilar Plate
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
## 2020-04-09 NOTE — Discharge Planning (Signed)
RNCM following for disposition needs. °Kastin Cerda J. Legrande Hao, RN, BSN, NCM [phone] ° °
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
## 2020-04-09 NOTE — ED Notes (Signed)
12 fr NG tube left nostril. Placement confirmed with ascultation.

## 2020-04-09 NOTE — Transfer of Care (Addendum)
Immediate Anesthesia Transfer of Care Note  Patient: Daniel [name]  Procedure(s) Performed: Right upper Extrimity INTRA OPERATIVE ARTERIOGRAM, Arch Aortogram, Second Order Catherization right Subclavian Artery. (Right Chest) AXILLA  ARTERY EXPLORATION, Repair of right Axillary Artery with reverse Left greater Saphenous Vein.   Ligation of Right Axillary Vein. (Right Arm Upper) EXTERNAL FIXATION ARM (Right Arm Upper)  Patient Location: ICU  Anesthesia Type:General  Level of Consciousness: Patient remains intubated per anesthesia plan  Airway & Oxygen Therapy: Patient remains intubated per anesthesia plan and Patient placed on Ventilator (see vital sign flow sheet for setting)  Post-op Assessment: Report given to RN and Post -op Vital signs reviewed and stable  Post vital signs: Reviewed and stable  Last Vitals:  Vitals Value Taken Time  BP 157/82 (ABP) 04/09/20 2240  Temp 34.5 C 04/09/20 2248  Pulse 73 04/09/20 2248  Resp 22 04/09/20 2248  SpO2 100 % 04/09/20 2248  Vitals shown include unvalidated device data.  Last Pain:  Vitals:   04/09/20 1705  TempSrc:   PainSc: 4      Report to Tammy RN, transported via this CRNA and Hodierne MD, RT at bedside, applied to ventilator, 550 Vt, RR 22, PEEP 5, 100% FiO2, breath sounds assessed upon application to ventilator, course bilteral rhonchi noted, full report given, transfer of care in safe and stable condition, patient maintained on fentanyl and propofol gtts.    Complications: No complications documented.
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
## 2020-04-09 NOTE — Anesthesia Preprocedure Evaluation (Signed)
Anesthesia Evaluation  Patient identified by MRN, date of birth, ID band Patient unresponsive  Preop documentation limited or incomplete due to emergent nature of procedure.  History of Anesthesia Complications (+) DIFFICULT AIRWAY  Airway Mallampati: Intubated       Dental   Pulmonary     + decreased breath sounds      Cardiovascular  Rhythm:Regular Rate:Tachycardia     Neuro/Psych    GI/Hepatic   Endo/Other    Renal/GU      Musculoskeletal   Abdominal   Peds  Hematology   Anesthesia Other Findings   Reproductive/Obstetrics                             Anesthesia Physical Anesthesia Plan  ASA: III and emergent  Anesthesia Plan: General   Post-op Pain Management:    Induction: Intravenous  PONV Risk Score and Plan: 2 and Ondansetron and Dexamethasone  Airway Management Planned: Oral ETT  Additional Equipment:   Intra-op Plan:   Post-operative Plan: Post-operative intubation/ventilation  Informed Consent:     Dental advisory given  Plan Discussed with: CRNA, Anesthesiologist and Surgeon  Anesthesia Plan Comments:         Anesthesia Quick Evaluation
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
## 2020-04-10 ENCOUNTER — Inpatient Hospital Stay (HOSPITAL_COMMUNITY): Payer: Self-pay

## 2020-04-10 LAB — BASIC METABOLIC PANEL WITH GFR
Anion gap: 6 (ref 5–15)
BUN: 9 mg/dL (ref 6–20)
CO2: 25 mmol/L (ref 22–32)
Calcium: 7.9 mg/dL — ABNORMAL LOW (ref 8.9–10.3)
Chloride: 109 mmol/L (ref 98–111)
Creatinine, Ser: 0.8 mg/dL (ref 0.61–1.24)
GFR calc Af Amer: >60 mL/min
GFR calc non Af Amer: >60 mL/min
Glucose, Bld: 94 mg/dL (ref 70–99)
Potassium: 3.4 mmol/L — ABNORMAL LOW (ref 3.5–5.1)
Sodium: 140 mmol/L (ref 135–145)

## 2020-04-10 LAB — CBC
HCT: 27.7 % — ABNORMAL LOW (ref 39.0–52.0)
HCT: 27.4 % — ABNORMAL LOW (ref 39.0–52.0)
Hemoglobin: 9.7 g/dL — ABNORMAL LOW (ref 13.0–17.0)
Hemoglobin: 9.4 g/dL — ABNORMAL LOW (ref 13.0–17.0)
MCH: 31 pg (ref 26.0–34.0)
MCH: 30 pg (ref 26.0–34.0)
MCHC: 35 g/dL (ref 30.0–36.0)
MCHC: 34.3 g/dL (ref 30.0–36.0)
MCV: 88.5 fL (ref 80.0–100.0)
MCV: 87.5 fL (ref 80.0–100.0)
Platelets: 96 10*3/uL — ABNORMAL LOW (ref 150–400)
Platelets: 99 10*3/uL — ABNORMAL LOW (ref 150–400)
RBC: 3.13 MIL/uL — ABNORMAL LOW (ref 4.22–5.81)
RBC: 3.13 MIL/uL — ABNORMAL LOW (ref 4.22–5.81)
RDW: 14.2 % (ref 11.5–15.5)
RDW: 14.1 % (ref 11.5–15.5)
WBC: 11.7 10*3/uL — ABNORMAL HIGH (ref 4.0–10.5)
WBC: 9.1 10*3/uL (ref 4.0–10.5)
nRBC: 0 % (ref 0.0–0.2)
nRBC: 0 % (ref 0.0–0.2)

## 2020-04-10 LAB — PREPARE PLATELET PHERESIS: Unit division: 0

## 2020-04-10 LAB — APTT: aPTT: 37 seconds — ABNORMAL HIGH (ref 24–36)

## 2020-04-10 LAB — TRIGLYCERIDES: Triglycerides: 100 mg/dL

## 2020-04-10 LAB — BPAM PLATELET PHERESIS
Blood Product Expiration Date: 202109052359
ISSUE DATE / TIME: 202109031634
Unit Type and Rh: 6200

## 2020-04-10 LAB — HIV ANTIBODY (ROUTINE TESTING W REFLEX): HIV Screen 4th Generation wRfx: NONREACTIVE

## 2020-04-10 LAB — PROTIME-INR
INR: 1.5 — ABNORMAL HIGH (ref 0.8–1.2)
INR: 1.3 — ABNORMAL HIGH (ref 0.8–1.2)
Prothrombin Time: 17.8 seconds — ABNORMAL HIGH (ref 11.4–15.2)
Prothrombin Time: 15.8 seconds — ABNORMAL HIGH (ref 11.4–15.2)

## 2020-04-10 LAB — MRSA PCR SCREENING: MRSA by PCR: POSITIVE — AB

## 2020-04-10 MED ORDER — DOCUSATE SODIUM 50 MG/5ML PO LIQD
100.0000 mg | Freq: Two times a day (BID) | ORAL | Status: DC
  Administered 2020-04-10 – 2020-04-13 (×8): 100 mg
  Filled 2020-04-10 (×7): qty 10

## 2020-04-10 MED ORDER — MUPIROCIN 2 % EX OINT
1.0000 "application " | TOPICAL_OINTMENT | Freq: Two times a day (BID) | CUTANEOUS | Status: AC
  Administered 2020-04-10 – 2020-04-14 (×10): 1 via NASAL
  Filled 2020-04-10: qty 22

## 2020-04-10 MED ORDER — CHLORHEXIDINE GLUCONATE CLOTH 2 % EX PADS
6.0000 | MEDICATED_PAD | Freq: Every day | CUTANEOUS | Status: AC
  Administered 2020-04-10 – 2020-04-14 (×5): 6 via TOPICAL

## 2020-04-10 NOTE — Progress Notes (Signed)
Patient biting down on EET. Bite block placed on Right side. Patient tolerated well. RN at bedside.
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
## 2020-04-11 ENCOUNTER — Inpatient Hospital Stay (HOSPITAL_COMMUNITY): Payer: Self-pay

## 2020-04-11 LAB — CBC
HCT: 23 % — ABNORMAL LOW (ref 39.0–52.0)
Hemoglobin: 7.6 g/dL — ABNORMAL LOW (ref 13.0–17.0)
MCH: 29.7 pg (ref 26.0–34.0)
MCHC: 33 g/dL (ref 30.0–36.0)
MCV: 89.8 fL (ref 80.0–100.0)
Platelets: 92 10*3/uL — ABNORMAL LOW (ref 150–400)
RBC: 2.56 MIL/uL — ABNORMAL LOW (ref 4.22–5.81)
RDW: 14 % (ref 11.5–15.5)
WBC: 11 10*3/uL — ABNORMAL HIGH (ref 4.0–10.5)
nRBC: 0 % (ref 0.0–0.2)

## 2020-04-11 LAB — BASIC METABOLIC PANEL
Anion gap: 9 (ref 5–15)
BUN: 7 mg/dL (ref 6–20)
CO2: 24 mmol/L (ref 22–32)
Calcium: 7.5 mg/dL — ABNORMAL LOW (ref 8.9–10.3)
Chloride: 106 mmol/L (ref 98–111)
Creatinine, Ser: 1.01 mg/dL (ref 0.61–1.24)
GFR calc Af Amer: >60 mL/min (ref >60)
GFR calc non Af Amer: >60 mL/min (ref >60)
Glucose, Bld: 75 mg/dL (ref 70–99)
Potassium: 3.1 mmol/L — ABNORMAL LOW (ref 3.5–5.1)
Sodium: 139 mmol/L (ref 135–145)

## 2020-04-11 LAB — TRIGLYCERIDES: Triglycerides: 280 mg/dL — ABNORMAL HIGH (ref <150)

## 2020-04-11 LAB — MAGNESIUM: Magnesium: 1.2 mg/dL — ABNORMAL LOW (ref 1.7–2.4)

## 2020-04-11 MED ORDER — POLYETHYLENE GLYCOL 3350 17 G PO PACK
17.0000 g | PACK | Freq: Every day | ORAL | Status: DC
  Administered 2020-04-11 – 2020-04-13 (×3): 17 g
  Filled 2020-04-11 (×3): qty 1

## 2020-04-11 MED ORDER — QUETIAPINE FUMARATE 100 MG PO TABS
100.0000 mg | ORAL_TABLET | Freq: Two times a day (BID) | ORAL | Status: DC
  Administered 2020-04-11 – 2020-04-12 (×3): 100 mg
  Filled 2020-04-11 (×3): qty 1

## 2020-04-11 MED ORDER — MIDAZOLAM HCL 2 MG/2ML IJ SOLN
2.0000 mg | INTRAMUSCULAR | Status: DC | PRN
  Administered 2020-04-11 – 2020-04-14 (×12): 2 mg via INTRAVENOUS
  Filled 2020-04-11 (×11): qty 2

## 2020-04-11 MED ORDER — MAGNESIUM SULFATE 4 GM/100ML IV SOLN
4.0000 g | Freq: Once | INTRAVENOUS | Status: AC
  Administered 2020-04-11: 4 g via INTRAVENOUS
  Filled 2020-04-11: qty 100

## 2020-04-11 MED ORDER — POTASSIUM CHLORIDE 10 MEQ/100ML IV SOLN
10.0000 meq | INTRAVENOUS | Status: AC
  Administered 2020-04-11 (×6): 10 meq via INTRAVENOUS
  Filled 2020-04-11 (×6): qty 100

## 2020-04-11 NOTE — Anesthesia Postprocedure Evaluation (Signed)
Anesthesia Post Note  Patient: Daniel [name]  Procedure(s) Performed: Right upper Extrimity INTRA OPERATIVE ARTERIOGRAM, Arch Aortogram, Second Order Catherization right Subclavian Artery. (Right Chest) AXILLA  ARTERY EXPLORATION, Repair of right Axillary Artery with reverse Left greater Saphenous Vein.   Ligation of Right Axillary Vein. (Right Arm Upper) EXTERNAL FIXATION ARM (Right Arm Upper)     Patient location during evaluation: SICU Anesthesia Type: General Level of consciousness: sedated Pain management: pain level controlled Vital Signs Assessment: post-procedure vital signs reviewed and stable Respiratory status: patient remains intubated per anesthesia plan Cardiovascular status: stable Postop Assessment: no apparent nausea or vomiting Anesthetic complications: no   No complications documented.  Last Vitals:  Vitals:   04/11/20 0600 04/11/20 0700  BP: (!) 147/48   Pulse: 72 75  Resp:    Temp: (!) 38.6 C (!) 38.4 C  SpO2: 100% 100%    Last Pain:  Vitals:   04/10/20 1600  TempSrc: Bladder  PainSc:                  Jeff Frieden S
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
## 2020-04-12 ENCOUNTER — Inpatient Hospital Stay (HOSPITAL_COMMUNITY): Payer: Self-pay

## 2020-04-12 LAB — CBC
HCT: 23.2 % — ABNORMAL LOW (ref 39.0–52.0)
HCT: 22.8 % — ABNORMAL LOW (ref 39.0–52.0)
Hemoglobin: 7.8 g/dL — ABNORMAL LOW (ref 13.0–17.0)
Hemoglobin: 7.5 g/dL — ABNORMAL LOW (ref 13.0–17.0)
MCH: 30.1 pg (ref 26.0–34.0)
MCH: 29.6 pg (ref 26.0–34.0)
MCHC: 33.6 g/dL (ref 30.0–36.0)
MCHC: 32.9 g/dL (ref 30.0–36.0)
MCV: 89.6 fL (ref 80.0–100.0)
MCV: 90.1 fL (ref 80.0–100.0)
Platelets: 116 10*3/uL — ABNORMAL LOW (ref 150–400)
Platelets: 122 10*3/uL — ABNORMAL LOW (ref 150–400)
RBC: 2.59 MIL/uL — ABNORMAL LOW (ref 4.22–5.81)
RBC: 2.53 MIL/uL — ABNORMAL LOW (ref 4.22–5.81)
RDW: 13.8 % (ref 11.5–15.5)
RDW: 13.6 % (ref 11.5–15.5)
WBC: 11.5 10*3/uL — ABNORMAL HIGH (ref 4.0–10.5)
WBC: 10.1 10*3/uL (ref 4.0–10.5)
nRBC: 0 % (ref 0.0–0.2)
nRBC: 0 % (ref 0.0–0.2)

## 2020-04-12 LAB — BASIC METABOLIC PANEL
Anion gap: 11 (ref 5–15)
BUN: <5 mg/dL — ABNORMAL LOW (ref 6–20)
CO2: 22 mmol/L (ref 22–32)
Calcium: 7.8 mg/dL — ABNORMAL LOW (ref 8.9–10.3)
Chloride: 108 mmol/L (ref 98–111)
Creatinine, Ser: 0.95 mg/dL (ref 0.61–1.24)
GFR calc Af Amer: >60 mL/min (ref >60)
GFR calc non Af Amer: >60 mL/min (ref >60)
Glucose, Bld: 89 mg/dL (ref 70–99)
Potassium: 3.6 mmol/L (ref 3.5–5.1)
Sodium: 141 mmol/L (ref 135–145)

## 2020-04-12 LAB — TRIGLYCERIDES: Triglycerides: 275 mg/dL — ABNORMAL HIGH (ref <150)

## 2020-04-12 LAB — MAGNESIUM: Magnesium: 1.9 mg/dL (ref 1.7–2.4)

## 2020-04-12 MED ORDER — QUETIAPINE FUMARATE 25 MG PO TABS
50.0000 mg | ORAL_TABLET | Freq: Two times a day (BID) | ORAL | Status: DC
  Administered 2020-04-12 – 2020-04-13 (×3): 50 mg
  Filled 2020-04-12 (×3): qty 2

## 2020-04-12 MED ORDER — SODIUM CHLORIDE 0.9 % IV SOLN
2.0000 g | Freq: Three times a day (TID) | INTRAVENOUS | Status: DC
  Administered 2020-04-12 – 2020-04-16 (×12): 2 g via INTRAVENOUS
  Filled 2020-04-12 (×16): qty 2
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
## 2020-04-12 NOTE — Progress Notes (Signed)
     Subjective: 3 Days Post-Op Procedure(s) (LRB): Right upper Extrimity INTRA OPERATIVE ARTERIOGRAM, Arch Aortogram, Second Order Catherization right Subclavian Artery. (Right) AXILLA  ARTERY EXPLORATION, Repair of right Axillary Artery with reverse Left greater Saphenous Vein.   Ligation of Right Axillary Vein. (Right) EXTERNAL FIXATION ARM (Right)   Patient unable to report via talking, intubated.  Asked if pain is controlled, he shakes yes.  Currently they are trying to wean him off of the tube.      Objective:   VITALS:   Vitals:   04/12/20 0800 04/12/20 0900  BP: 119/71   Pulse: 85 (!) 108  Resp: (!) 22 14  Temp: 99.7 F (37.6 C) 100 F (37.8 C)  SpO2: 100% 100%   Good movement of his hand Incision: scant drainage Compartment soft  LABS Recent Labs    04/09/20 2318 04/10/20 0519 04/11/20 0501  HGB 9.7* 9.4* 7.6*  HCT 27.7* 27.4* 23.0*  WBC 11.7* 9.1 11.0*  PLT 96* 99* 92*    Recent Labs    04/09/20 1532 04/09/20 1706 04/09/20 2042 04/09/20 2318 04/11/20 0501  NA 142   < > 145 140 139  K 3.9   < > 3.4* 3.4* 3.1*  BUN 8  --   --  9 7  CREATININE 1.20  --   --  0.80 1.01  GLUCOSE 218*  --   --  94 75   < > = values in this interval not displayed.     Assessment/Plan: 3 Days Post-Op Procedure(s) (LRB): Right upper Extrimity INTRA OPERATIVE ARTERIOGRAM, Arch Aortogram, Second Order Catherization right Subclavian Artery. (Right) AXILLA  ARTERY EXPLORATION, Repair of right Axillary Artery with reverse Left greater Saphenous Vein.   Ligation of Right Axillary Vein. (Right) EXTERNAL FIXATION ARM (Right)    - Critical care per Trauma surgery - NWB right humerus - Pin care - Will have ortho trauma review films and plan for definitive fixation once stable and cleared for surgery on that arm by Vascular      Lanney Gins PA-C  [facility]  [place] [address], [place], [place] [address] Phone:  [phone] [email or web address] Facebook  [employer]
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
## 2020-04-12 NOTE — Progress Notes (Signed)
Attempted to call Phlebotomy about drawing STAT labs. No answer. Ripley Fraise D

## 2020-04-13 ENCOUNTER — Inpatient Hospital Stay (HOSPITAL_COMMUNITY): Payer: Self-pay

## 2020-04-13 ENCOUNTER — Encounter (HOSPITAL_COMMUNITY): Admission: EM | Disposition: A | Discharge status: Home / Self Care | Payer: Self-pay | Source: Home / Self Care

## 2020-04-13 ENCOUNTER — Inpatient Hospital Stay (HOSPITAL_COMMUNITY): Payer: Self-pay | Admitting: Certified Registered Nurse Anesthetist

## 2020-04-13 ENCOUNTER — Encounter (HOSPITAL_COMMUNITY): Payer: Self-pay | Admitting: General Surgery

## 2020-04-13 HISTORY — PX: ORIF HUMERUS FRACTURE: SHX2126

## 2020-04-13 HISTORY — PX: EXTERNAL FIXATION REMOVAL: SHX5040

## 2020-04-13 LAB — PREPARE FRESH FROZEN PLASMA
Unit division: 0
Unit division: 0
Unit division: 0
Unit division: 0
Unit division: 0
Unit division: 0
Unit division: 0
Unit division: 0
Unit division: 0

## 2020-04-13 LAB — TYPE AND SCREEN
ABO/RH(D): O POS
Antibody Screen: NEGATIVE
Unit division: 0
Unit division: 0
Unit division: 0
Unit division: 0
Unit division: 0
Unit division: 0
Unit division: 0
Unit division: 0
Unit division: 0
Unit division: 0
Unit division: 0
Unit division: 0
Unit division: 0
Unit division: 0

## 2020-04-13 LAB — BPAM FFP
Blood Product Expiration Date: 202109082359
Blood Product Expiration Date: 202109082359
Blood Product Expiration Date: 202109102359
Blood Product Expiration Date: 202109102359
Blood Product Expiration Date: 202109112359
Blood Product Expiration Date: 202109112359
Blood Product Expiration Date: 202109142359
Blood Product Expiration Date: 202109142359
Blood Product Expiration Date: 202109142359
Blood Product Expiration Date: 202109142359
ISSUE DATE / TIME: 202109031513
ISSUE DATE / TIME: 202109031513
ISSUE DATE / TIME: 202109031516
ISSUE DATE / TIME: 202109031516
ISSUE DATE / TIME: 202109031532
ISSUE DATE / TIME: 202109031532
ISSUE DATE / TIME: 202109031752
ISSUE DATE / TIME: 202109031752
ISSUE DATE / TIME: 202109040323
ISSUE DATE / TIME: 202109040323
Unit Type and Rh: 5100
Unit Type and Rh: 5100
Unit Type and Rh: 6200
Unit Type and Rh: 6200
Unit Type and Rh: 6200
Unit Type and Rh: 6200
Unit Type and Rh: 6200
Unit Type and Rh: 6200
Unit Type and Rh: 6200
Unit Type and Rh: 6200

## 2020-04-13 LAB — BPAM RBC
Blood Product Expiration Date: 202109242359
Blood Product Expiration Date: 202109262359
Blood Product Expiration Date: 202110052359
Blood Product Expiration Date: 202110052359
Blood Product Expiration Date: 202110052359
Blood Product Expiration Date: 202110052359
Blood Product Expiration Date: 202110052359
Blood Product Expiration Date: 202110052359
Blood Product Expiration Date: 202110052359
Blood Product Expiration Date: 202110052359
Blood Product Expiration Date: 202110052359
Blood Product Expiration Date: 202110052359
Blood Product Expiration Date: 202110052359
Blood Product Expiration Date: 202110072359
ISSUE DATE / TIME: 202109031503
ISSUE DATE / TIME: 202109031506
ISSUE DATE / TIME: 202109031512
ISSUE DATE / TIME: 202109031512
ISSUE DATE / TIME: 202109031532
ISSUE DATE / TIME: 202109031532
ISSUE DATE / TIME: 202109031633
ISSUE DATE / TIME: 202109031633
ISSUE DATE / TIME: 202109031752
ISSUE DATE / TIME: 202109031752
ISSUE DATE / TIME: 202109031752
ISSUE DATE / TIME: 202109031752
Unit Type and Rh: 5100
Unit Type and Rh: 5100
Unit Type and Rh: 5100
Unit Type and Rh: 5100
Unit Type and Rh: 5100
Unit Type and Rh: 5100
Unit Type and Rh: 5100
Unit Type and Rh: 5100
Unit Type and Rh: 5100
Unit Type and Rh: 5100
Unit Type and Rh: 5100
Unit Type and Rh: 5100
Unit Type and Rh: 5100
Unit Type and Rh: 5100

## 2020-04-13 LAB — BASIC METABOLIC PANEL
Anion gap: 10 (ref 5–15)
BUN: <5 mg/dL — ABNORMAL LOW (ref 6–20)
CO2: 22 mmol/L (ref 22–32)
Calcium: 7.7 mg/dL — ABNORMAL LOW (ref 8.9–10.3)
Chloride: 108 mmol/L (ref 98–111)
Creatinine, Ser: 0.98 mg/dL (ref 0.61–1.24)
GFR calc Af Amer: >60 mL/min (ref >60)
GFR calc non Af Amer: >60 mL/min (ref >60)
Glucose, Bld: 97 mg/dL (ref 70–99)
Potassium: 3.4 mmol/L — ABNORMAL LOW (ref 3.5–5.1)
Sodium: 140 mmol/L (ref 135–145)

## 2020-04-13 LAB — CBC
HCT: 20.7 % — ABNORMAL LOW (ref 39.0–52.0)
Hemoglobin: 7.2 g/dL — ABNORMAL LOW (ref 13.0–17.0)
MCH: 31 pg (ref 26.0–34.0)
MCHC: 34.8 g/dL (ref 30.0–36.0)
MCV: 89.2 fL (ref 80.0–100.0)
Platelets: 145 10*3/uL — ABNORMAL LOW (ref 150–400)
RBC: 2.32 MIL/uL — ABNORMAL LOW (ref 4.22–5.81)
RDW: 13.4 % (ref 11.5–15.5)
WBC: 9.2 10*3/uL (ref 4.0–10.5)
nRBC: 0 % (ref 0.0–0.2)

## 2020-04-13 LAB — TRIGLYCERIDES: Triglycerides: 258 mg/dL — ABNORMAL HIGH (ref <150)

## 2020-04-13 LAB — PREPARE RBC (CROSSMATCH)

## 2020-04-13 SURGERY — OPEN REDUCTION INTERNAL FIXATION (ORIF) DISTAL HUMERUS FRACTURE
Anesthesia: General | Site: Arm Upper | Laterality: Right

## 2020-04-13 MED ORDER — POTASSIUM CHLORIDE 10 MEQ/100ML IV SOLN
10.0000 meq | INTRAVENOUS | Status: AC
  Administered 2020-04-13 (×2): 10 meq via INTRAVENOUS
  Filled 2020-04-13 (×2): qty 100

## 2020-04-13 MED ORDER — PHENYLEPHRINE 40 MCG/ML (10ML) SYRINGE FOR IV PUSH (FOR BLOOD PRESSURE SUPPORT)
PREFILLED_SYRINGE | INTRAVENOUS | Status: AC
  Filled 2020-04-13: qty 10

## 2020-04-13 MED ORDER — METOCLOPRAMIDE HCL 5 MG PO TABS
5.0000 mg | ORAL_TABLET | Freq: Three times a day (TID) | ORAL | Status: DC | PRN
  Filled 2020-04-13: qty 2

## 2020-04-13 MED ORDER — DEXAMETHASONE SODIUM PHOSPHATE 10 MG/ML IJ SOLN
INTRAMUSCULAR | Status: DC | PRN
  Administered 2020-04-13: 5 mg via INTRAVENOUS

## 2020-04-13 MED ORDER — ONDANSETRON HCL 4 MG PO TABS: 4.0000 mg | ORAL_TABLET | Freq: Four times a day (QID) | ORAL | Status: DC | PRN

## 2020-04-13 MED ORDER — ONDANSETRON HCL 4 MG/2ML IJ SOLN
INTRAMUSCULAR | Status: DC | PRN
  Administered 2020-04-13: 4 mg via INTRAVENOUS

## 2020-04-13 MED ORDER — LIDOCAINE 2% (20 MG/ML) 5 ML SYRINGE
INTRAMUSCULAR | Status: AC
  Filled 2020-04-13: qty 5

## 2020-04-13 MED ORDER — ALBUTEROL SULFATE HFA 108 (90 BASE) MCG/ACT IN AERS
INHALATION_SPRAY | RESPIRATORY_TRACT | Status: DC | PRN
  Administered 2020-04-13 (×2): 4 via RESPIRATORY_TRACT

## 2020-04-13 MED ORDER — ONDANSETRON HCL 4 MG/2ML IJ SOLN: 4.0000 mg | Freq: Four times a day (QID) | INTRAMUSCULAR | Status: DC | PRN

## 2020-04-13 MED ORDER — DOCUSATE SODIUM 100 MG PO CAPS: 100.0000 mg | ORAL_CAPSULE | Freq: Two times a day (BID) | ORAL | Status: DC

## 2020-04-13 MED ORDER — DEXAMETHASONE SODIUM PHOSPHATE 10 MG/ML IJ SOLN
INTRAMUSCULAR | Status: AC
  Filled 2020-04-13: qty 2

## 2020-04-13 MED ORDER — METOCLOPRAMIDE HCL 5 MG/ML IJ SOLN: 5.0000 mg | Freq: Three times a day (TID) | INTRAMUSCULAR | Status: DC | PRN

## 2020-04-13 MED ORDER — SODIUM CHLORIDE 0.9% IV SOLUTION: Freq: Once | INTRAVENOUS | Status: DC

## 2020-04-13 MED ORDER — CEFAZOLIN SODIUM-DEXTROSE 1-4 GM/50ML-% IV SOLN
INTRAVENOUS | Status: DC | PRN
  Administered 2020-04-13: 1 g via INTRAVENOUS

## 2020-04-13 MED ORDER — LACTATED RINGERS IV SOLN: INTRAVENOUS | Status: DC | PRN

## 2020-04-13 MED ORDER — 0.9 % SODIUM CHLORIDE (POUR BTL) OPTIME
TOPICAL | Status: DC | PRN
  Administered 2020-04-13: 1000 mL

## 2020-04-13 MED ORDER — SODIUM CHLORIDE 0.9 % IV SOLN: INTRAVENOUS | Status: DC | PRN

## 2020-04-13 MED ORDER — ROCURONIUM BROMIDE 10 MG/ML (PF) SYRINGE
PREFILLED_SYRINGE | INTRAVENOUS | Status: DC | PRN
  Administered 2020-04-13: 20 mg via INTRAVENOUS
  Administered 2020-04-13 (×2): 50 mg via INTRAVENOUS

## 2020-04-13 MED ORDER — GLYCOPYRROLATE 0.2 MG/ML IJ SOLN
INTRAMUSCULAR | Status: DC | PRN
  Administered 2020-04-13: .2 mg via INTRAVENOUS

## 2020-04-13 MED ORDER — EPHEDRINE 5 MG/ML INJ
INTRAVENOUS | Status: AC
  Filled 2020-04-13: qty 10

## 2020-04-13 MED ORDER — MIDAZOLAM HCL 5 MG/5ML IJ SOLN
INTRAMUSCULAR | Status: DC | PRN
  Administered 2020-04-13: 2 mg via INTRAVENOUS

## 2020-04-13 MED ORDER — SUCCINYLCHOLINE CHLORIDE 200 MG/10ML IV SOSY
PREFILLED_SYRINGE | INTRAVENOUS | Status: AC
  Filled 2020-04-13: qty 20

## 2020-04-13 MED ORDER — KETAMINE HCL 10 MG/ML IJ SOLN
INTRAMUSCULAR | Status: DC | PRN
  Administered 2020-04-13: 10 mg via INTRAVENOUS
  Administered 2020-04-13 (×2): 20 mg via INTRAVENOUS

## 2020-04-13 MED ORDER — ROCURONIUM BROMIDE 10 MG/ML (PF) SYRINGE
PREFILLED_SYRINGE | INTRAVENOUS | Status: AC
  Filled 2020-04-13: qty 20

## 2020-04-13 MED ORDER — KETAMINE HCL 50 MG/5ML IJ SOSY
PREFILLED_SYRINGE | INTRAMUSCULAR | Status: AC
  Filled 2020-04-13: qty 5

## 2020-04-13 MED ORDER — MENTHOL 3 MG MT LOZG
1.0000 | LOZENGE | OROMUCOSAL | Status: DC | PRN
  Filled 2020-04-13: qty 9

## 2020-04-13 MED ORDER — PHENOL 1.4 % MT LIQD: 1.0000 | OROMUCOSAL | Status: DC | PRN

## 2020-04-13 MED ORDER — MIDAZOLAM HCL 2 MG/2ML IJ SOLN
INTRAMUSCULAR | Status: AC
  Filled 2020-04-13: qty 2

## 2020-04-13 SURGICAL SUPPLY — 89 items
BIT DRILL 2.5X110 QC LCP DISP (BIT) ×2 IMPLANT
BIT DRILL 2.8 (BIT) ×1
BIT DRILL CANN QC 2.8X165 (BIT) ×1 IMPLANT
BIT DRILL QC 3.5X110 (BIT) ×2 IMPLANT
BLADE AVERAGE 25X9 (BLADE) IMPLANT
BNDG COHESIVE 4X5 TAN STRL (GAUZE/BANDAGES/DRESSINGS) ×2 IMPLANT
BNDG ESMARK 4X9 LF (GAUZE/BANDAGES/DRESSINGS) IMPLANT
BNDG GAUZE ELAST 4 BULKY (GAUZE/BANDAGES/DRESSINGS) IMPLANT
BRUSH SCRUB EZ PLAIN DRY (MISCELLANEOUS) ×2 IMPLANT
CANISTER WOUNDNEG PRESSURE 500 (CANNISTER) ×2 IMPLANT
CLEANER TIP ELECTROSURG 2X2 (MISCELLANEOUS) ×2 IMPLANT
CNTNR URN SCR LID CUP LEK RST (MISCELLANEOUS) ×1 IMPLANT
CONT SPEC 4OZ STRL OR WHT (MISCELLANEOUS) ×1
CORD BIPOLAR FORCEPS 12FT (ELECTRODE) ×2 IMPLANT
COVER SURGICAL LIGHT HANDLE (MISCELLANEOUS) ×2 IMPLANT
COVER WAND RF STERILE (DRAPES) ×2 IMPLANT
DRAIN PENROSE 1/4X12 LTX STRL (WOUND CARE) IMPLANT
DRAPE C-ARM 42X72 X-RAY (DRAPES) ×2 IMPLANT
DRAPE C-ARMOR (DRAPES) IMPLANT
DRAPE HALF SHEET 40X57 (DRAPES) ×4 IMPLANT
DRAPE INCISE IOBAN 66X45 STRL (DRAPES) IMPLANT
DRAPE ORTHO SPLIT 77X108 STRL (DRAPES) ×1
DRAPE SURG ORHT 6 SPLT 77X108 (DRAPES) ×1 IMPLANT
DRAPE U-SHAPE 47X51 STRL (DRAPES) ×4 IMPLANT
DRESSING PREVENA PLUS CUSTOM (GAUZE/BANDAGES/DRESSINGS) ×1 IMPLANT
DRILL BIT 2.8MM (BIT) ×1
DRSG ADAPTIC 3X8 NADH LF (GAUZE/BANDAGES/DRESSINGS) IMPLANT
DRSG MEPILEX BORDER 4X12 (GAUZE/BANDAGES/DRESSINGS) ×2 IMPLANT
DRSG MEPITEL 4X7.2 (GAUZE/BANDAGES/DRESSINGS) IMPLANT
DRSG PAD ABDOMINAL 8X10 ST (GAUZE/BANDAGES/DRESSINGS) IMPLANT
DRSG PREVENA PLUS CUSTOM (GAUZE/BANDAGES/DRESSINGS) ×2
ELECT REM PT RETURN 9FT ADLT (ELECTROSURGICAL) ×2
ELECTRODE REM PT RTRN 9FT ADLT (ELECTROSURGICAL) ×1 IMPLANT
EVACUATOR 1/8 PVC DRAIN (DRAIN) IMPLANT
GAUZE SPONGE 4X4 12PLY STRL (GAUZE/BANDAGES/DRESSINGS) IMPLANT
GAUZE SPONGE 4X4 12PLY STRL LF (GAUZE/BANDAGES/DRESSINGS) ×2 IMPLANT
GLOVE BIO SURGEON STRL SZ7.5 (GLOVE) ×4 IMPLANT
GLOVE BIOGEL PI IND STRL 7.5 (GLOVE) ×1 IMPLANT
GLOVE BIOGEL PI IND STRL 8 (GLOVE) ×1 IMPLANT
GLOVE BIOGEL PI INDICATOR 7.5 (GLOVE) ×1
GLOVE BIOGEL PI INDICATOR 8 (GLOVE) ×1
GOWN STRL REUS W/ TWL LRG LVL3 (GOWN DISPOSABLE) ×1 IMPLANT
GOWN STRL REUS W/ TWL XL LVL3 (GOWN DISPOSABLE) ×2 IMPLANT
GOWN STRL REUS W/TWL LRG LVL3 (GOWN DISPOSABLE) ×1
GOWN STRL REUS W/TWL XL LVL3 (GOWN DISPOSABLE) ×2
KIT BASIN OR (CUSTOM PROCEDURE TRAY) ×2 IMPLANT
KIT TURNOVER KIT B (KITS) ×2 IMPLANT
MANIFOLD NEPTUNE II (INSTRUMENTS) ×2 IMPLANT
NEEDLE HYPO 25X1 1.5 SAFETY (NEEDLE) IMPLANT
NS IRRIG 1000ML POUR BTL (IV SOLUTION) ×2 IMPLANT
PACK ORTHO EXTREMITY (CUSTOM PROCEDURE TRAY) ×2 IMPLANT
PAD ARMBOARD 7.5X6 YLW CONV (MISCELLANEOUS) ×4 IMPLANT
PLATE LCP 3.5 18 HOLE (Plate) ×2 IMPLANT
SCREW CORTEX 3.5 24MM (Screw) ×1 IMPLANT
SCREW CORTEX 3.5 28MM (Screw) ×1 IMPLANT
SCREW CORTEX 3.5 30MM (Screw) ×1 IMPLANT
SCREW CORTEX 3.5 32MM (Screw) ×1 IMPLANT
SCREW CORTEX 3.5 38MM (Screw) ×1 IMPLANT
SCREW LOCK CORT ST 3.5X24 (Screw) ×1 IMPLANT
SCREW LOCK CORT ST 3.5X28 (Screw) ×1 IMPLANT
SCREW LOCK CORT ST 3.5X30 (Screw) ×1 IMPLANT
SCREW LOCK CORT ST 3.5X32 (Screw) ×1 IMPLANT
SCREW LOCK CORT ST 3.5X38 (Screw) ×1 IMPLANT
SCREW LOCK T15 FT 28X3.5X2.9X (Screw) ×1 IMPLANT
SCREW LOCK T15 FT 32X3.5X2.9X (Screw) ×1 IMPLANT
SCREW LOCK T15 FT 36X3.5X2.9X (Screw) ×1 IMPLANT
SCREW LOCKING 3.5X26 (Screw) ×2 IMPLANT
SCREW LOCKING 3.5X28 (Screw) ×1 IMPLANT
SCREW LOCKING 3.5X32 (Screw) ×1 IMPLANT
SCREW LOCKING 3.5X36 (Screw) ×1 IMPLANT
SPONGE LAP 18X18 RF (DISPOSABLE) ×2 IMPLANT
STAPLER VISISTAT 35W (STAPLE) ×2 IMPLANT
STOCKINETTE IMPERVIOUS 9X36 MD (GAUZE/BANDAGES/DRESSINGS) ×2 IMPLANT
SUCTION FRAZIER HANDLE 10FR (MISCELLANEOUS) ×1
SUCTION TUBE FRAZIER 10FR DISP (MISCELLANEOUS) ×1 IMPLANT
SUT ETHILON 2 0 FS 18 (SUTURE) ×8 IMPLANT
SUT ETHILON 3 0 PS 1 (SUTURE) IMPLANT
SUT VIC AB 0 CT1 27 (SUTURE)
SUT VIC AB 0 CT1 27XBRD ANBCTR (SUTURE) IMPLANT
SUT VIC AB 1 CT1 36 (SUTURE) ×4 IMPLANT
SUT VIC AB 2-0 CT1 27 (SUTURE) ×3
SUT VIC AB 2-0 CT1 TAPERPNT 27 (SUTURE) ×3 IMPLANT
SYR 5ML LL (SYRINGE) IMPLANT
SYR CONTROL 10ML LL (SYRINGE) IMPLANT
TOWEL GREEN STERILE (TOWEL DISPOSABLE) ×4 IMPLANT
TOWEL GREEN STERILE FF (TOWEL DISPOSABLE) ×2 IMPLANT
TRAY FOLEY MTR SLVR 16FR STAT (SET/KITS/TRAYS/PACK) ×2 IMPLANT
WATER STERILE IRR 1000ML POUR (IV SOLUTION) ×2 IMPLANT
YANKAUER SUCT BULB TIP NO VENT (SUCTIONS) ×2 IMPLANT

## 2020-04-13 NOTE — Anesthesia Postprocedure Evaluation (Signed)
Anesthesia Post Note  Patient: Chartered certified accountant  Procedure(s) Performed: OPEN REDUCTION INTERNAL FIXATION (ORIF) DISTAL HUMERUS FRACTURE (Right Arm Upper) REMOVAL EXTERNAL FIXATION ARM (Right Arm Upper)     Patient location during evaluation: ICU Anesthesia Type: General Level of consciousness: sedated and patient remains intubated per anesthesia plan Pain management: pain level controlled Vital Signs Assessment: post-procedure vital signs reviewed and stable Respiratory status: patient remains intubated per anesthesia plan Cardiovascular status: stable Postop Assessment: no apparent nausea or vomiting Anesthetic complications: no   No complications documented.  Last Vitals:  Vitals:   04/13/20 1700 04/13/20 1800  BP: (!) 142/58 (!) 143/59  Pulse: 71 71  Resp: (!) 22 (!) 22  Temp:    SpO2: 100% 100%    Last Pain:  Vitals:   04/13/20 1648  TempSrc: Axillary  PainSc:                  Beryle Lathe
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
## 2020-04-13 NOTE — Transfer of Care (Addendum)
Immediate Anesthesia Transfer of Care Note  Patient: Daniel [name]  Procedure(s) Performed: OPEN REDUCTION INTERNAL FIXATION (ORIF) DISTAL HUMERUS FRACTURE (Right Arm Upper) REMOVAL EXTERNAL FIXATION ARM (Right Arm Upper)  Patient Location: ICU  Anesthesia Type:General  Level of Consciousness: Patient remains intubated per anesthesia plan  Airway & Oxygen Therapy: Patient remains intubated per anesthesia plan and Patient placed on Ventilator (see vital sign flow sheet for setting)  Post-op Assessment: Report given to RN and Post -op Vital signs reviewed and stable  Post vital signs: Reviewed and stable  Last Vitals:  Vitals Value Taken Time  BP 163/80 04/13/20 2200  Temp    Pulse 78   Resp 22 04/13/20 2205  SpO2 100   Vitals shown include unvalidated device data.  Last Pain:  Vitals:   04/13/20 1648  TempSrc: Axillary  PainSc:      Report to RN in 4N ICU, VSS, RT present at bedside, applied to ventilator, same previous settings, RN informed of foley insertion, blood administration and new fentanyl and propofol gtt titrations. Transfer of care of patient in safe and stable condition. RT aware of ETT securement at 28 cm, bilateral lung sounds present, clear to auscultation     Complications: No complications documented.

## 2020-04-13 NOTE — Progress Notes (Signed)
2+ radial pulse Able to flex hand some, not much extension Incisions healing  Fabienne Bruns, MD [facility] Office: [phone]

## 2020-04-13 NOTE — Progress Notes (Signed)
CCM made aware of ETT PIP due to pt's aggressive cough causing tube to bend in the back of pt's throat. RT is able to resolve issue temporarily but PIP quickly return. Volumes are adequately being delivered, this issue places pt in no distress. The alarm goes off often, RN is aware. Pt going to OR this afternoon, CCM hopeful for extubation at that time.

## 2020-04-13 NOTE — Progress Notes (Signed)
Orthopaedic Trauma Service   Attempted to contact sister as well Goes directly to VM  Will likely need to proceed as an emergency as the humerus does need definitive fixation to protect the vascular repair.  Fracture fragments can still move with the fixator on which puts the vascular repair at risk   Mearl Latin, PA-C [phone] (C) 04/13/2020, 3:23 PM  [facility] [address] [place] [address] [phone] Collier Bullock (F)
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
## 2020-04-13 NOTE — Progress Notes (Signed)
Per Dr. Janee Morn, proceed with blood administration with emergent consent.
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
## 2020-04-13 NOTE — OR Nursing (Signed)
After assuming care of patient I was informed that the surgical case was deemed an emergency and no consent was obtained prior to surgery by the surgeon Inda Merlin MD and Montez Morita PA.
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
## 2020-04-13 NOTE — OR Nursing (Signed)
Care of patient assumed at 1908. 

## 2020-04-13 NOTE — TOC CAGE-AID Note (Signed)
Transition of Care [facility]) - CAGE-AID Screening   Patient Details  Name: Daniel [name] MRN: [id number] Date of Birth: 1991/01/16  [facility] CM/SW Contact:    Jimmy Picket, LCSWA Phone Number: 04/13/2020, 1:53 PM   Clinical Narrative:  Pt unable to participate in assessment due to pt being on vent.   CAGE-AID Screening: Substance Abuse Screening unable to be completed due to: : Patient unable to participate               Isabella Stalling Clinical Social Worker [phone]

## 2020-04-14 ENCOUNTER — Inpatient Hospital Stay (HOSPITAL_BASED_OUTPATIENT_CLINIC_OR_DEPARTMENT_OTHER): Payer: Self-pay

## 2020-04-14 ENCOUNTER — Inpatient Hospital Stay (HOSPITAL_COMMUNITY): Payer: Self-pay

## 2020-04-14 ENCOUNTER — Encounter (HOSPITAL_COMMUNITY): Payer: Self-pay | Admitting: General Surgery

## 2020-04-14 DIAGNOSIS — M79609 Pain in unspecified limb: Secondary | ICD-10-CM

## 2020-04-14 DIAGNOSIS — M7989 Other specified soft tissue disorders: Secondary | ICD-10-CM

## 2020-04-14 LAB — POCT I-STAT 7, (LYTES, BLD GAS, ICA,H+H)
Acid-base deficit: 1 mmol/L (ref 0.0–2.0)
Bicarbonate: 24.7 mmol/L (ref 20.0–28.0)
Calcium, Ion: 1.12 mmol/L — ABNORMAL LOW (ref 1.15–1.40)
HCT: 21 % — ABNORMAL LOW (ref 39.0–52.0)
Hemoglobin: 7.1 g/dL — ABNORMAL LOW (ref 13.0–17.0)
O2 Saturation: 100 %
Patient temperature: 36.7
Potassium: 3.8 mmol/L (ref 3.5–5.1)
Sodium: 140 mmol/L (ref 135–145)
TCO2: 26 mmol/L (ref 22–32)
pCO2 arterial: 45.3 mmHg (ref 32.0–48.0)
pH, Arterial: 7.343 — ABNORMAL LOW (ref 7.350–7.450)
pO2, Arterial: 339 mmHg — ABNORMAL HIGH (ref 83.0–108.0)

## 2020-04-14 LAB — BASIC METABOLIC PANEL
Anion gap: 7 (ref 5–15)
BUN: 6 mg/dL (ref 6–20)
CO2: 21 mmol/L — ABNORMAL LOW (ref 22–32)
Calcium: 7.8 mg/dL — ABNORMAL LOW (ref 8.9–10.3)
Chloride: 109 mmol/L (ref 98–111)
Creatinine, Ser: 0.87 mg/dL (ref 0.61–1.24)
GFR calc Af Amer: >60 mL/min (ref >60)
GFR calc non Af Amer: >60 mL/min (ref >60)
Glucose, Bld: 109 mg/dL — ABNORMAL HIGH (ref 70–99)
Potassium: 3.7 mmol/L (ref 3.5–5.1)
Sodium: 137 mmol/L (ref 135–145)

## 2020-04-14 LAB — CBC
HCT: 25.4 % — ABNORMAL LOW (ref 39.0–52.0)
Hemoglobin: 8.5 g/dL — ABNORMAL LOW (ref 13.0–17.0)
MCH: 30 pg (ref 26.0–34.0)
MCHC: 33.5 g/dL (ref 30.0–36.0)
MCV: 89.8 fL (ref 80.0–100.0)
Platelets: 181 10*3/uL (ref 150–400)
RBC: 2.83 MIL/uL — ABNORMAL LOW (ref 4.22–5.81)
RDW: 13.9 % (ref 11.5–15.5)
WBC: 10 10*3/uL (ref 4.0–10.5)
nRBC: 0 % (ref 0.0–0.2)

## 2020-04-14 LAB — POCT I-STAT, CHEM 8
BUN: 3 mg/dL — ABNORMAL LOW (ref 6–20)
Calcium, Ion: 1.12 mmol/L — ABNORMAL LOW (ref 1.15–1.40)
Chloride: 105 mmol/L (ref 98–111)
Creatinine, Ser: 0.8 mg/dL (ref 0.61–1.24)
Glucose, Bld: 74 mg/dL (ref 70–99)
HCT: 25 % — ABNORMAL LOW (ref 39.0–52.0)
Hemoglobin: 8.5 g/dL — ABNORMAL LOW (ref 13.0–17.0)
Potassium: 5.3 mmol/L — ABNORMAL HIGH (ref 3.5–5.1)
Sodium: 139 mmol/L (ref 135–145)
TCO2: 22 mmol/L (ref 22–32)

## 2020-04-14 LAB — CULTURE, RESPIRATORY W GRAM STAIN: Culture: NORMAL

## 2020-04-14 LAB — VITAMIN D 25 HYDROXY (VIT D DEFICIENCY, FRACTURES): Vit D, 25-Hydroxy: 18.2 ng/mL — ABNORMAL LOW (ref 30–100)

## 2020-04-14 LAB — TRIGLYCERIDES: Triglycerides: 205 mg/dL — ABNORMAL HIGH (ref <150)

## 2020-04-14 MED ORDER — METHOCARBAMOL 500 MG PO TABS: 1000.0000 mg | ORAL_TABLET | Freq: Three times a day (TID) | ORAL | Status: DC

## 2020-04-14 MED ORDER — OXYCODONE HCL 5 MG PO TABS
5.0000 mg | ORAL_TABLET | ORAL | Status: DC | PRN
  Administered 2020-04-14 – 2020-04-15 (×5): 10 mg via ORAL
  Filled 2020-04-14 (×6): qty 2

## 2020-04-14 MED ORDER — ENOXAPARIN SODIUM 30 MG/0.3ML ~~LOC~~ SOLN
30.0000 mg | Freq: Two times a day (BID) | SUBCUTANEOUS | Status: DC
  Administered 2020-04-14: 30 mg via SUBCUTANEOUS
  Filled 2020-04-14: qty 0.3

## 2020-04-14 MED ORDER — HEPARIN (PORCINE) 25000 UT/250ML-% IV SOLN
1600.0000 [IU]/h | INTRAVENOUS | Status: AC
  Administered 2020-04-14: 1300 [IU]/h via INTRAVENOUS
  Administered 2020-04-15: 1600 [IU]/h via INTRAVENOUS
  Filled 2020-04-14 (×2): qty 250

## 2020-04-14 MED ORDER — DEXMEDETOMIDINE HCL IN NACL 400 MCG/100ML IV SOLN: 0.4000 ug/kg/h | INTRAVENOUS | Status: DC

## 2020-04-14 MED ORDER — GABAPENTIN 300 MG PO CAPS
300.0000 mg | ORAL_CAPSULE | Freq: Three times a day (TID) | ORAL | Status: DC
  Administered 2020-04-14 – 2020-04-16 (×7): 300 mg via ORAL
  Filled 2020-04-14 (×7): qty 1

## 2020-04-14 MED ORDER — HEPARIN BOLUS VIA INFUSION
4000.0000 [IU] | Freq: Once | INTRAVENOUS | Status: AC
  Administered 2020-04-14: 4000 [IU] via INTRAVENOUS
  Filled 2020-04-14: qty 4000

## 2020-04-14 MED ORDER — OXYCODONE HCL 5 MG/5ML PO SOLN: 5.0000 mg | ORAL | Status: DC | PRN

## 2020-04-14 MED ORDER — QUETIAPINE FUMARATE 50 MG PO TABS
50.0000 mg | ORAL_TABLET | Freq: Two times a day (BID) | ORAL | Status: DC
  Administered 2020-04-14 – 2020-04-16 (×5): 50 mg via ORAL
  Filled 2020-04-14: qty 1
  Filled 2020-04-14: qty 2
  Filled 2020-04-14 (×3): qty 1

## 2020-04-14 MED ORDER — OXYCODONE HCL 5 MG/5ML PO SOLN
5.0000 mg | ORAL | Status: DC | PRN
  Administered 2020-04-14: 5 mg
  Filled 2020-04-14: qty 10

## 2020-04-14 MED ORDER — ACETAMINOPHEN 500 MG PO TABS: 1000.0000 mg | ORAL_TABLET | Freq: Four times a day (QID) | ORAL | Status: DC

## 2020-04-14 MED ORDER — ASCORBIC ACID 500 MG PO TABS
1000.0000 mg | ORAL_TABLET | Freq: Every day | ORAL | Status: DC
  Administered 2020-04-14 – 2020-04-16 (×3): 1000 mg via ORAL
  Filled 2020-04-14 (×3): qty 2

## 2020-04-14 MED ORDER — CHLORHEXIDINE GLUCONATE CLOTH 2 % EX PADS
6.0000 | MEDICATED_PAD | Freq: Every day | CUTANEOUS | Status: DC
  Administered 2020-04-16: 6 via TOPICAL

## 2020-04-14 MED ORDER — POTASSIUM CHLORIDE CRYS ER 20 MEQ PO TBCR
40.0000 meq | EXTENDED_RELEASE_TABLET | Freq: Once | ORAL | Status: AC
  Administered 2020-04-14: 40 meq via ORAL
  Filled 2020-04-14: qty 2

## 2020-04-14 MED ORDER — DOCUSATE SODIUM 100 MG PO CAPS
100.0000 mg | ORAL_CAPSULE | Freq: Two times a day (BID) | ORAL | Status: DC
  Administered 2020-04-14 – 2020-04-16 (×5): 100 mg via ORAL
  Filled 2020-04-14 (×5): qty 1

## 2020-04-14 MED ORDER — METHOCARBAMOL 500 MG PO TABS
1000.0000 mg | ORAL_TABLET | Freq: Three times a day (TID) | ORAL | Status: DC
  Administered 2020-04-14 – 2020-04-16 (×7): 1000 mg via ORAL
  Filled 2020-04-14 (×7): qty 2

## 2020-04-14 MED ORDER — ACETAMINOPHEN 325 MG PO TABS
650.0000 mg | ORAL_TABLET | Freq: Four times a day (QID) | ORAL | Status: DC | PRN
  Administered 2020-04-15: 650 mg via ORAL
  Filled 2020-04-14: qty 2

## 2020-04-14 MED ORDER — POTASSIUM CHLORIDE 20 MEQ/15ML (10%) PO SOLN: 40.0000 meq | Freq: Once | ORAL | Status: DC

## 2020-04-14 MED ORDER — MORPHINE SULFATE (PF) 4 MG/ML IV SOLN
4.0000 mg | INTRAVENOUS | Status: DC | PRN
  Administered 2020-04-14 – 2020-04-15 (×10): 4 mg via INTRAVENOUS
  Filled 2020-04-14 (×11): qty 1

## 2020-04-14 MED ORDER — POLYETHYLENE GLYCOL 3350 17 G PO PACK
17.0000 g | PACK | Freq: Every day | ORAL | Status: DC
  Administered 2020-04-15 – 2020-04-16 (×2): 17 g via ORAL
  Filled 2020-04-14 (×2): qty 1
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
## 2020-04-14 NOTE — Procedures (Signed)
Extubation Procedure Note  Patient Details:   Name: Daniel [name] DOB: January 10, 1991 MRN: [id number]   Airway Documentation:    Vent end date: 04/14/20 Vent end time: 0840   Evaluation  O2 sats: stable throughout Complications: No apparent complications Patient did tolerate procedure well. Bilateral Breath Sounds: Clear   Yes   Positive cuff leak, no apparent complications. Pt on 2L [place].  Dewain Penning T 04/14/2020, 8:53 AM

## 2020-04-14 NOTE — Progress Notes (Signed)
Orthopedic Tech Progress Note Patient Details:  Toren Tucholski March 11, 1991 [id number]  Ortho Devices Type of Ortho Device: Shoulder immobilizer Ortho Device/Splint Location: Right Upper Extremity Ortho Device/Splint Interventions: Ordered     Delivered arm sling to pts room  Meryl Hubers P Harle Stanford 04/14/2020, 12:25 AM

## 2020-04-14 NOTE — Progress Notes (Addendum)
Right upper extremity venous duplex completed. Refer to "CV Proc" under chart review to view preliminary results.  Preliminary results discussed with RN and Dr. Chestine Spore. Attempted to page Dr. Darrick Penna at [phone].   04/14/2020 3:56 PM Eula Fried., MHA, RVT, RDCS, RDMS

## 2020-04-14 NOTE — Progress Notes (Signed)
Right brachial vein DVT Will start heparin today and transition to oral anticoagulation prior to d/c Will need 3 months treatment  Fabienne Bruns, MD [facility] Office: [phone]
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
## 2020-04-14 NOTE — Progress Notes (Signed)
[facility]  Subjective  - awaiting extubation   Objective 138/78 94 98.5 F (36.9 C) (Axillary) 14 100%  Intake/Output Summary (Last 24 hours) at 04/14/2020 0836 Last data filed at 04/14/2020 0700 Gross per 24 hour  Intake 5084.7 ml  Output 4545 ml  Net 539.7 ml   Right arm 2+ right radial pulse Left groin incision healing Diffuse edema right arm  Assessment/Planning: Check DVT US right arm today Patent bypass right arm  Fabienne Bruns 04/14/2020 8:36 AM --  Laboratory Lab Results: Recent Labs    04/13/20 0400 04/13/20 1923 04/13/20 2007 04/14/20 0448  WBC 9.2  --   --  10.0  HGB 7.2*   < > 7.1* 8.5*  HCT 20.7*   < > 21.0* 25.4*  PLT 145*  --   --  181   < > = values in this interval not displayed.   BMET Recent Labs    04/13/20 0400 04/13/20 0400 04/13/20 1923 04/13/20 1923 04/13/20 2007 04/14/20 0448  NA 140   < > 139   < > 140 137  K 3.4*   < > 5.3*   < > 3.8 3.7  CL 108   < > 105  --   --  109  CO2 22  --   --   --   --  21*  GLUCOSE 97   < > 74  --   --  109*  BUN <5*   < > 3*  --   --  6  CREATININE 0.98   < > 0.80  --   --  0.87  CALCIUM 7.7*  --   --   --   --  7.8*   < > = values in this interval not displayed.    COAG Lab Results  Component Value Date   INR 1.3 (H) 04/10/2020   INR 1.5 (H) 04/09/2020   No results found for: PTT

## 2020-04-15 ENCOUNTER — Inpatient Hospital Stay (HOSPITAL_COMMUNITY): Payer: Self-pay

## 2020-04-15 LAB — CBC
HCT: 29.6 % — ABNORMAL LOW (ref 39.0–52.0)
Hemoglobin: 9.9 g/dL — ABNORMAL LOW (ref 13.0–17.0)
MCH: 29.5 pg (ref 26.0–34.0)
MCHC: 33.4 g/dL (ref 30.0–36.0)
MCV: 88.1 fL (ref 80.0–100.0)
Platelets: 231 10*3/uL (ref 150–400)
RBC: 3.36 MIL/uL — ABNORMAL LOW (ref 4.22–5.81)
RDW: 13.5 % (ref 11.5–15.5)
WBC: 13.5 10*3/uL — ABNORMAL HIGH (ref 4.0–10.5)
nRBC: 0 % (ref 0.0–0.2)

## 2020-04-15 LAB — BASIC METABOLIC PANEL
Anion gap: 11 (ref 5–15)
BUN: <5 mg/dL — ABNORMAL LOW (ref 6–20)
CO2: 21 mmol/L — ABNORMAL LOW (ref 22–32)
Calcium: 7.6 mg/dL — ABNORMAL LOW (ref 8.9–10.3)
Chloride: 107 mmol/L (ref 98–111)
Creatinine, Ser: 0.84 mg/dL (ref 0.61–1.24)
GFR calc Af Amer: >60 mL/min (ref >60)
GFR calc non Af Amer: >60 mL/min (ref >60)
Glucose, Bld: 109 mg/dL — ABNORMAL HIGH (ref 70–99)
Potassium: 3.4 mmol/L — ABNORMAL LOW (ref 3.5–5.1)
Sodium: 139 mmol/L (ref 135–145)

## 2020-04-15 LAB — HEPARIN LEVEL (UNFRACTIONATED): Heparin Unfractionated: 0.13 [IU]/mL — ABNORMAL LOW (ref 0.30–0.70)

## 2020-04-15 MED ORDER — TRAMADOL HCL 50 MG PO TABS
50.0000 mg | ORAL_TABLET | Freq: Four times a day (QID) | ORAL | Status: DC
  Administered 2020-04-15 – 2020-04-16 (×4): 50 mg via ORAL
  Filled 2020-04-15 (×4): qty 1

## 2020-04-15 MED ORDER — APIXABAN 5 MG PO TABS: 5.0000 mg | ORAL_TABLET | Freq: Two times a day (BID) | ORAL | Status: DC

## 2020-04-15 MED ORDER — OXYCODONE HCL 5 MG PO TABS
10.0000 mg | ORAL_TABLET | ORAL | Status: DC | PRN
  Administered 2020-04-15 – 2020-04-16 (×6): 15 mg via ORAL
  Filled 2020-04-15 (×6): qty 3

## 2020-04-15 MED ORDER — HEPARIN BOLUS VIA INFUSION
2000.0000 [IU] | Freq: Once | INTRAVENOUS | Status: AC
  Administered 2020-04-15: 2000 [IU] via INTRAVENOUS
  Filled 2020-04-15: qty 2000

## 2020-04-15 MED ORDER — APIXABAN 5 MG PO TABS
10.0000 mg | ORAL_TABLET | Freq: Two times a day (BID) | ORAL | Status: DC
  Administered 2020-04-15 – 2020-04-16 (×3): 10 mg via ORAL
  Filled 2020-04-15 (×3): qty 2

## 2020-04-15 NOTE — Progress Notes (Signed)
ANTICOAGULATION CONSULT NOTE  Pharmacy Consult for IV Heparin Indication:  Right brachial vein DVT  No Known Allergies  Patient Measurements: Height: 5\' 11"  (180.3 cm) Weight: 81 kg (178 lb 9.2 oz) IBW/kg (Calculated) : 75.3 Heparin Dosing Weight: 81 kg  Vital Signs: Temp: 98.4 F (36.9 C) (09/08 2230) Temp Source: Oral (09/08 2230) BP: 131/85 (09/08 2230) Pulse Rate: 97 (09/08 2230)  Labs: Recent Labs    04/13/20 0400 04/13/20 0400 04/13/20 1923 04/13/20 1923 04/13/20 2007 04/13/20 2007 04/14/20 0448 04/15/20 0047  HGB 7.2*   < > 8.5*   < > 7.1*   < > 8.5* 9.9*  HCT 20.7*   < > 25.0*   < > 21.0*  --  25.4* 29.6*  PLT 145*  --   --   --   --   --  181 231  HEPARINUNFRC  --   --   --   --   --   --   --  0.13*  CREATININE 0.98  --  0.80  --   --   --  0.87  --    < > = values in this interval not displayed.    Estimated Creatinine Clearance: 134.6 mL/min (by C-G formula based on SCr of 0.87 mg/dL).   Assessment: 29 y.o. male with RUE DVT for heparin  Goal of Therapy:  Heparin level 0.3-0.7 units/ml Monitor platelets by anticoagulation protocol: Yes   Plan:  Heparin 2000 units IV bolus, then increase heparin 1600 units/hr Check heparin level in 6 hours.   26, PharmD, BCPS    04/15/2020,2:15 AM

## 2020-04-15 NOTE — Progress Notes (Signed)
Patient called for assistance elevate his right arm while attempting to raise and trying to be as gentle as possible patient began cursing. He stated I was too rough so I then asked him to raise his arm himself and I slide the folded blanket he stated that he couldn't so I again attempted to raise his arm as gentle and he began cursing again I asked him to refrain from cursing me out he continued to use profanity I left the room at that time and told him I will not tolerate verbal abuse and when he can talk sensible and be respectful I will return. Ilean Skill LPN
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
## 2020-04-15 NOTE — Discharge Instructions (Addendum)
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 chest xray at either [facility] or [facility]. This will be outlined in your follow up instructions. Please call [facility] at [phone] if you have any questions about follow up.  b. If you have any orthopedic or other injuries you will need to follow up as outlined in your follow up instructions.   WHEN TO CALL us [phone]:  7. Poor pain control 8. Reactions / problems with new medications (rash/itching, nausea, etc)  9. Fever over 101.5 F (38.5 C) 10. Worsening swelling or bruising 11. Redness, drainage, pain or swelling around chest tube site 12. Worsening pain, productive cough, difficulty breathing or any other concerning symptoms  The clinic staff is available to answer your questions during regular business hours (8:30am-5pm). Please don't hesitate to call and ask to speak to one of our nurses for clinical concerns.  If you have a medical emergency, go to the nearest  emergency room or call 911.  A surgeon from [facility] is always on call at the [facility], [place]  [address], [place], [place] [address] ?  MAIN: [phone] ? TOLL FREE: [phone] ?  FAX [phone]  [email or web address]      Information on Rib Fractures  A rib fracture is a break or crack in one of the bones of the ribs. The ribs are long, curved bones that wrap around your chest and attach to your spine and your breastbone. The ribs protect your heart, lungs, and other organs in the chest. A broken or cracked rib is often painful but is not usually serious. Most rib fractures heal on their own over time. However, rib fractures can be more serious if multiple ribs are broken or if broken ribs move out of place and push against other structures or organs. What are the causes? This condition is caused by:  Repetitive movements with high force, such as pitching a baseball or having severe coughing spells.  A direct blow to the chest, such as a sports injury, a car accident, or a fall.  Cancer that has spread to the bones, which can weaken bones and cause them to break. What are the signs or symptoms? Symptoms of this condition include:  Pain when you breathe in or cough.  Pain when someone presses on the injured area.  Feeling short of breath. How is this diagnosed? This condition is diagnosed with a physical exam and medical history. Imaging tests may also be done, such as:  Chest X-ray.  CT scan.  MRI.  Bone scan.  Chest ultrasound. How is this treated? Treatment for this condition depends on the severity of the fracture. Most rib fractures usually heal on their own in 1-3 months. Sometimes healing takes longer if there is a cough that does not stop or if there are other activities that make the injury worse (aggravating factors). While you heal, you will be given medicines to control the  pain. You will also be taught deep breathing exercises. Severe injuries may require hospitalization or surgery. Follow these instructions at home: Managing pain, stiffness, and swelling  If directed, apply ice to the injured area. ? Put ice in a plastic bag. ? Place a towel between your skin and the bag. ?  Leave the ice on for 20 minutes, 2-3 times a day.  Take over-the-counter and prescription medicines only as told by your health care provider. Activity  Avoid a lot of activity and any activities or movements that cause pain. Be careful during activities and avoid bumping the injured rib.  Slowly increase your activity as told by your health care provider. General instructions  Do deep breathing exercises as told by your health care provider. This helps prevent pneumonia, which is a common complication of a broken rib. Your health care provider may instruct you to: ? Take deep breaths several times a day. ? Try to cough several times a day, holding a pillow against the injured area. ? Use a device called incentive spirometer to practice deep breathing several times a day.  Drink enough fluid to keep your urine pale yellow.  Do not wear a rib belt or binder. These restrict breathing, which can lead to pneumonia.  Keep all follow-up visits as told by your health care provider. This is important. Contact a health care provider if:  You have a fever. Get help right away if:  You have difficulty breathing or you are short of breath.  You develop a cough that does not stop, or you cough up thick or bloody sputum.  You have nausea, vomiting, or pain in your abdomen.  Your pain gets worse and medicine does not help. Summary  A rib fracture is a break or crack in one of the bones of the ribs.  A broken or cracked rib is often painful but is not usually serious.  Most rib fractures heal on their own over time.  Treatment for this condition depends on the severity of the  fracture.  Avoid a lot of activity and any activities or movements that cause pain. This information is not intended to replace advice given to you by your health care provider. Make sure you discuss any questions you have with your health care provider. Document Released: 07/24/2005 Document Revised: 10/23/2016 Document Reviewed: 10/23/2016 Elsevier Interactive Patient Education  2019 [employer]    Pneumothorax A pneumothorax is commonly called a collapsed lung. It is a condition in which air leaks from a lung and builds up between the thin layer of tissue that covers the lungs (visceral pleura) and the interior wall of the chest cavity (parietal pleura). The air gets trapped outside the lung, between the lung and the chest wall (pleural space). The air takes up space and prevents the lung from fully expanding. This condition sometimes occurs suddenly with no apparent cause. The buildup of air may be small or large. A small pneumothorax may go away on its own. A large pneumothorax will require treatment and hospitalization. What are the causes? This condition may be caused by:  Trauma and injury to the chest wall.  Surgery and other medical procedures.  A complication of an underlying lung problem, especially chronic obstructive pulmonary disease (COPD) or emphysema. Sometimes the cause of this condition is not known. What increases the risk? You are more likely to develop this condition if:  You have an underlying lung problem.  You smoke.  You are 32-36 years old, male, tall, and underweight.  You have a personal or family history of pneumothorax.  You have an eating disorder (anorexia nervosa). This condition can also happen quickly, even in people with no history of lung problems. What are the signs or symptoms? Sometimes a pneumothorax will have no symptoms. When symptoms are present, they  can include:  Chest pain.  Shortness of breath.  Increased rate of  breathing.  Bluish color to your lips or skin (cyanosis). How is this diagnosed? This condition may be diagnosed by:  A medical history and physical exam.  A chest X-ray, chest CT scan, or ultrasound. How is this treated? Treatment depends on how severe your condition is. The goal of treatment is to remove the extra air and allow your lung to expand back to its normal size.  For a small pneumothorax: ? No treatment may be needed. ? Extra oxygen is sometimes used to make it go away more quickly.  For a large pneumothorax or a pneumothorax that is causing symptoms, a procedure is done to drain the air from your lungs. To do this, a health care provider may use: ? A needle with a syringe. This is used to suck air from a pleural space where no additional leakage is taking place. ? A chest tube. This is used to suck air where there is ongoing leakage into the pleural space. The chest tube may need to remain in place for several days until the air leak has healed.  In more severe cases, surgery may be needed to repair the damage that is causing the leak.  If you have multiple pneumothorax episodes or have an air leak that will not heal, a procedure called a pleurodesis may be done. A medicine is placed in the pleural space to irritate the tissues around the lung so that the lung will stick to the chest wall, seal any leaks, and stop any buildup of air in that space. If you have an underlying lung problem, severe symptoms, or a large pneumothorax you will usually need to stay in the hospital. Follow these instructions at home: Lifestyle  Do not use any products that contain nicotine or tobacco, such as cigarettes and e-cigarettes. These are major risk factors in pneumothorax. If you need help quitting, ask your health care provider.  Do not lift anything that is heavier than 10 lb (4.5 kg), or the limit that your health care provider tells you, until he or she says that it is safe.  Avoid  activities that take a lot of effort (strenuous) for as long as told by your health care provider.  Return to your normal activities as told by your health care provider. Ask your health care provider what activities are safe for you.  Do not fly in an airplane or scuba dive until your health care provider says it is okay. General instructions  Take over-the-counter and prescription medicines only as told by your health care provider.  If a cough or pain makes it difficult for you to sleep at night, try sleeping in a semi-upright position in a recliner or by using 2 or 3 pillows.  If you had a chest tube and it was removed, ask your health care provider when you can remove the bandage (dressing). While the dressing is in place, do not allow it to get wet.  Keep all follow-up visits as told by your health care provider. This is important. Contact a health care provider if:  You cough up thick mucus (sputum) that is yellow or green in color.  You were treated with a chest tube, and you have redness, increasing pain, or discharge at the site where it was placed. Get help right away if:  You have increasing chest pain or shortness of breath.  You have a cough that will not go  away.  You begin coughing up blood.  You have pain that is getting worse or is not controlled with medicines.  The site where your chest tube was located opens up.  You feel air coming out of the site where the chest tube was placed.  You have a fever or persistent symptoms for more than 2-3 days.  You have a fever and your symptoms suddenly get worse. These symptoms may represent a serious problem that is an emergency. Do not wait to see if the symptoms will go away. Get medical help right away. Call your local emergency services (911 in the [place] Do not drive yourself to the hospital. Summary  A pneumothorax, commonly called a collapsed lung, is a condition in which air leaks from a lung and gets trapped  between the lung and the chest wall (pleural space).  The buildup of air may be small or large. A small pneumothorax may go away on its own. A large pneumothorax will require treatment and hospitalization.  Treatment for this condition depends on how severe the pneumothorax is. The goal of treatment is to remove the extra air and allow the lung to expand back to its normal size. This information is not intended to replace advice given to you by your health care provider. Make sure you discuss any questions you have with your health care provider. Document Released: 07/24/2005 Document Revised: 07/02/2017 Document Reviewed: 07/02/2017 Elsevier Interactive Patient Education  2019 [employer].    Sutures, Staples, or Adhesive Wound Closure Please follow up with Vascular Surgery for staple removal Wound closure refers to holding skin and underlying tissue together while it heals, such as after surgery or after an injury. Health care providers use stitches (sutures), staples, and special types of glue (skin adhesives) to close wounds. Your health care provider will use a wound closure method that helps you heal quickly and reduces the chances of infection or scarring. The type of wound closure depends on the location, size, and depth of your wound. In most cases, wounds are closed as soon as possible (primary skin closure). Sometimes, closure is delayed so the wound can be cleaned and then can heal naturally over weeks or months (delayed wound closure). This reduces the chance of infection. What are the different types of wound closure? Adhesive glue To use adhesive glue, your health care provider holds the edges of the wound together and paints the glue on the surface of your skin. You may need more than one layer of glue. Once the glue is dry, the wound may be covered with a dressing. This type of skin closure may be used for small wounds that are not deep (superficial wounds). It is often used for  children and on facial wounds. Adhesive glue is less painful than other methods of wound closure, and it does not require a medicine to numb the area (local anesthetic). This method also leaves nothing to be removed. Adhesive glue cannot be used for wounds that are deep, uneven, or bleeding. It is not used inside of a wound. Adhesive strips These strips are made of paper that is sticky (adhesive) and has many small holes in it (is porous). They are applied across your wound edges like a regular bandage. Adhesive strips may be used to close very shallow wounds. They may be used along with sutures to improve skin closure. Sutures Sutures come in many different materials, strengths, and sizes. They may break down as your wound heals (absorbable), or they may  need to be removed (nonabsorbable). Your health care provider sews your skin or the tissues under your skin together with sutures and a steel needle. Your skin edges may be closed in one long (continuous) stitch or in separate stitches. Then the sutures are tied and cut. Sutures can be used for all kinds of wounds. Absorbable sutures may be used to close tissues under the skin. Sutures can cause a skin reaction that can lead to infection. Staples When staples are used to close a wound, the edges of your skin on both sides of the wound are brought close together. A staple is placed across the wound, and an instrument secures the staple edges together. Staples are often used to close surgical incisions. They are faster to use than sutures, and they cause less skin reaction. Staples need to be removed using a tool that bends the staples away from your skin. Follow these instructions at home: Medicines  Take over-the-counter and prescription medicines only as told by your health care provider.  If you were prescribed an antibiotic medicine, take it as told by your health care provider. Do not stop taking the antibiotic even if you start to feel  better. Wound care   Follow instructions from your health care provider about how to take care of your wound and dressing.  Wash your hands with soap and water before and after touching your wound or dressing. If soap and water are not available, use hand sanitizer.  Do not try to remove your wound closures unless your health care provider tells you to do that. You may need a follow-up visit with your health care provider to remove your closures. ? Wound closures may stay in place for 2 weeks or longer. ? Absorbable sutures may dissolve after a few days or weeks. ? If adhesive strip edges start to loosen and curl up, you may trim the loose edges.  Do not pick at your wound. Picking can cause an infection.  Apply ointments or creams only as told by your health care provider.  Check your wound every day for signs of infection. Check for: ? Redness, swelling, or pain. ? Fluid or blood. ? Warmth. ? Pus or a bad smell. General instructions  Do not take baths, swim, or use a hot tub until your health care provider approves. Ask your health care provider if you may take showers. You may only be allowed to take sponge baths.  Do not soak your wound in water.  Keep all follow-up visits as told by your health care provider. This is important. Contact a health care provider if you:  Have a fever or chills.  Have redness, swelling, or pain around your wound.  Have fluid or blood coming from your wound.  Notice that your wound feels warm to the touch.  Notice pus or a bad smell coming from your wound.  Notice that the edges of your wound start to separate after your sutures come out.  Notice that your wound becomes thick, raised, and darker in color after your sutures come out (scarring). Summary  The type of wound closure that your health care provider will use depends on the location, size, and depth of your wound. Options to close wounds include stitches (sutures), staples,  special types of glue (skin adhesives), and adhesive strips.  Your health care provider will use a wound closure method that helps you heal quickly and reduces the chances of infection or scarring.  Do not soak your wound in  water. Do not take baths, shower, swim, or use a hot tub until your health care provider approves. This information is not intended to replace advice given to you by your health care provider. Make sure you discuss any questions you have with your health care provider. Document Revised: 07/06/2017 Document Reviewed: 05/31/2017 Elsevier Patient Education  2020 [employer].
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
## 2020-04-15 NOTE — Plan of Care (Signed)
°  Problem: Clinical Measurements: Goal: Ability to maintain clinical measurements within normal limits will improve Outcome: Progressing Goal: Will remain free from infection Outcome: Progressing Goal: Diagnostic test results will improve Outcome: Progressing Goal: Respiratory complications will improve Outcome: Progressing Goal: Cardiovascular complication will be avoided Outcome: Progressing   Problem: Activity: Goal: Risk for activity intolerance will decrease Outcome: Progressing   Problem: Nutrition: Goal: Adequate nutrition will be maintained Outcome: Progressing   Problem: Coping: Goal: Level of anxiety will decrease Outcome: Not Progressing   Problem: Elimination: Goal: Will not experience complications related to bowel motility Outcome: Not Progressing Goal: Will not experience complications related to urinary retention Outcome: Progressing   Problem: Pain Managment: Goal: General experience of comfort will improve Outcome: Not Progressing   Problem: Safety: Goal: Ability to remain free from injury will improve Outcome: Progressing   Problem: Skin Integrity: Goal: Risk for impaired skin integrity will decrease Outcome: Progressing
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
## 2020-04-16 LAB — BASIC METABOLIC PANEL
Anion gap: 7 (ref 5–15)
BUN: <5 mg/dL — ABNORMAL LOW (ref 6–20)
CO2: 25 mmol/L (ref 22–32)
Calcium: 8.2 mg/dL — ABNORMAL LOW (ref 8.9–10.3)
Chloride: 104 mmol/L (ref 98–111)
Creatinine, Ser: 0.75 mg/dL (ref 0.61–1.24)
GFR calc Af Amer: >60 mL/min (ref >60)
GFR calc non Af Amer: >60 mL/min (ref >60)
Glucose, Bld: 105 mg/dL — ABNORMAL HIGH (ref 70–99)
Potassium: 3.5 mmol/L (ref 3.5–5.1)
Sodium: 136 mmol/L (ref 135–145)

## 2020-04-16 LAB — CBC
HCT: 33.9 % — ABNORMAL LOW (ref 39.0–52.0)
Hemoglobin: 11.3 g/dL — ABNORMAL LOW (ref 13.0–17.0)
MCH: 29.6 pg (ref 26.0–34.0)
MCHC: 33.3 g/dL (ref 30.0–36.0)
MCV: 88.7 fL (ref 80.0–100.0)
Platelets: 340 10*3/uL (ref 150–400)
RBC: 3.82 MIL/uL — ABNORMAL LOW (ref 4.22–5.81)
RDW: 13.2 % (ref 11.5–15.5)
WBC: 11.4 10*3/uL — ABNORMAL HIGH (ref 4.0–10.5)
nRBC: 0 % (ref 0.0–0.2)

## 2020-04-16 LAB — CULTURE, BLOOD (ROUTINE X 2)
Culture: NO GROWTH
Culture: NO GROWTH

## 2020-04-16 MED ORDER — METHOCARBAMOL 500 MG PO TABS: 1000.0000 mg | ORAL_TABLET | Freq: Three times a day (TID) | ORAL | 0 refills | Status: DC | PRN

## 2020-04-16 MED ORDER — APIXABAN 5 MG PO TABS
5.0000 mg | ORAL_TABLET | Freq: Two times a day (BID) | ORAL | 2 refills | Status: DC
Start: 2020-04-22 — End: 2020-05-04

## 2020-04-16 MED ORDER — DOCUSATE SODIUM 100 MG PO CAPS: 100.0000 mg | ORAL_CAPSULE | Freq: Two times a day (BID) | ORAL | 0 refills | Status: DC | PRN

## 2020-04-16 MED ORDER — ASCORBIC ACID 1000 MG PO TABS: 1000.0000 mg | ORAL_TABLET | Freq: Every day | ORAL | Status: DC

## 2020-04-16 MED ORDER — OXYCODONE HCL 10 MG PO TABS
10.0000 mg | ORAL_TABLET | Freq: Four times a day (QID) | ORAL | 0 refills | Status: DC | PRN
Start: 2020-04-16 — End: 2020-04-30

## 2020-04-16 MED ORDER — ACETAMINOPHEN 500 MG PO TABS
1000.0000 mg | ORAL_TABLET | Freq: Four times a day (QID) | ORAL | Status: DC
  Administered 2020-04-16: 1000 mg via ORAL
  Filled 2020-04-16: qty 2

## 2020-04-16 MED ORDER — VITAMIN D 125 MCG (5000 UT) PO CAPS: 1.0000 | ORAL_CAPSULE | Freq: Every day | ORAL | 3 refills | Status: DC

## 2020-04-16 MED ORDER — POLYETHYLENE GLYCOL 3350 17 G PO PACK: 17.0000 g | PACK | Freq: Every day | ORAL | 0 refills | Status: DC | PRN

## 2020-04-16 MED ORDER — TRAMADOL HCL 50 MG PO TABS
100.0000 mg | ORAL_TABLET | Freq: Four times a day (QID) | ORAL | 0 refills | Status: DC
Start: 2020-04-16 — End: 2020-05-04

## 2020-04-16 MED ORDER — ACETAMINOPHEN 500 MG PO TABS
1000.0000 mg | ORAL_TABLET | Freq: Three times a day (TID) | ORAL | 0 refills | Status: DC | PRN
Start: 2020-04-16 — End: 2020-05-04

## 2020-04-16 MED ORDER — APIXABAN 5 MG PO TABS
10.0000 mg | ORAL_TABLET | Freq: Two times a day (BID) | ORAL | 0 refills | Status: DC
Start: 2020-04-16 — End: 2020-05-04

## 2020-04-16 MED ORDER — TRAMADOL HCL 50 MG PO TABS
100.0000 mg | ORAL_TABLET | Freq: Four times a day (QID) | ORAL | Status: DC
  Administered 2020-04-16: 100 mg via ORAL
  Filled 2020-04-16: qty 2

## 2020-04-16 MED ORDER — ONDANSETRON HCL 4 MG PO TABS: 4.0000 mg | ORAL_TABLET | Freq: Four times a day (QID) | ORAL | 0 refills | Status: DC | PRN

## 2020-04-16 MED ORDER — GABAPENTIN 300 MG PO CAPS: 300.0000 mg | ORAL_CAPSULE | Freq: Three times a day (TID) | ORAL | 0 refills | Status: DC | PRN

## 2020-04-16 MED ORDER — MORPHINE SULFATE (PF) 2 MG/ML IV SOLN: 2.0000 mg | INTRAVENOUS | Status: DC | PRN

## 2020-04-16 MED FILL — ACETAMINOPHEN 500MG XT STRE: 500 | 10 days supply | Qty: 60 | Fill #0

## 2020-04-16 MED FILL — METHOCARBAMOL 500 MG TABS: 500 | 10 days supply | Qty: 60 | Fill #0

## 2020-04-16 MED FILL — VITAMIN D3 5,000 UNIT TAB: 125 MCG | 30 days supply | Qty: 30 | Fill #0

## 2020-04-16 MED FILL — traMADol HCL 50 MG TABS: 50 | 3 days supply | Qty: 30 | Fill #0

## 2020-04-16 MED FILL — ONDANSETRON HCL 4 MG TABLET: 4 | 5 days supply | Qty: 20 | Fill #0

## 2020-04-16 MED FILL — oxyCODONE HCL 10 MG TABS: 10 | 5 days supply | Qty: 20 | Fill #0

## 2020-04-16 MED FILL — ELIQUIS 5 MG TABLET: 5 | 30 days supply | Qty: 74 | Fill #0

## 2020-04-16 MED FILL — GABAPENTIN 300 MG CAPSULE: 300 | 20 days supply | Qty: 60 | Fill #0
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
## 2020-04-16 NOTE — Plan of Care (Signed)
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
## 2020-04-16 NOTE — Progress Notes (Signed)
IV and wound vac were discontinued.  Mepilex in place.  Discharge instructions were given to patient. Encouraged to wait for medications from [facility] pharmacy.  Patient has contacted his transportation and is waiting for pick up.

## 2020-04-16 NOTE — Plan of Care (Signed)
  Problem: Education: Goal: Knowledge of General Education information will improve Description Including pain rating scale, medication(s)/side effects and non-pharmacologic comfort measures Outcome: Progressing   

## 2020-04-17 LAB — BPAM RBC
Blood Product Expiration Date: 202110082359
Blood Product Expiration Date: 202110082359
Blood Product Expiration Date: 202110082359
ISSUE DATE / TIME: 202109071439
ISSUE DATE / TIME: 202109072021
ISSUE DATE / TIME: 202109072021
Unit Type and Rh: 5100
Unit Type and Rh: 5100
Unit Type and Rh: 5100

## 2020-04-17 LAB — TYPE AND SCREEN
ABO/RH(D): O POS
Antibody Screen: NEGATIVE
Unit division: 0
Unit division: 0
Unit division: 0

## 2020-04-20 ENCOUNTER — Encounter (HOSPITAL_COMMUNITY): Payer: Self-pay | Admitting: Licensed Clinical Social Worker

## 2020-04-22 ENCOUNTER — Emergency Department (HOSPITAL_COMMUNITY): Payer: Self-pay

## 2020-04-22 ENCOUNTER — Other Ambulatory Visit: Payer: Self-pay

## 2020-04-22 ENCOUNTER — Inpatient Hospital Stay (HOSPITAL_COMMUNITY)
Admission: EM | Admit: 2020-04-22 | Discharge: 2020-04-30 | DRG: 200 | Disposition: A | Discharge status: Home Health | Payer: Self-pay | Attending: General Surgery | Admitting: General Surgery

## 2020-04-22 ENCOUNTER — Encounter (HOSPITAL_COMMUNITY): Payer: Self-pay

## 2020-04-22 DIAGNOSIS — W3400XD Accidental discharge from unspecified firearms or gun, subsequent encounter: Secondary | ICD-10-CM

## 2020-04-22 DIAGNOSIS — R0789 Other chest pain: Secondary | ICD-10-CM

## 2020-04-22 DIAGNOSIS — J939 Pneumothorax, unspecified: Secondary | ICD-10-CM

## 2020-04-22 DIAGNOSIS — S272XXA Traumatic hemopneumothorax, initial encounter: Principal | ICD-10-CM | POA: Diagnosis present

## 2020-04-22 DIAGNOSIS — Z9689 Presence of other specified functional implants: Secondary | ICD-10-CM

## 2020-04-22 DIAGNOSIS — J942 Hemothorax: Secondary | ICD-10-CM | POA: Diagnosis present

## 2020-04-22 DIAGNOSIS — R079 Chest pain, unspecified: Secondary | ICD-10-CM | POA: Diagnosis not present

## 2020-04-22 DIAGNOSIS — F17210 Nicotine dependence, cigarettes, uncomplicated: Secondary | ICD-10-CM | POA: Diagnosis present

## 2020-04-22 DIAGNOSIS — J90 Pleural effusion, not elsewhere classified: Secondary | ICD-10-CM | POA: Diagnosis present

## 2020-04-22 DIAGNOSIS — Y92012 Bathroom of single-family (private) house as the place of occurrence of the external cause: Secondary | ICD-10-CM

## 2020-04-22 DIAGNOSIS — W182XXA Fall in (into) shower or empty bathtub, initial encounter: Secondary | ICD-10-CM | POA: Diagnosis present

## 2020-04-22 DIAGNOSIS — Z813 Family history of other psychoactive substance abuse and dependence: Secondary | ICD-10-CM

## 2020-04-22 DIAGNOSIS — S42301D Unspecified fracture of shaft of humerus, right arm, subsequent encounter for fracture with routine healing: Secondary | ICD-10-CM

## 2020-04-22 DIAGNOSIS — S299XXA Unspecified injury of thorax, initial encounter: Secondary | ICD-10-CM

## 2020-04-22 DIAGNOSIS — Z20822 Contact with and (suspected) exposure to covid-19: Secondary | ICD-10-CM | POA: Diagnosis present

## 2020-04-22 DIAGNOSIS — Z7901 Long term (current) use of anticoagulants: Secondary | ICD-10-CM

## 2020-04-22 DIAGNOSIS — W3400XA Accidental discharge from unspecified firearms or gun, initial encounter: Secondary | ICD-10-CM

## 2020-04-22 DIAGNOSIS — S2231XA Fracture of one rib, right side, initial encounter for closed fracture: Secondary | ICD-10-CM | POA: Diagnosis present

## 2020-04-22 DIAGNOSIS — Z86718 Personal history of other venous thrombosis and embolism: Secondary | ICD-10-CM

## 2020-04-22 DIAGNOSIS — Z4682 Encounter for fitting and adjustment of non-vascular catheter: Secondary | ICD-10-CM

## 2020-04-22 DIAGNOSIS — Z79899 Other long term (current) drug therapy: Secondary | ICD-10-CM

## 2020-04-22 HISTORY — DX: Accidental discharge from unspecified firearms or gun, initial encounter: W34.00XA

## 2020-04-22 LAB — BASIC METABOLIC PANEL
Anion gap: 9 (ref 5–15)
BUN: 11 mg/dL (ref 6–20)
CO2: 25 mmol/L (ref 22–32)
Calcium: 8.7 mg/dL — ABNORMAL LOW (ref 8.9–10.3)
Chloride: 104 mmol/L (ref 98–111)
Creatinine, Ser: 0.66 mg/dL (ref 0.61–1.24)
GFR calc Af Amer: >60 mL/min (ref >60)
GFR calc non Af Amer: >60 mL/min (ref >60)
Glucose, Bld: 98 mg/dL (ref 70–99)
Potassium: 4.1 mmol/L (ref 3.5–5.1)
Sodium: 138 mmol/L (ref 135–145)

## 2020-04-22 LAB — CBC
HCT: 37.2 % — ABNORMAL LOW (ref 39.0–52.0)
Hemoglobin: 11.8 g/dL — ABNORMAL LOW (ref 13.0–17.0)
MCH: 30.6 pg (ref 26.0–34.0)
MCHC: 31.7 g/dL (ref 30.0–36.0)
MCV: 96.4 fL (ref 80.0–100.0)
Platelets: 646 10*3/uL — ABNORMAL HIGH (ref 150–400)
RBC: 3.86 MIL/uL — ABNORMAL LOW (ref 4.22–5.81)
RDW: 14.2 % (ref 11.5–15.5)
WBC: 15.5 10*3/uL — ABNORMAL HIGH (ref 4.0–10.5)
nRBC: 0 % (ref 0.0–0.2)

## 2020-04-22 LAB — TROPONIN I (HIGH SENSITIVITY): Troponin I (High Sensitivity): <2 ng/L (ref <18)

## 2020-04-22 MED ORDER — IBUPROFEN 200 MG PO TABS: 600.0000 mg | ORAL_TABLET | Freq: Once | ORAL | Status: DC

## 2020-04-22 MED ORDER — LIDOCAINE 5 % EX PTCH
1.0000 | MEDICATED_PATCH | CUTANEOUS | Status: DC
  Administered 2020-04-23: 1 via TRANSDERMAL
  Filled 2020-04-22: qty 1

## 2020-04-22 MED ORDER — IOHEXOL 300 MG/ML  SOLN
100.0000 mL | Freq: Once | INTRAMUSCULAR | Status: AC | PRN
  Administered 2020-04-22: 100 mL via INTRAVENOUS

## 2020-04-22 MED ORDER — OXYCODONE-ACETAMINOPHEN 5-325 MG PO TABS
2.0000 | ORAL_TABLET | Freq: Once | ORAL | Status: AC
  Administered 2020-04-22: 2 via ORAL
  Filled 2020-04-22: qty 2
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
## 2020-04-22 NOTE — Discharge Instructions (Addendum)
It is highly recommended that you work on securing follow-up with the following offices: (You will need to call each office and set up individual appointments) The name of the office, address, and phone number are listed under the "Instructions" section of this paperwork.  . Arm injury/pain, Dr. Carola Frost: Follow up for continued care of your arm and associated broken bones. May also call his office for issues with continued pain. o They wanted you to be seen in the office two weeks from discharge, which may mean they want to see you sometime next week.  . Blood vessel injuries in arm; Dr. Darrick Penna:  o It is important that you follow-up with the surgeon due to the injury to the blood vessels. o They wanted you to be seen in the office two weeks from discharge, which may mean they want to see you sometime next week. . [facility] o Call to make an appointment for follow-up with this clinic to help work on function of the right arm. [phone])    Hemothorax  Hemothorax is a buildup of blood in the space between the lungs and the chest wall (pleural cavity). This can cause trouble breathing, a collapsed lung (pneumothorax), and other dangerous problems. This condition is a medical emergency that must be treated right away in a hospital. What are the causes? This condition is most often caused by an injury (trauma) that causes a tear in a lung or in a blood vessel in the chest. Other possible causes include:  Tuberculosis.  An injury caused by placing a tube into a blood vessel in the chest (central venous catheter).  Cancer in the chest.  A problem with how your blood clots.  Blood thinner (anticoagulant) medicines.  Lung surgery or heart surgery. What are the signs or symptoms? Signs and symptoms may include:  Rapid breathing.  Difficulty breathing.  Shortness of breath.  Feeling light-headed.  Anxiety.  Restlessness.  A rapid heart rate.  Low blood  pressure (hypotension).  Chest pain.  Skin that is cool, sweaty, pale, or blue. How is this diagnosed? This condition may be diagnosed based on:  Your symptoms and medical history. Your health care provider may ask about any recent injuries you have had.  A physical exam.  A chest X-ray.  Removal and testing of a blood sample from the pleural cavity. How is this treated? This condition must be treated at the hospital. Treatment may include:  A procedure to place a small tube into the pleural cavity (chest tube). The chest tube drains fluid, blood, and extra air. It can also be used to expand a pneumothorax, if needed.  Surgery to open the chest and control bleeding (thoracotomy). You may need surgery if bleeding continues after you have a chest tube placed.  IV fluids.  Blood transfusion. You may receive: ? Your own blood that is collected from a chest drainage device and infused back into your body (autotransfusion). ? Blood from a donor (blood transfusion). Follow these instructions at home: Medicines  Take over-the-counter and prescription medicines only as told by your health care provider.  Do not drive or use heavy machinery while taking prescription pain medicine. General instructions  Return to your normal activities as told by your health care provider. Ask your health care provider what activities are safe for you.  If a cough or pain makes it difficult for you to sleep at night, try sleeping in a semi-upright position in a recliner or by using 2 or 3  pillows.  If you were given an incentive spirometer, use it every 1-2 hours while you are awake or as recommended by your health care provider. This device measures how well you are filling your lungs with each breath.  If you had a chest tube and it was removed, ask your health care provider when you can remove the bandage (dressing). While the dressing is in place, do not allow it to get wet.  Keep all follow-up  visits as told by your health care provider. This is important.  Do not use any products that contain nicotine or tobacco, such as cigarettes and e-cigarettes. If you need help quitting, ask your health care provider. Contact a health care provider if:  Your pain is not controlled with the medicines you were prescribed.  You were treated with a chest tube, and you have redness, increasing pain, or discharge at the site where it was placed.  You have a fever. Get help right away if:  You have difficulty breathing.  You begin coughing up blood.  You have chest pain. These symptoms may represent a serious problem that is an emergency. Do not wait to see if the symptoms will go away. Get medical help right away. Call your local emergency services (911 in the [place] Do not drive yourself to the hospital. Summary  Hemothorax is a buildup of blood in the space between the lungs and the chest wall (pleural cavity).  This condition is a medical emergency that must be treated in a hospital right away.  Hemothorax is most often caused by an injury (trauma) that causes a tear in a lung or in a blood vessel in the chest. Other possible causes include tuberculosis, cancer, blood thinner (anticoagulant) medicines, lung or heart surgery, or a problem with how your blood clots.  Treatment includes receiving donated blood (blood transfusion) and having a small tube placed in the pleural cavity (chest tube). The tube can help drain blood, fluid, and extra air. It can also be used to expand a collapsed lung (pneumothorax).  In some cases, surgery is needed to stop the bleeding. This information is not intended to replace advice given to you by your health care provider. Make sure you discuss any questions you have with your health care provider. Document Revised: 08/20/2017 Document Reviewed: 08/20/2017 [employer] Patient Education  2020 [employer].
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
## 2020-04-23 ENCOUNTER — Encounter (HOSPITAL_COMMUNITY): Payer: Self-pay

## 2020-04-23 ENCOUNTER — Other Ambulatory Visit: Payer: Self-pay

## 2020-04-23 DIAGNOSIS — Y92012 Bathroom of single-family (private) house as the place of occurrence of the external cause: Secondary | ICD-10-CM | POA: Diagnosis not present

## 2020-04-23 DIAGNOSIS — W3400XD Accidental discharge from unspecified firearms or gun, subsequent encounter: Secondary | ICD-10-CM | POA: Diagnosis not present

## 2020-04-23 DIAGNOSIS — J942 Hemothorax: Secondary | ICD-10-CM | POA: Diagnosis present

## 2020-04-23 DIAGNOSIS — W182XXA Fall in (into) shower or empty bathtub, initial encounter: Secondary | ICD-10-CM | POA: Diagnosis present

## 2020-04-23 DIAGNOSIS — S42301D Unspecified fracture of shaft of humerus, right arm, subsequent encounter for fracture with routine healing: Secondary | ICD-10-CM | POA: Diagnosis not present

## 2020-04-23 DIAGNOSIS — F17210 Nicotine dependence, cigarettes, uncomplicated: Secondary | ICD-10-CM | POA: Diagnosis present

## 2020-04-23 DIAGNOSIS — S272XXA Traumatic hemopneumothorax, initial encounter: Secondary | ICD-10-CM | POA: Diagnosis present

## 2020-04-23 DIAGNOSIS — Z79899 Other long term (current) drug therapy: Secondary | ICD-10-CM | POA: Diagnosis not present

## 2020-04-23 DIAGNOSIS — Z20822 Contact with and (suspected) exposure to covid-19: Secondary | ICD-10-CM | POA: Diagnosis present

## 2020-04-23 DIAGNOSIS — Z86718 Personal history of other venous thrombosis and embolism: Secondary | ICD-10-CM | POA: Diagnosis not present

## 2020-04-23 DIAGNOSIS — J90 Pleural effusion, not elsewhere classified: Secondary | ICD-10-CM | POA: Diagnosis present

## 2020-04-23 DIAGNOSIS — Z7901 Long term (current) use of anticoagulants: Secondary | ICD-10-CM | POA: Diagnosis not present

## 2020-04-23 DIAGNOSIS — Z813 Family history of other psychoactive substance abuse and dependence: Secondary | ICD-10-CM | POA: Diagnosis not present

## 2020-04-23 DIAGNOSIS — S2231XA Fracture of one rib, right side, initial encounter for closed fracture: Secondary | ICD-10-CM | POA: Diagnosis present

## 2020-04-23 DIAGNOSIS — R079 Chest pain, unspecified: Secondary | ICD-10-CM | POA: Diagnosis present

## 2020-04-23 LAB — BASIC METABOLIC PANEL
Anion gap: 8 (ref 5–15)
BUN: 5 mg/dL — ABNORMAL LOW (ref 6–20)
CO2: 26 mmol/L (ref 22–32)
Calcium: 8.5 mg/dL — ABNORMAL LOW (ref 8.9–10.3)
Chloride: 102 mmol/L (ref 98–111)
Creatinine, Ser: 0.71 mg/dL (ref 0.61–1.24)
GFR calc Af Amer: >60 mL/min (ref >60)
GFR calc non Af Amer: >60 mL/min (ref >60)
Glucose, Bld: 95 mg/dL (ref 70–99)
Potassium: 3.8 mmol/L (ref 3.5–5.1)
Sodium: 136 mmol/L (ref 135–145)

## 2020-04-23 LAB — CBC
HCT: 36.4 % — ABNORMAL LOW (ref 39.0–52.0)
Hemoglobin: 11.6 g/dL — ABNORMAL LOW (ref 13.0–17.0)
MCH: 30 pg (ref 26.0–34.0)
MCHC: 31.9 g/dL (ref 30.0–36.0)
MCV: 94.1 fL (ref 80.0–100.0)
Platelets: 712 10*3/uL — ABNORMAL HIGH (ref 150–400)
RBC: 3.87 MIL/uL — ABNORMAL LOW (ref 4.22–5.81)
RDW: 14.1 % (ref 11.5–15.5)
WBC: 12.5 10*3/uL — ABNORMAL HIGH (ref 4.0–10.5)
nRBC: 0 % (ref 0.0–0.2)

## 2020-04-23 LAB — SARS CORONAVIRUS 2 BY RT PCR (HOSPITAL ORDER, PERFORMED IN ~~LOC~~ HOSPITAL LAB): SARS Coronavirus 2: NEGATIVE

## 2020-04-23 MED ORDER — ONDANSETRON 4 MG PO TBDP
4.0000 mg | ORAL_TABLET | Freq: Four times a day (QID) | ORAL | Status: DC | PRN
  Administered 2020-04-23: 4 mg via ORAL
  Filled 2020-04-23: qty 1

## 2020-04-23 MED ORDER — OXYCODONE HCL 5 MG PO TABS
10.0000 mg | ORAL_TABLET | ORAL | Status: DC | PRN
  Administered 2020-04-23 – 2020-04-26 (×10): 10 mg via ORAL
  Filled 2020-04-23 (×10): qty 2

## 2020-04-23 MED ORDER — ONDANSETRON HCL 4 MG/2ML IJ SOLN: 4.0000 mg | Freq: Four times a day (QID) | INTRAMUSCULAR | Status: DC | PRN

## 2020-04-23 MED ORDER — DOCUSATE SODIUM 100 MG PO CAPS
100.0000 mg | ORAL_CAPSULE | Freq: Two times a day (BID) | ORAL | Status: DC
  Administered 2020-04-23 – 2020-04-30 (×10): 100 mg via ORAL
  Filled 2020-04-23 (×15): qty 1

## 2020-04-23 MED ORDER — ENOXAPARIN SODIUM 40 MG/0.4ML ~~LOC~~ SOLN
40.0000 mg | Freq: Every day | SUBCUTANEOUS | Status: AC
  Administered 2020-04-23 – 2020-04-25 (×3): 40 mg via SUBCUTANEOUS
  Filled 2020-04-23 (×4): qty 0.4

## 2020-04-23 MED ORDER — SODIUM CHLORIDE 0.9 % IV SOLN: INTRAVENOUS | Status: DC

## 2020-04-23 MED ORDER — MORPHINE SULFATE (PF) 4 MG/ML IV SOLN
4.0000 mg | Freq: Once | INTRAVENOUS | Status: AC
  Administered 2020-04-23: 4 mg via INTRAVENOUS
  Filled 2020-04-23: qty 1

## 2020-04-23 MED ORDER — OXYCODONE HCL 5 MG PO TABS
5.0000 mg | ORAL_TABLET | ORAL | Status: DC | PRN
  Administered 2020-04-25: 5 mg via ORAL
  Filled 2020-04-23: qty 1

## 2020-04-23 MED ORDER — ONDANSETRON HCL 4 MG/2ML IJ SOLN
4.0000 mg | Freq: Once | INTRAMUSCULAR | Status: AC
  Administered 2020-04-23: 4 mg via INTRAVENOUS
  Filled 2020-04-23: qty 2

## 2020-04-23 MED ORDER — HYDROMORPHONE HCL 1 MG/ML IJ SOLN
1.0000 mg | INTRAMUSCULAR | Status: DC | PRN
  Administered 2020-04-23: 1 mg via INTRAVENOUS
  Filled 2020-04-23: qty 1

## 2020-04-23 MED ORDER — SODIUM CHLORIDE 0.9 % IV BOLUS
1000.0000 mL | Freq: Once | INTRAVENOUS | Status: AC
  Administered 2020-04-23: 1000 mL via INTRAVENOUS

## 2020-04-23 MED ORDER — GABAPENTIN 300 MG PO CAPS
300.0000 mg | ORAL_CAPSULE | Freq: Once | ORAL | Status: AC
  Administered 2020-04-23: 300 mg via ORAL
  Filled 2020-04-23: qty 1

## 2020-04-23 MED ORDER — ACETAMINOPHEN 325 MG PO TABS: 650.0000 mg | ORAL_TABLET | ORAL | Status: DC | PRN

## 2020-04-23 MED ORDER — HYDROMORPHONE HCL 1 MG/ML IJ SOLN
0.5000 mg | INTRAMUSCULAR | Status: DC | PRN
  Administered 2020-04-23 (×2): 0.5 mg via INTRAVENOUS
  Filled 2020-04-23 (×2): qty 1
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
## 2020-04-23 NOTE — ED Notes (Signed)
Patient states he has no feeling in his right thumb. Right grip strength weak in comparison to left.

## 2020-04-23 NOTE — ED Notes (Signed)
Pt requesting food, admitting doctor messaged to see if pt can eat

## 2020-04-23 NOTE — ED Notes (Signed)
Called [facility] °
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
## 2020-04-23 NOTE — Progress Notes (Signed)
Consult request has been received. CSW attempting to follow up at present time.  Per the EPD, pt is in need of HH and may benefit from PT/OT post D/C.  Per the chart pt does not have insurance and CSW updated EDP that the RN CM may be able to seek out [facility] services in the community that work with [place] to assist with charitable services if the EDP feels pt is safe for D/C tonight.  CSW asked EPD place order for face-to-face and an order for medical equipment and RN/Aide/PT/OT and Social Work so that the [facility] social worker may be able to assist the pt with further needs in the community post D/C.   CSW emphasized the CSW cannot say whether [facility] services can be found by the RN CM on duty on 9/18 and that it is possible [facility] services will refuse or those services may not be possible.  12:25 AM Update: Per EPD pt is to be admitted.   CSW will continue to follow for D/C needs.  Dorothe Pea. Raylinn Kosar  MSW, LCSW, LCAS, CSI Transitions of Care Clinical Social Worker Care Coordination Department Ph: [phone]

## 2020-04-24 MED ORDER — ZOLPIDEM TARTRATE 5 MG PO TABS
5.0000 mg | ORAL_TABLET | Freq: Every evening | ORAL | Status: DC | PRN
  Administered 2020-04-24 – 2020-04-29 (×5): 5 mg via ORAL
  Filled 2020-04-24 (×5): qty 1
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
## 2020-04-24 NOTE — Progress Notes (Signed)
Notified Dr Andrey Campanile that pt requesting a sleeping pill.RN will continue to monitor.

## 2020-04-25 MED ORDER — ENOXAPARIN SODIUM 40 MG/0.4ML ~~LOC~~ SOLN
40.0000 mg | Freq: Every day | SUBCUTANEOUS | Status: DC
  Administered 2020-04-27: 40 mg via SUBCUTANEOUS
  Filled 2020-04-25: qty 0.4
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
## 2020-04-26 ENCOUNTER — Inpatient Hospital Stay (HOSPITAL_COMMUNITY): Payer: Self-pay

## 2020-04-26 LAB — PROTIME-INR
INR: 1.2 (ref 0.8–1.2)
Prothrombin Time: 14.5 seconds (ref 11.4–15.2)

## 2020-04-26 LAB — APTT: aPTT: 34 seconds (ref 24–36)

## 2020-04-26 MED ORDER — FENTANYL CITRATE (PF) 100 MCG/2ML IJ SOLN
INTRAMUSCULAR | Status: AC
  Filled 2020-04-26: qty 4

## 2020-04-26 MED ORDER — HYDROMORPHONE HCL 1 MG/ML IJ SOLN: 0.5000 mg | INTRAMUSCULAR | Status: DC | PRN

## 2020-04-26 MED ORDER — ACETAMINOPHEN 500 MG PO TABS
1000.0000 mg | ORAL_TABLET | Freq: Three times a day (TID) | ORAL | Status: DC
  Administered 2020-04-26 – 2020-04-30 (×13): 1000 mg via ORAL
  Filled 2020-04-26 (×13): qty 2

## 2020-04-26 MED ORDER — MIDAZOLAM HCL 2 MG/2ML IJ SOLN
INTRAMUSCULAR | Status: AC | PRN
  Administered 2020-04-26 (×2): 1 mg via INTRAVENOUS

## 2020-04-26 MED ORDER — GABAPENTIN 600 MG PO TABS
300.0000 mg | ORAL_TABLET | Freq: Three times a day (TID) | ORAL | Status: DC
  Administered 2020-04-26 – 2020-04-27 (×5): 300 mg via ORAL
  Filled 2020-04-26 (×7): qty 0.5

## 2020-04-26 MED ORDER — METHOCARBAMOL 500 MG PO TABS
750.0000 mg | ORAL_TABLET | Freq: Four times a day (QID) | ORAL | Status: DC | PRN
  Administered 2020-04-27: 750 mg via ORAL
  Filled 2020-04-26: qty 2

## 2020-04-26 MED ORDER — FENTANYL CITRATE (PF) 100 MCG/2ML IJ SOLN
INTRAMUSCULAR | Status: AC
Start: 2020-04-26 — End: 2020-04-27
  Filled 2020-04-26: qty 2

## 2020-04-26 MED ORDER — MIDAZOLAM HCL 2 MG/2ML IJ SOLN
INTRAMUSCULAR | Status: AC
  Filled 2020-04-26: qty 4

## 2020-04-26 MED ORDER — OXYCODONE HCL 5 MG PO TABS
5.0000 mg | ORAL_TABLET | ORAL | Status: DC | PRN
  Administered 2020-04-26 – 2020-04-30 (×8): 10 mg via ORAL
  Filled 2020-04-26 (×8): qty 2

## 2020-04-26 MED ORDER — FENTANYL CITRATE (PF) 100 MCG/2ML IJ SOLN
INTRAMUSCULAR | Status: AC | PRN
  Administered 2020-04-26: 50 ug via INTRAVENOUS
  Administered 2020-04-26: 25 ug via INTRAVENOUS
  Administered 2020-04-26: 50 ug via INTRAVENOUS

## 2020-04-26 MED ORDER — LIDOCAINE HCL 1 % IJ SOLN
INTRAMUSCULAR | Status: AC
  Filled 2020-04-26: qty 20

## 2020-04-26 MED ORDER — POLYETHYLENE GLYCOL 3350 17 G PO PACK
17.0000 g | PACK | Freq: Every day | ORAL | Status: DC
  Administered 2020-04-27: 17 g via ORAL
  Filled 2020-04-26: qty 1

## 2020-04-26 NOTE — Procedures (Signed)
Interventional Radiology Procedure Note  Procedure: CT guided placement of right pleural drainage catheter  Complications: None  Estimated Blood Loss: < 10 mL  Findings: 12 Fr pigtail catheter placed in right pleural space via anterolateral intercostal space with initial return of old appearing bloody fluid followed by more frank bloody fluid. Sample sent for culture analysis.  Jodi Marble. Fredia Sorrow, M.D Pager:  [phone]
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
## 2020-04-26 NOTE — Plan of Care (Signed)
  Problem: Clinical Measurements: Goal: Ability to maintain clinical measurements within normal limits will improve Outcome: Progressing   Problem: Nutrition: Goal: Adequate nutrition will be maintained Outcome: Progressing   Problem: Coping: Goal: Level of anxiety will decrease Outcome: Progressing   Problem: Pain Managment: Goal: General experience of comfort will improve Outcome: Progressing   Problem: Safety: Goal: Ability to remain free from injury will improve Outcome: Progressing   Problem: Skin Integrity: Goal: Risk for impaired skin integrity will decrease Outcome: Progressing   

## 2020-04-27 ENCOUNTER — Inpatient Hospital Stay (HOSPITAL_COMMUNITY): Payer: Self-pay

## 2020-04-27 ENCOUNTER — Inpatient Hospital Stay (INDEPENDENT_AMBULATORY_CARE_PROVIDER_SITE_OTHER): Payer: Self-pay | Admitting: Primary Care

## 2020-04-27 LAB — CBC
HCT: 40.5 % (ref 39.0–52.0)
Hemoglobin: 13.1 g/dL (ref 13.0–17.0)
MCH: 29.3 pg (ref 26.0–34.0)
MCHC: 32.3 g/dL (ref 30.0–36.0)
MCV: 90.6 fL (ref 80.0–100.0)
Platelets: 767 10*3/uL — ABNORMAL HIGH (ref 150–400)
RBC: 4.47 MIL/uL (ref 4.22–5.81)
RDW: 13.3 % (ref 11.5–15.5)
WBC: 14.2 10*3/uL — ABNORMAL HIGH (ref 4.0–10.5)
nRBC: 0 % (ref 0.0–0.2)

## 2020-04-27 LAB — BASIC METABOLIC PANEL
Anion gap: 10 (ref 5–15)
BUN: 11 mg/dL (ref 6–20)
CO2: 25 mmol/L (ref 22–32)
Calcium: 8.8 mg/dL — ABNORMAL LOW (ref 8.9–10.3)
Chloride: 102 mmol/L (ref 98–111)
Creatinine, Ser: 0.87 mg/dL (ref 0.61–1.24)
GFR calc Af Amer: >60 mL/min (ref >60)
GFR calc non Af Amer: >60 mL/min (ref >60)
Glucose, Bld: 89 mg/dL (ref 70–99)
Potassium: 3.9 mmol/L (ref 3.5–5.1)
Sodium: 137 mmol/L (ref 135–145)

## 2020-04-27 MED ORDER — ENOXAPARIN SODIUM 80 MG/0.8ML ~~LOC~~ SOLN
80.0000 mg | Freq: Two times a day (BID) | SUBCUTANEOUS | Status: DC
  Administered 2020-04-27 – 2020-04-30 (×6): 80 mg via SUBCUTANEOUS
  Filled 2020-04-27 (×8): qty 0.8

## 2020-04-27 MED ORDER — METHOCARBAMOL 500 MG PO TABS
750.0000 mg | ORAL_TABLET | Freq: Three times a day (TID) | ORAL | Status: DC
  Administered 2020-04-27 – 2020-04-30 (×10): 750 mg via ORAL
  Filled 2020-04-27 (×10): qty 2
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
## 2020-04-27 NOTE — Progress Notes (Signed)
OT Cancellation Note  Patient Details Name: Daniel [name] MRN: [id number] DOB: 08-20-1990   Cancelled Treatment:    Reason Eval/Treat Not Completed: Other (comment) (Pt with PT. Will re-attempt OT session as time allows.)  Lorre Munroe 04/27/2020, 11:17 AM

## 2020-04-27 NOTE — Plan of Care (Addendum)
Ortho Tech notified for the wrist splint.   Problem: Clinical Measurements: Goal: Ability to maintain clinical measurements within normal limits will improve Outcome: Progressing   Problem: Activity: Goal: Risk for activity intolerance will decrease Outcome: Progressing   Problem: Nutrition: Goal: Adequate nutrition will be maintained Outcome: Progressing   Problem: Coping: Goal: Level of anxiety will decrease Outcome: Progressing   Problem: Elimination: Goal: Will not experience complications related to bowel motility Outcome: Progressing   Problem: Pain Managment: Goal: General experience of comfort will improve Outcome: Progressing   Problem: Safety: Goal: Ability to remain free from injury will improve Outcome: Progressing   Problem: Skin Integrity: Goal: Risk for impaired skin integrity will decrease Outcome: Progressing

## 2020-04-27 NOTE — Progress Notes (Signed)
Orthopedic Tech Progress Note Patient Details:  Daniel [name] Nov 14, 1990 [id number] Applied wrist brace to RUE. Ortho Devices Type of Ortho Device: Wrist splint Ortho Device/Splint Location: RUE Ortho Device/Splint Interventions: Ordered, Application, Adjustment   Post Interventions Patient Tolerated: Well Instructions Provided: Adjustment of device   Kerry Fort 04/27/2020, 5:16 PM
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
## 2020-04-28 ENCOUNTER — Inpatient Hospital Stay (HOSPITAL_COMMUNITY): Payer: Self-pay

## 2020-04-28 LAB — CBC
HCT: 37.9 % — ABNORMAL LOW (ref 39.0–52.0)
Hemoglobin: 12.2 g/dL — ABNORMAL LOW (ref 13.0–17.0)
MCH: 29 pg (ref 26.0–34.0)
MCHC: 32.2 g/dL (ref 30.0–36.0)
MCV: 90 fL (ref 80.0–100.0)
Platelets: 750 10*3/uL — ABNORMAL HIGH (ref 150–400)
RBC: 4.21 MIL/uL — ABNORMAL LOW (ref 4.22–5.81)
RDW: 13.2 % (ref 11.5–15.5)
WBC: 13.9 10*3/uL — ABNORMAL HIGH (ref 4.0–10.5)
nRBC: 0 % (ref 0.0–0.2)

## 2020-04-28 MED ORDER — GABAPENTIN 800 MG PO TABS
400.0000 mg | ORAL_TABLET | Freq: Three times a day (TID) | ORAL | Status: DC
  Filled 2020-04-28 (×2): qty 0.5

## 2020-04-28 MED ORDER — POLYETHYLENE GLYCOL 3350 17 G PO PACK
17.0000 g | PACK | Freq: Two times a day (BID) | ORAL | Status: DC
  Administered 2020-04-29 – 2020-04-30 (×2): 17 g via ORAL
  Filled 2020-04-28 (×5): qty 1

## 2020-04-28 MED ORDER — GABAPENTIN 400 MG PO CAPS
400.0000 mg | ORAL_CAPSULE | Freq: Three times a day (TID) | ORAL | Status: DC
  Administered 2020-04-28 – 2020-04-30 (×8): 400 mg via ORAL
  Filled 2020-04-28 (×8): qty 1

## 2020-04-28 NOTE — Progress Notes (Signed)
Phlebotomy will be coming to get labs this morning.  Please call [phone] with any questions.
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
## 2020-04-28 NOTE — Plan of Care (Signed)

## 2020-04-29 ENCOUNTER — Inpatient Hospital Stay (HOSPITAL_COMMUNITY): Payer: Self-pay

## 2020-04-29 LAB — CBC
HCT: 36.6 % — ABNORMAL LOW (ref 39.0–52.0)
Hemoglobin: 11.8 g/dL — ABNORMAL LOW (ref 13.0–17.0)
MCH: 29.1 pg (ref 26.0–34.0)
MCHC: 32.2 g/dL (ref 30.0–36.0)
MCV: 90.1 fL (ref 80.0–100.0)
Platelets: 695 10*3/uL — ABNORMAL HIGH (ref 150–400)
RBC: 4.06 MIL/uL — ABNORMAL LOW (ref 4.22–5.81)
RDW: 13.2 % (ref 11.5–15.5)
WBC: 12.9 10*3/uL — ABNORMAL HIGH (ref 4.0–10.5)
nRBC: 0 % (ref 0.0–0.2)

## 2020-04-29 MED ORDER — BISACODYL 10 MG RE SUPP: 10.0000 mg | Freq: Once | RECTAL | Status: DC

## 2020-04-29 MED ORDER — MAGNESIUM CITRATE PO SOLN
0.5000 | Freq: Once | ORAL | Status: AC
  Administered 2020-04-29: 0.5 via ORAL
  Filled 2020-04-29: qty 296
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
## 2020-04-29 NOTE — Plan of Care (Signed)
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
## 2020-04-29 NOTE — TOC CAGE-AID Note (Signed)
Transition of Care [facility]) - CAGE-AID Screening   Patient Details  Name: Daniel [name] MRN: [id number] Date of Birth: June 11, 1991  Transition of Care [facility]) CM/SW Contact:    Emeterio Reeve, [place] Phone Number: 04/29/2020, 3:59 PM   Clinical Narrative:  CSW met with pt at bedside. CSW introduced self and explained her role at the hospital.  Pt reports social alcohol use. Pt denied amp, benzo and cocaine use and endorsed occasional marijuana use. PT stated he did not have a problem with with substance use. Pt denied resources.   CAGE-AID Screening:    Have You Ever Felt You Ought to Cut Down on Your Drinking or Drug Use?: No Have People Annoyed You By Critizing Your Drinking Or Drug Use?: No   Have You Ever Had a Drink or Used Drugs First Thing In The Morning to Steady Your Nerves or to Get Rid of a Hangover?: No    Substance Abuse Education Offered: Yes  Substance abuse interventions: Patient Counseling  Emeterio Reeve, Latanya Presser, [place] Social Worker [phone]

## 2020-04-29 NOTE — Plan of Care (Signed)

## 2020-04-30 ENCOUNTER — Inpatient Hospital Stay (HOSPITAL_COMMUNITY): Payer: Self-pay

## 2020-04-30 LAB — CBC
HCT: 39.1 % (ref 39.0–52.0)
Hemoglobin: 12.4 g/dL — ABNORMAL LOW (ref 13.0–17.0)
MCH: 28.5 pg (ref 26.0–34.0)
MCHC: 31.7 g/dL (ref 30.0–36.0)
MCV: 89.9 fL (ref 80.0–100.0)
Platelets: 717 10*3/uL — ABNORMAL HIGH (ref 150–400)
RBC: 4.35 MIL/uL (ref 4.22–5.81)
RDW: 13.2 % (ref 11.5–15.5)
WBC: 12.3 10*3/uL — ABNORMAL HIGH (ref 4.0–10.5)
nRBC: 0 % (ref 0.0–0.2)

## 2020-04-30 LAB — BASIC METABOLIC PANEL
Anion gap: 11 (ref 5–15)
BUN: 9 mg/dL (ref 6–20)
CO2: 28 mmol/L (ref 22–32)
Calcium: 9.1 mg/dL (ref 8.9–10.3)
Chloride: 100 mmol/L (ref 98–111)
Creatinine, Ser: 0.86 mg/dL (ref 0.61–1.24)
GFR calc Af Amer: >60 mL/min (ref >60)
GFR calc non Af Amer: >60 mL/min (ref >60)
Glucose, Bld: 95 mg/dL (ref 70–99)
Potassium: 4.2 mmol/L (ref 3.5–5.1)
Sodium: 139 mmol/L (ref 135–145)

## 2020-04-30 MED ORDER — OXYCODONE HCL 10 MG PO TABS: 5.0000 mg | ORAL_TABLET | Freq: Four times a day (QID) | ORAL | 0 refills | Status: DC | PRN

## 2020-04-30 MED FILL — oxyCODONE HCL 10 MG TABS: 10 | 5 days supply | Qty: 20 | Fill #0
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
## 2020-04-30 NOTE — TOC Transition Note (Signed)
Transition of Care [facility]) - CM/SW Discharge Note   Patient Details  Name: Daniel [name] MRN: [id number] Date of Birth: 08-16-1990  Transition of Care [facility]) CM/SW Contact:  Glennon Mac, RN Phone Number: 04/30/2020, 2:34 PM   Clinical Narrative:   Patient medically stable for discharge home today.  Patient eligible for medication assistance through [facility] health match program; discharge prescriptions sent to [facility] pharmacy to be filled using match letter.  Patient active with [facility] for outpatient therapy prior to admission; to resume outpatient therapies as prior to admission.  Will arrange hospital follow-up appointment at [facility], as patient missed PCP appointment while in hospital.    Final next level of care: OP Rehab Barriers to Discharge: Barriers Resolved   Patient Goals and CMS Choice Patient states their goals for this hospitalization and ongoing recovery are:: to return home CMS Medicare.gov Compare Post Acute Care list provided to:: Patient Choice offered to / list presented to : Patient  Discharge Planning Services: MATCH Program, Medication Assistance, Follow-up appt scheduled, [facility]                                 Social Determinants of Health (SDOH) Interventions     Readmission Risk Interventions Readmission Risk Prevention Plan 04/30/2020  Transportation Screening Complete  PCP or Specialist Appt within 3-5 Days Complete  HRI or Home Care Consult Not Complete  HRI or Home Care Consult comments Referral made for OP therapy as recommended  Social Work Consult for Recovery Care Planning/Counseling Patient refused  Palliative Care Screening Not Applicable  Medication Review [employer]) Complete  Some recent data might be hidden   Quintella Baton, RN, BSN  Trauma/Neuro ICU Case Manager [phone]

## 2020-04-30 NOTE — Plan of Care (Signed)
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
## 2020-04-30 NOTE — Plan of Care (Signed)

## 2020-04-30 NOTE — Progress Notes (Signed)
Provided discharge education/instructions, all questions and concerns addressed, Pt not in distress, discharged home with belongings accompanied by sister.

## 2020-05-01 LAB — AEROBIC/ANAEROBIC CULTURE W GRAM STAIN (SURGICAL/DEEP WOUND)
Culture: NO GROWTH
Special Requests: NORMAL

## 2020-05-03 ENCOUNTER — Other Ambulatory Visit: Payer: Self-pay

## 2020-05-03 ENCOUNTER — Ambulatory Visit (HOSPITAL_COMMUNITY)
Admission: EM | Admit: 2020-05-03 | Discharge: 2020-05-04 | Disposition: A | Discharge status: Other Acute Inpatient Hospital | Payer: No Payment, Other | Attending: Psychiatry | Admitting: Psychiatry

## 2020-05-03 DIAGNOSIS — R45851 Suicidal ideations: Secondary | ICD-10-CM | POA: Diagnosis not present

## 2020-05-03 DIAGNOSIS — F319 Bipolar disorder, unspecified: Secondary | ICD-10-CM | POA: Diagnosis not present

## 2020-05-03 DIAGNOSIS — Z20822 Contact with and (suspected) exposure to covid-19: Secondary | ICD-10-CM | POA: Diagnosis not present

## 2020-05-03 DIAGNOSIS — F17210 Nicotine dependence, cigarettes, uncomplicated: Secondary | ICD-10-CM | POA: Insufficient documentation

## 2020-05-03 DIAGNOSIS — R52 Pain, unspecified: Secondary | ICD-10-CM | POA: Insufficient documentation

## 2020-05-03 DIAGNOSIS — F4323 Adjustment disorder with mixed anxiety and depressed mood: Secondary | ICD-10-CM | POA: Diagnosis not present

## 2020-05-03 DIAGNOSIS — R7309 Other abnormal glucose: Secondary | ICD-10-CM | POA: Insufficient documentation

## 2020-05-03 DIAGNOSIS — G4730 Sleep apnea, unspecified: Secondary | ICD-10-CM | POA: Insufficient documentation

## 2020-05-03 DIAGNOSIS — Z79899 Other long term (current) drug therapy: Secondary | ICD-10-CM | POA: Insufficient documentation

## 2020-05-03 LAB — POCT URINE DRUG SCREEN - MANUAL ENTRY (I-SCREEN)
POC Amphetamine UR: NOT DETECTED
POC Buprenorphine (BUP): NOT DETECTED
POC Cocaine UR: POSITIVE — AB
POC Marijuana UR: POSITIVE — AB
POC Methadone UR: NOT DETECTED
POC Methamphetamine UR: NOT DETECTED
POC Morphine: NOT DETECTED
POC Oxazepam (BZO): NOT DETECTED
POC Oxycodone UR: POSITIVE — AB
POC Secobarbital (BAR): NOT DETECTED

## 2020-05-03 LAB — COMPREHENSIVE METABOLIC PANEL
ALT: 21 U/L (ref 0–44)
AST: 21 U/L (ref 15–41)
Albumin: 2.9 g/dL — ABNORMAL LOW (ref 3.5–5.0)
Alkaline Phosphatase: 91 U/L (ref 38–126)
Anion gap: 10 (ref 5–15)
BUN: 9 mg/dL (ref 6–20)
CO2: 27 mmol/L (ref 22–32)
Calcium: 8.9 mg/dL (ref 8.9–10.3)
Chloride: 101 mmol/L (ref 98–111)
Creatinine, Ser: 0.94 mg/dL (ref 0.61–1.24)
GFR calc Af Amer: >60 mL/min (ref >60)
GFR calc non Af Amer: >60 mL/min (ref >60)
Glucose, Bld: 87 mg/dL (ref 70–99)
Potassium: 4 mmol/L (ref 3.5–5.1)
Sodium: 138 mmol/L (ref 135–145)
Total Bilirubin: 1 mg/dL (ref 0.3–1.2)
Total Protein: 7 g/dL (ref 6.5–8.1)

## 2020-05-03 LAB — CBC WITH DIFFERENTIAL/PLATELET
Abs Immature Granulocytes: 0.06 10*3/uL (ref 0.00–0.07)
Basophils Absolute: 0.1 10*3/uL (ref 0.0–0.1)
Basophils Relative: 1 %
Eosinophils Absolute: 0.4 10*3/uL (ref 0.0–0.5)
Eosinophils Relative: 3 %
HCT: 39.7 % (ref 39.0–52.0)
Hemoglobin: 12.4 g/dL — ABNORMAL LOW (ref 13.0–17.0)
Immature Granulocytes: 1 %
Lymphocytes Relative: 15 %
Lymphs Abs: 1.8 10*3/uL (ref 0.7–4.0)
MCH: 28.9 pg (ref 26.0–34.0)
MCHC: 31.2 g/dL (ref 30.0–36.0)
MCV: 92.5 fL (ref 80.0–100.0)
Monocytes Absolute: 1.1 10*3/uL — ABNORMAL HIGH (ref 0.1–1.0)
Monocytes Relative: 9 %
Neutro Abs: 9.1 10*3/uL — ABNORMAL HIGH (ref 1.7–7.7)
Neutrophils Relative %: 71 %
Platelets: 613 10*3/uL — ABNORMAL HIGH (ref 150–400)
RBC: 4.29 MIL/uL (ref 4.22–5.81)
RDW: 13.5 % (ref 11.5–15.5)
WBC: 12.6 10*3/uL — ABNORMAL HIGH (ref 4.0–10.5)
nRBC: 0 % (ref 0.0–0.2)

## 2020-05-03 LAB — ETHANOL: Alcohol, Ethyl (B): <10 mg/dL (ref <10)

## 2020-05-03 LAB — TSH: TSH: 0.419 u[IU]/mL (ref 0.350–4.500)

## 2020-05-03 LAB — POC SARS CORONAVIRUS 2 AG -  ED: SARS Coronavirus 2 Ag: NEGATIVE

## 2020-05-03 LAB — SARS CORONAVIRUS 2 BY RT PCR (HOSPITAL ORDER, PERFORMED IN ~~LOC~~ HOSPITAL LAB): SARS Coronavirus 2: NEGATIVE

## 2020-05-03 MED ORDER — ALUM & MAG HYDROXIDE-SIMETH 200-200-20 MG/5ML PO SUSP: 30.0000 mL | ORAL | Status: DC | PRN

## 2020-05-03 MED ORDER — MAGNESIUM HYDROXIDE 400 MG/5ML PO SUSP: 30.0000 mL | Freq: Every day | ORAL | Status: DC | PRN

## 2020-05-03 MED ORDER — DOXYCYCLINE HYCLATE 100 MG PO TABS
100.0000 mg | ORAL_TABLET | Freq: Two times a day (BID) | ORAL | Status: DC
  Administered 2020-05-03 – 2020-05-04 (×2): 100 mg via ORAL
  Filled 2020-05-03 (×2): qty 1

## 2020-05-03 MED ORDER — ACETAMINOPHEN 325 MG PO TABS
650.0000 mg | ORAL_TABLET | Freq: Four times a day (QID) | ORAL | Status: DC | PRN
  Administered 2020-05-03 – 2020-05-04 (×2): 650 mg via ORAL
  Filled 2020-05-03 (×2): qty 2

## 2020-05-03 MED ORDER — QUETIAPINE FUMARATE 200 MG PO TABS
200.0000 mg | ORAL_TABLET | Freq: Every day | ORAL | Status: DC
  Administered 2020-05-03: 200 mg via ORAL
  Filled 2020-05-03: qty 1

## 2020-05-03 MED ORDER — OXYCODONE-ACETAMINOPHEN 5-325 MG PO TABS
2.0000 | ORAL_TABLET | Freq: Once | ORAL | Status: AC
  Administered 2020-05-03: 2 via ORAL
  Filled 2020-05-03: qty 2

## 2020-05-03 MED ORDER — HYDROXYZINE HCL 25 MG PO TABS
25.0000 mg | ORAL_TABLET | Freq: Three times a day (TID) | ORAL | Status: DC | PRN
  Administered 2020-05-03: 25 mg via ORAL
  Filled 2020-05-03: qty 1

## 2020-05-03 MED ORDER — OXYCODONE-ACETAMINOPHEN 5-325 MG PO TABS
1.0000 | ORAL_TABLET | Freq: Three times a day (TID) | ORAL | Status: DC | PRN
  Administered 2020-05-04: 1 via ORAL
  Filled 2020-05-03: qty 1

## 2020-05-03 MED ORDER — GABAPENTIN 300 MG PO CAPS
300.0000 mg | ORAL_CAPSULE | Freq: Three times a day (TID) | ORAL | Status: DC
  Administered 2020-05-03 – 2020-05-04 (×4): 300 mg via ORAL
  Filled 2020-05-03 (×4): qty 1

## 2020-05-03 NOTE — ED Notes (Signed)
Patient belongings in locker # 25.  
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
## 2020-05-03 NOTE — ED Notes (Signed)
Patient reported having severe pain, Patient cooperative, Patient given meal and extra balnkets

## 2020-05-03 NOTE — ED Notes (Signed)
Pt continue to complain of pain to right arm. Pt stated, "The Neurontin helped a little but I think it need to be a little stronger because it only knock off the edge a little, like from a 10 to a 8. Right now, it is a 9. NP received a secure chat from RN for pt's recommendation for increase in medication. Safety maintained.

## 2020-05-03 NOTE — ED Notes (Signed)
Performed another skin assessment of surgical sites along with [facility], NP. Observed scant amount of purulent drainage to staple site along inner right arm, tender to touch, odorous and warm to touch. Drsg to be applied, ABT ordered.

## 2020-05-03 NOTE — ED Notes (Addendum)
Patient arrived on unit.  Patient cooperative and resting
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
## 2020-05-03 NOTE — ED Notes (Signed)
Writer called pt's sister, Ave Filter, per pt request [phone]) to inform her of pt's whereabouts and status. Sister appreciative of report. Pt appreciative of staff assistance. Safety maintained.
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
## 2020-05-04 ENCOUNTER — Other Ambulatory Visit: Payer: Self-pay

## 2020-05-04 ENCOUNTER — Inpatient Hospital Stay (HOSPITAL_COMMUNITY)
Admission: AD | Admit: 2020-05-04 | Discharge: 2020-05-08 | DRG: 885 | Disposition: A | Discharge status: Home / Self Care | Payer: Federal, State, Local not specified - Other | Source: Intra-hospital | Attending: Psychiatry | Admitting: Psychiatry

## 2020-05-04 ENCOUNTER — Encounter (HOSPITAL_COMMUNITY): Payer: Self-pay | Admitting: Psychiatry

## 2020-05-04 DIAGNOSIS — F322 Major depressive disorder, single episode, severe without psychotic features: Secondary | ICD-10-CM | POA: Diagnosis present

## 2020-05-04 DIAGNOSIS — Z7901 Long term (current) use of anticoagulants: Secondary | ICD-10-CM

## 2020-05-04 DIAGNOSIS — Z79899 Other long term (current) drug therapy: Secondary | ICD-10-CM | POA: Diagnosis not present

## 2020-05-04 DIAGNOSIS — R45851 Suicidal ideations: Secondary | ICD-10-CM | POA: Diagnosis present

## 2020-05-04 DIAGNOSIS — Z20822 Contact with and (suspected) exposure to covid-19: Secondary | ICD-10-CM | POA: Diagnosis present

## 2020-05-04 DIAGNOSIS — K5900 Constipation, unspecified: Secondary | ICD-10-CM | POA: Diagnosis present

## 2020-05-04 DIAGNOSIS — Z23 Encounter for immunization: Secondary | ICD-10-CM | POA: Diagnosis not present

## 2020-05-04 DIAGNOSIS — E119 Type 2 diabetes mellitus without complications: Secondary | ICD-10-CM | POA: Diagnosis present

## 2020-05-04 DIAGNOSIS — J942 Hemothorax: Secondary | ICD-10-CM | POA: Diagnosis present

## 2020-05-04 DIAGNOSIS — G4700 Insomnia, unspecified: Secondary | ICD-10-CM | POA: Diagnosis present

## 2020-05-04 DIAGNOSIS — K219 Gastro-esophageal reflux disease without esophagitis: Secondary | ICD-10-CM | POA: Diagnosis present

## 2020-05-04 DIAGNOSIS — Z813 Family history of other psychoactive substance abuse and dependence: Secondary | ICD-10-CM | POA: Diagnosis not present

## 2020-05-04 DIAGNOSIS — F4321 Adjustment disorder with depressed mood: Secondary | ICD-10-CM | POA: Diagnosis present

## 2020-05-04 DIAGNOSIS — W3400XA Accidental discharge from unspecified firearms or gun, initial encounter: Secondary | ICD-10-CM

## 2020-05-04 DIAGNOSIS — F4310 Post-traumatic stress disorder, unspecified: Secondary | ICD-10-CM | POA: Diagnosis present

## 2020-05-04 DIAGNOSIS — F1220 Cannabis dependence, uncomplicated: Secondary | ICD-10-CM | POA: Diagnosis present

## 2020-05-04 DIAGNOSIS — Z7984 Long term (current) use of oral hypoglycemic drugs: Secondary | ICD-10-CM | POA: Diagnosis not present

## 2020-05-04 DIAGNOSIS — F1414 Cocaine abuse with cocaine-induced mood disorder: Secondary | ICD-10-CM | POA: Diagnosis present

## 2020-05-04 DIAGNOSIS — F1994 Other psychoactive substance use, unspecified with psychoactive substance-induced mood disorder: Secondary | ICD-10-CM | POA: Diagnosis present

## 2020-05-04 DIAGNOSIS — F17210 Nicotine dependence, cigarettes, uncomplicated: Secondary | ICD-10-CM | POA: Diagnosis present

## 2020-05-04 LAB — HEMOGLOBIN A1C
Hgb A1c MFr Bld: 12 % — ABNORMAL HIGH (ref 4.8–5.6)
Mean Plasma Glucose: 298 mg/dL

## 2020-05-04 LAB — GLUCOSE, CAPILLARY
Glucose-Capillary: 95 mg/dL (ref 70–99)
Glucose-Capillary: 106 mg/dL — ABNORMAL HIGH (ref 70–99)

## 2020-05-04 MED ORDER — METHOCARBAMOL 500 MG PO TABS
1000.0000 mg | ORAL_TABLET | Freq: Three times a day (TID) | ORAL | Status: DC | PRN
  Administered 2020-05-04 – 2020-05-06 (×2): 1000 mg via ORAL
  Filled 2020-05-04 (×2): qty 2

## 2020-05-04 MED ORDER — INSULIN ASPART 100 UNIT/ML ~~LOC~~ SOLN: 0.0000 [IU] | Freq: Three times a day (TID) | SUBCUTANEOUS | Status: DC

## 2020-05-04 MED ORDER — IBUPROFEN 800 MG PO TABS
800.0000 mg | ORAL_TABLET | Freq: Three times a day (TID) | ORAL | Status: DC | PRN
  Administered 2020-05-06 – 2020-05-08 (×3): 800 mg via ORAL
  Filled 2020-05-04 (×3): qty 1

## 2020-05-04 MED ORDER — TRAZODONE HCL 50 MG PO TABS
50.0000 mg | ORAL_TABLET | Freq: Every evening | ORAL | Status: DC | PRN
  Administered 2020-05-04 – 2020-05-07 (×4): 50 mg via ORAL
  Filled 2020-05-04 (×2): qty 1
  Filled 2020-05-04: qty 7
  Filled 2020-05-04 (×2): qty 1

## 2020-05-04 MED ORDER — PNEUMOCOCCAL VAC POLYVALENT 25 MCG/0.5ML IJ INJ
0.5000 mL | INJECTION | INTRAMUSCULAR | Status: DC
  Filled 2020-05-04: qty 0.5

## 2020-05-04 MED ORDER — POLYETHYLENE GLYCOL 3350 17 G PO PACK: 17.0000 g | PACK | Freq: Every day | ORAL | Status: DC | PRN

## 2020-05-04 MED ORDER — QUETIAPINE FUMARATE 200 MG PO TABS
200.0000 mg | ORAL_TABLET | Freq: Every day | ORAL | Status: DC
  Administered 2020-05-04 – 2020-05-07 (×4): 200 mg via ORAL
  Filled 2020-05-04 (×4): qty 1
  Filled 2020-05-04: qty 7
  Filled 2020-05-04 (×2): qty 1
  Filled 2020-05-04: qty 7

## 2020-05-04 MED ORDER — APIXABAN 5 MG PO TABS: 5.0000 mg | ORAL_TABLET | Freq: Two times a day (BID) | ORAL | Status: DC

## 2020-05-04 MED ORDER — DULOXETINE HCL 30 MG PO CPEP
30.0000 mg | ORAL_CAPSULE | Freq: Every day | ORAL | Status: DC
  Administered 2020-05-04 – 2020-05-07 (×4): 30 mg via ORAL
  Filled 2020-05-04 (×6): qty 1

## 2020-05-04 MED ORDER — TRAMADOL HCL 50 MG PO TABS: 50.0000 mg | ORAL_TABLET | Freq: Four times a day (QID) | ORAL | Status: DC | PRN

## 2020-05-04 MED ORDER — GABAPENTIN 400 MG PO CAPS
400.0000 mg | ORAL_CAPSULE | Freq: Three times a day (TID) | ORAL | Status: DC
  Administered 2020-05-04 – 2020-05-08 (×12): 400 mg via ORAL
  Filled 2020-05-04 (×3): qty 1
  Filled 2020-05-04 (×2): qty 21
  Filled 2020-05-04: qty 1
  Filled 2020-05-04: qty 21
  Filled 2020-05-04 (×3): qty 1
  Filled 2020-05-04: qty 21
  Filled 2020-05-04: qty 1
  Filled 2020-05-04: qty 21
  Filled 2020-05-04 (×4): qty 1
  Filled 2020-05-04: qty 21
  Filled 2020-05-04: qty 1

## 2020-05-04 MED ORDER — ENSURE ENLIVE PO LIQD
237.0000 mL | Freq: Two times a day (BID) | ORAL | Status: DC
  Administered 2020-05-05 – 2020-05-08 (×8): 237 mL via ORAL

## 2020-05-04 MED ORDER — CHOLECALCIFEROL 10 MCG (400 UNIT) PO TABS
400.0000 [IU] | ORAL_TABLET | Freq: Every day | ORAL | Status: DC
  Administered 2020-05-04 – 2020-05-08 (×5): 400 [IU] via ORAL
  Filled 2020-05-04: qty 7
  Filled 2020-05-04 (×5): qty 1
  Filled 2020-05-04: qty 7

## 2020-05-04 MED ORDER — DOCUSATE SODIUM 100 MG PO CAPS
100.0000 mg | ORAL_CAPSULE | Freq: Every day | ORAL | Status: DC | PRN
  Filled 2020-05-04: qty 3

## 2020-05-04 MED ORDER — ONDANSETRON 4 MG PO TBDP: 4.0000 mg | ORAL_TABLET | Freq: Three times a day (TID) | ORAL | Status: DC | PRN

## 2020-05-04 MED ORDER — TRAMADOL HCL 50 MG PO TABS
100.0000 mg | ORAL_TABLET | Freq: Four times a day (QID) | ORAL | Status: DC | PRN
  Administered 2020-05-04 – 2020-05-08 (×9): 100 mg via ORAL
  Filled 2020-05-04 (×9): qty 2

## 2020-05-04 MED ORDER — MAGNESIUM HYDROXIDE 400 MG/5ML PO SUSP: 30.0000 mL | Freq: Every day | ORAL | Status: DC | PRN

## 2020-05-04 MED ORDER — HYDROXYZINE HCL 25 MG PO TABS
25.0000 mg | ORAL_TABLET | Freq: Three times a day (TID) | ORAL | Status: DC | PRN
  Administered 2020-05-04 – 2020-05-07 (×4): 25 mg via ORAL
  Filled 2020-05-04 (×4): qty 1

## 2020-05-04 MED ORDER — ALUM & MAG HYDROXIDE-SIMETH 200-200-20 MG/5ML PO SUSP: 30.0000 mL | ORAL | Status: DC | PRN

## 2020-05-04 MED ORDER — BACITRACIN-NEOMYCIN-POLYMYXIN OINTMENT TUBE
TOPICAL_OINTMENT | Freq: Two times a day (BID) | CUTANEOUS | Status: DC
  Administered 2020-05-06: 1 via TOPICAL
  Filled 2020-05-04: qty 14.17

## 2020-05-04 MED ORDER — NICOTINE POLACRILEX 2 MG MT GUM
2.0000 mg | CHEWING_GUM | OROMUCOSAL | Status: DC | PRN
  Administered 2020-05-05 – 2020-05-06 (×2): 2 mg via ORAL
  Filled 2020-05-04: qty 1

## 2020-05-04 MED ORDER — ASCORBIC ACID 500 MG PO TABS
1000.0000 mg | ORAL_TABLET | Freq: Every day | ORAL | Status: DC
  Administered 2020-05-04 – 2020-05-08 (×5): 1000 mg via ORAL
  Filled 2020-05-04 (×2): qty 2
  Filled 2020-05-04: qty 14
  Filled 2020-05-04 (×3): qty 2
  Filled 2020-05-04: qty 14

## 2020-05-04 MED ORDER — PANTOPRAZOLE SODIUM 40 MG PO TBEC
40.0000 mg | DELAYED_RELEASE_TABLET | Freq: Every day | ORAL | Status: DC
  Filled 2020-05-04 (×2): qty 1
  Filled 2020-05-04: qty 7
  Filled 2020-05-04 (×4): qty 1
  Filled 2020-05-04: qty 7

## 2020-05-04 MED ORDER — GABAPENTIN 300 MG PO CAPS
300.0000 mg | ORAL_CAPSULE | Freq: Three times a day (TID) | ORAL | Status: DC
  Filled 2020-05-04 (×6): qty 1

## 2020-05-04 MED ORDER — METFORMIN HCL 500 MG PO TABS
500.0000 mg | ORAL_TABLET | Freq: Every day | ORAL | Status: DC
  Filled 2020-05-04 (×2): qty 1

## 2020-05-04 MED ORDER — ACETAMINOPHEN 325 MG PO TABS: 650.0000 mg | ORAL_TABLET | Freq: Four times a day (QID) | ORAL | Status: DC | PRN
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
## 2020-05-04 NOTE — ED Notes (Signed)
Safe transportation called and report to Levy Sjogren RN

## 2020-05-04 NOTE — Progress Notes (Signed)
Recreation Therapy Notes  Animal-Assisted Activity (AAA) Program Checklist/Progress Notes Patient Eligibility Criteria Checklist & Daily Group note for Rec Tx Intervention  Date: 9.28.21 Time: 1430 Location: [address]   AAA/T Program Assumption of Risk Form signed by Engineer, production or Parent Legal Guardian  YES   Patient is free of allergies or sever asthma  YES   Patient reports no fear of animals  YES   Patient reports no history of cruelty to animals YES  Patient understands his/her participation is voluntary YES  Patient washes hands before animal contact  YES   Patient washes hands after animal contact  YES   Education: Charity fundraiser, Appropriate Animal Interaction   Education Outcome: Acknowledges understanding/In group clarification offered/Needs additional education.   Clinical Observations/Feedback: Pt did not attend group activity.    Caroll Rancher, LRT/CTRS         Caroll Rancher A 05/04/2020 3:40 PM

## 2020-05-04 NOTE — ED Notes (Signed)
Breakfast given.  
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
## 2020-05-04 NOTE — ED Notes (Signed)
Lunch given.
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
## 2020-05-04 NOTE — ED Notes (Signed)
Sutures and stitches removed from RUE incisions. Areas cleansed and steristrips/ dry dressing applied to incision on inner arm. Pt tolerated well.

## 2020-05-04 NOTE — ED Notes (Signed)
Patient alert and oriented. Patient voices SI thoughts. Patient is able to contract for safety on unit. Patient denies A/V/H. Patient given support and encouragement. Monitoring continues and patient remains safe on unit.

## 2020-05-04 NOTE — ED Notes (Signed)
Pt resting in bed through night with with eyes closed unlabored respirations. Safety maintained

## 2020-05-04 NOTE — ED Notes (Signed)
Pt A&Ox4, calm & cooperative. Pt reports no longer feeling SI/HI, states appreciation for the support & encouragement offered since being at [facility]. Denies AVH. Staff assisting as needed with ADLs. Pt ambulating without issue. Safety maintained.

## 2020-05-04 NOTE — Discharge Instructions (Addendum)
Discharge to [facility]

## 2020-05-05 DIAGNOSIS — F4310 Post-traumatic stress disorder, unspecified: Secondary | ICD-10-CM

## 2020-05-05 DIAGNOSIS — F1994 Other psychoactive substance use, unspecified with psychoactive substance-induced mood disorder: Secondary | ICD-10-CM

## 2020-05-05 DIAGNOSIS — F1414 Cocaine abuse with cocaine-induced mood disorder: Secondary | ICD-10-CM

## 2020-05-05 DIAGNOSIS — W3400XA Accidental discharge from unspecified firearms or gun, initial encounter: Secondary | ICD-10-CM

## 2020-05-05 DIAGNOSIS — F1220 Cannabis dependence, uncomplicated: Secondary | ICD-10-CM

## 2020-05-05 DIAGNOSIS — F322 Major depressive disorder, single episode, severe without psychotic features: Principal | ICD-10-CM

## 2020-05-05 LAB — GLUCOSE, CAPILLARY
Glucose-Capillary: 78 mg/dL (ref 70–99)
Glucose-Capillary: 84 mg/dL (ref 70–99)
Glucose-Capillary: 89 mg/dL (ref 70–99)

## 2020-05-05 MED ORDER — METFORMIN HCL 500 MG PO TABS
500.0000 mg | ORAL_TABLET | Freq: Two times a day (BID) | ORAL | Status: DC
  Administered 2020-05-08: 500 mg via ORAL
  Filled 2020-05-05 (×10): qty 1
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
## 2020-05-05 NOTE — Tx Team (Signed)
Interdisciplinary Treatment and Diagnostic Plan Update  05/05/2020 Time of Session: 9:10am Daniel [name] MRN: [id number]  Principal Diagnosis: <principal problem not specified>  Secondary Diagnoses: Active Problems:   Major depressive disorder, severe (HCC)   Current Medications:  Current Facility-Administered Medications  Medication Dose Route Frequency Provider Last Rate Last Admin   alum & mag hydroxide-simeth (MAALOX/MYLANTA) 200-200-20 MG/5ML suspension 30 mL  30 mL Oral Q4H PRN Antonieta Pert, MD       ascorbic acid (VITAMIN C) tablet 1,000 mg  1,000 mg Oral Daily Antonieta Pert, MD   1,000 mg at 05/05/20 6433   cholecalciferol (VITAMIN D3) tablet 400 Units  400 Units Oral Daily Antonieta Pert, MD   400 Units at 05/05/20 0816   docusate sodium (COLACE) capsule 100 mg  100 mg Oral Daily PRN Antonieta Pert, MD       DULoxetine (CYMBALTA) DR capsule 30 mg  30 mg Oral Daily Antonieta Pert, MD   30 mg at 05/05/20 2951   feeding supplement (ENSURE ENLIVE) (ENSURE ENLIVE) liquid 237 mL  237 mL Oral BID BM Dagar, Geralynn Rile, MD       gabapentin (NEURONTIN) capsule 400 mg  400 mg Oral TID Antonieta Pert, MD   400 mg at 05/05/20 0817   hydrOXYzine (ATARAX/VISTARIL) tablet 25 mg  25 mg Oral TID PRN Antonieta Pert, MD   25 mg at 05/04/20 2153   ibuprofen (ADVIL) tablet 800 mg  800 mg Oral Q8H PRN Aldean Baker, NP       insulin aspart (novoLOG) injection 0-9 Units  0-9 Units Subcutaneous TID WC Antonieta Pert, MD       magnesium hydroxide (MILK OF MAGNESIA) suspension 30 mL  30 mL Oral Daily PRN Antonieta Pert, MD       metFORMIN (GLUCOPHAGE) tablet 500 mg  500 mg Oral Q breakfast Antonieta Pert, MD       methocarbamol (ROBAXIN) tablet 1,000 mg  1,000 mg Oral Q8H PRN Antonieta Pert, MD   1,000 mg at 05/04/20 2154   neomycin-bacitracin-polymyxin (NEOSPORIN) ointment   Topical BID Antonieta Pert, MD   Given at 05/04/20 1654   nicotine polacrilex  (NICORETTE) gum 2 mg  2 mg Oral PRN Dagar, Geralynn Rile, MD       ondansetron (ZOFRAN-ODT) disintegrating tablet 4 mg  4 mg Oral Q8H PRN Antonieta Pert, MD       pantoprazole (PROTONIX) EC tablet 40 mg  40 mg Oral Daily Antonieta Pert, MD       pneumococcal 23 valent vaccine (PNEUMOVAX-23) injection 0.5 mL  0.5 mL Intramuscular Tomorrow-1000 Antonieta Pert, MD       polyethylene glycol (MIRALAX / GLYCOLAX) packet 17 g  17 g Oral Daily PRN Antonieta Pert, MD       QUEtiapine (SEROQUEL) tablet 200 mg  200 mg Oral QHS Antonieta Pert, MD   200 mg at 05/04/20 2153   traMADol (ULTRAM) tablet 100 mg  100 mg Oral Q6H PRN Antonieta Pert, MD   100 mg at 05/05/20 0105   traZODone (DESYREL) tablet 50 mg  50 mg Oral QHS PRN Antonieta Pert, MD   50 mg at 05/04/20 2153   PTA Medications: Medications Prior to Admission  Medication Sig Dispense Refill Last Dose   acetaminophen (TYLENOL) 500 MG tablet Take 1,000 mg by mouth every 8 (eight) hours as needed for mild pain.      apixaban (  ELIQUIS) 5 MG TABS tablet Take 5 mg by mouth 2 (two) times daily.      ascorbic acid (VITAMIN C) 1000 MG tablet Take 1,000 mg by mouth daily.      gabapentin (NEURONTIN) 300 MG capsule Take 300 mg by mouth 3 (three) times daily.      methocarbamol (ROBAXIN) 500 MG tablet Take 1,000 mg by mouth every 8 (eight) hours as needed for muscle spasms.      Oxycodone HCl 10 MG TABS Take 5-10 mg by mouth every 6 (six) hours as needed (moderate to severe pain).       Patient Stressors:    Patient Strengths:    Treatment Modalities: Medication Management, Group therapy, Case management,  1 to 1 session with clinician, Psychoeducation, Recreational therapy.   Physician Treatment Plan for Primary Diagnosis: <principal problem not specified> Long Term Goal(s): Improvement in symptoms so as ready for discharge Improvement in symptoms so as ready for discharge   Short Term Goals: Ability to identify changes in  lifestyle to reduce recurrence of condition will improve Ability to verbalize feelings will improve Compliance with prescribed medications will improve Ability to identify changes in lifestyle to reduce recurrence of condition will improve Ability to demonstrate self-control will improve Ability to identify and develop effective coping behaviors will improve Ability to maintain clinical measurements within normal limits will improve Ability to identify triggers associated with substance abuse/mental health issues will improve  Medication Management: Evaluate patient's response, side effects, and tolerance of medication regimen.  Therapeutic Interventions: 1 to 1 sessions, Unit Group sessions and Medication administration.  Evaluation of Outcomes: Progressing  Physician Treatment Plan for Secondary Diagnosis: Active Problems:   Major depressive disorder, severe (HCC)  Long Term Goal(s): Improvement in symptoms so as ready for discharge Improvement in symptoms so as ready for discharge   Short Term Goals: Ability to identify changes in lifestyle to reduce recurrence of condition will improve Ability to verbalize feelings will improve Compliance with prescribed medications will improve Ability to identify changes in lifestyle to reduce recurrence of condition will improve Ability to demonstrate self-control will improve Ability to identify and develop effective coping behaviors will improve Ability to maintain clinical measurements within normal limits will improve Ability to identify triggers associated with substance abuse/mental health issues will improve     Medication Management: Evaluate patient's response, side effects, and tolerance of medication regimen.  Therapeutic Interventions: 1 to 1 sessions, Unit Group sessions and Medication administration.  Evaluation of Outcomes: Progressing   RN Treatment Plan for Primary Diagnosis: <principal problem not specified> Long Term  Goal(s): Knowledge of disease and therapeutic regimen to maintain health will improve  Short Term Goals: Ability to remain free from injury will improve, Ability to demonstrate self-control, Ability to participate in decision making will improve, Ability to verbalize feelings will improve, Ability to disclose and discuss suicidal ideas and Ability to identify and develop effective coping behaviors will improve  Medication Management: RN will administer medications as ordered by provider, will assess and evaluate patient's response and provide education to patient for prescribed medication. RN will report any adverse and/or side effects to prescribing provider.  Therapeutic Interventions: 1 on 1 counseling sessions, Psychoeducation, Medication administration, Evaluate responses to treatment, Monitor vital signs and CBGs as ordered, Perform/monitor CIWA, COWS, AIMS and Fall Risk screenings as ordered, Perform wound care treatments as ordered.  Evaluation of Outcomes: Progressing   LCSW Treatment Plan for Primary Diagnosis: <principal problem not specified> Long Term Goal(s): Safe transition  to appropriate next level of care at discharge, Engage patient in therapeutic group addressing interpersonal concerns.  Short Term Goals: Engage patient in aftercare planning with referrals and resources, Increase social support, Increase emotional regulation, Facilitate acceptance of mental health diagnosis and concerns, Identify triggers associated with mental health/substance abuse issues and Increase skills for wellness and recovery  Therapeutic Interventions: Assess for all discharge needs, 1 to 1 time with Social worker, Explore available resources and support systems, Assess for adequacy in community support network, Educate family and significant other(s) on suicide prevention, Complete Psychosocial Assessment, Interpersonal group therapy.  Evaluation of Outcomes: Progressing   Progress in  Treatment: Attending groups: No. Participating in groups: No. Taking medication as prescribed: Yes. Toleration medication: Yes. Family/Significant other contact made: No, will contact:  If consents are given  Patient understands diagnosis: No. Discussing patient identified problems/goals with staff: Yes. Medical problems stabilized or resolved: Yes. Denies suicidal/homicidal ideation: Yes. Issues/concerns per patient self-inventory: No.   New problem(s) identified: No, Describe:  None  New Short Term/Long Term Goal(s): medication stabilization, elimination of SI thoughts, development of comprehensive mental wellness plan.   Patient Goals: "To make my mental health more manageable and to get support with my physical health"  Discharge Plan or Barriers: Patient recently admitted. CSW will continue to follow and assess for appropriate referrals and possible discharge planning.   Reason for Continuation of Hospitalization: Depression Medication stabilization Suicidal ideation  Estimated Length of Stay: 3 to 5 days  Attendees: Patient:Daniel [name] 05/05/2020   Physician: Landry Mellow, MD 05/05/2020   Nursing:  05/05/2020   RN Care Manager: 05/05/2020   Social Worker: Ruthann Cancer, LCSW 05/05/2020   Recreational Therapist:  05/05/2020   Other:  05/05/2020   Other:  05/05/2020   Other: 05/05/2020      Scribe for Treatment Team: Aram Beecham, LCSWA 05/05/2020 9:27 AM
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
## 2020-05-05 NOTE — Progress Notes (Signed)
PT Cancellation Note  Patient Details Name: Daniel [name] MRN: [id number] DOB: 1991-05-25   Cancelled Treatment:    Reason Eval/Treat Not Completed: PT screened, no needs identified, will sign off. Patient is ambulatory on the unit.OT will address RUE .    Rada Hay 05/05/2020, 9:52 AM  Blanchard Kelch PT Acute Rehabilitation Services Pager [phone] Office [phone]

## 2020-05-05 NOTE — Progress Notes (Signed)
    05/05/20 0556  Vital Signs  Pulse Rate 91  BP 129/80  BP Location Left Arm  BP Method Automatic  Patient Position (if appropriate) Standing   D: Patient denies SI/HI/AVH. Patient rated anxiety 5/10 and depression 8/10. Patient rated pain 8/10. Pt. Was isolative. A:  Patient took scheduled medicine. Pt. Given 100 mug of ultram for pain. Pt. Reported that ultram did relieve pain.   Support and encouragement provided Routine safety checks conducted every 15 minutes. Patient  Informed to notify staff with any concerns.   R: Safety maintained.  Wound care: Patient had little pink drainage. Some of the steri-strips came off. No green drainage. Triple antibiotic ointment applied covered with 3 Telfa.

## 2020-05-05 NOTE — Progress Notes (Signed)
Adult Psychoeducational Group Note  Date:  05/05/2020 Time:  10:24 PM  Group Topic/Focus:  Wrap-Up Group:   The focus of this group is to help patients review their daily goal of treatment and discuss progress on daily workbooks.  Participation Level:  Active  Participation Quality:  Appropriate  Affect:  Appropriate  Cognitive:  Alert  Insight: Good  Engagement in Group:  Developing/Improving  Modes of Intervention:  Education and Support  Additional Comments:  Daniel [name] say that he is currently in both physical and psychological pain. He feels that he does no belong to be alive after he recently found out that, he has diabetes.  Now he is hoping he can get back to his feet to do the things he couldn't do right now.  Daniel [name]  Lanice Shirts 05/05/2020, 10:24 PM

## 2020-05-05 NOTE — Progress Notes (Signed)
   05/05/20 2106  COVID-19 Daily Checkoff  Have you had a fever (temp > 37.80C/100F)  in the past 24 hours?  No  COVID-19 EXPOSURE  Have you traveled outside the state in the past 14 days? No  Have you been in contact with someone with a confirmed diagnosis of COVID-19 or PUI in the past 14 days without wearing appropriate PPE? No  Have you been living in the same home as a person with confirmed diagnosis of COVID-19 or a PUI (household contact)? No  Have you been diagnosed with COVID-19? No

## 2020-05-05 NOTE — Progress Notes (Signed)
   05/04/20 2153  COVID-19 Daily Checkoff  Have you had a fever (temp > 37.80C/100F)  in the past 24 hours?  No  COVID-19 EXPOSURE  Have you traveled outside the state in the past 14 days? No  Have you been in contact with someone with a confirmed diagnosis of COVID-19 or PUI in the past 14 days without wearing appropriate PPE? No  Have you been living in the same home as a person with confirmed diagnosis of COVID-19 or a PUI (household contact)? No  Have you been diagnosed with COVID-19? No
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
## 2020-05-05 NOTE — Progress Notes (Signed)
Adult Psychoeducational Group Note  Date:  05/05/2020 Time:  1:53 PM  Group Topic/Focus:  Goals Group:   The focus of this group is to help patients establish daily goals to achieve during treatment and discuss how the patient can incorporate goal setting into their daily lives to aide in recovery.  Participation Level:  Active  Participation Quality:  Appropriate  Affect:  Appropriate  Cognitive:  Alert and Appropriate  Insight: Appropriate, Good and Improving  Engagement in Group:  Engaged  Modes of Intervention:  Discussion  Additional Comments:  Pt attended group and participated in discussion.  Daniel [name] R [name] 05/05/2020, 1:53 PM
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
## 2020-05-06 LAB — GLUCOSE, CAPILLARY
Glucose-Capillary: 90 mg/dL (ref 70–99)
Glucose-Capillary: 105 mg/dL — ABNORMAL HIGH (ref 70–99)
Glucose-Capillary: 87 mg/dL (ref 70–99)
Glucose-Capillary: 108 mg/dL — ABNORMAL HIGH (ref 70–99)

## 2020-05-06 MED ORDER — APIXABAN 5 MG PO TABS
5.0000 mg | ORAL_TABLET | Freq: Two times a day (BID) | ORAL | Status: DC
  Administered 2020-05-06 – 2020-05-08 (×5): 5 mg via ORAL
  Filled 2020-05-06: qty 14
  Filled 2020-05-06 (×3): qty 1
  Filled 2020-05-06: qty 14
  Filled 2020-05-06 (×2): qty 1
  Filled 2020-05-06 (×2): qty 14
  Filled 2020-05-06: qty 1
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
## 2020-05-06 NOTE — Progress Notes (Signed)
D:  Patient denied HI this morning.  Denied A/V hallucinations.  SI, contracts for safety. A:  Medications administered per MD orders.  Patient did refused metformin, that he did not need this med. Emotional support and encouragement given patient. R:  Safety maintained with 15 minute checks.  Dressing change on R upper arm, no swelling, no redness, no odor.

## 2020-05-06 NOTE — Progress Notes (Signed)
Patient states that he had a good talk with his sister today and that he is seeking outpatient therapy.  His goal for tomorrow is to stay positive.
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
## 2020-05-06 NOTE — [facility] Counselor (Signed)
CSW contacted [place] with [facility] this week. [facility] unable to accept pt.
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
## 2020-05-06 NOTE — [facility] Suicide Risk Assessment (Signed)
[facility] INPATIENT:  Family/Significant Other Suicide Prevention Education  Suicide Prevention Education:  Education Completed; Daniel [name] [phone]),  (name of family member/significant other) has been identified by the patient as the family member/significant other with whom the patient will be residing, and identified as the person(s) who will aid the patient in the event of a mental health crisis (suicidal ideations/suicide attempt).  With written consent from the patient, the family member/significant other has been provided the following suicide prevention education, prior to the and/or following the discharge of the patient.  The suicide prevention education provided includes the following:  Suicide risk factors  Suicide prevention and interventions  National Suicide Hotline telephone number  [facility] assessment telephone number  [facility] Emergency Assistance 911  [facility] and/or Residential Mobile Crisis Unit telephone number  Request made of family/significant other to:  Remove weapons (e.g., guns, rifles, knives), all items previously/currently identified as safety concern.    Remove drugs/medications (over-the-counter, prescriptions, illicit drugs), all items previously/currently identified as a safety concern.  CSW spoke with Pt's sister Daniel [name]. CSW provided update regarding mental status and discussed discharge plans. CSW explained in detail why Pt does not qualify for Medicaid benefits. CSW also explained hosptial's recommendation moving forward. SPE was completed. Sister confirmed that Pt does not have access to weapons. The above items have been put away, per sister's report. The family member/significant other verbalizes understanding of the suicide prevention education information provided.The family member/significant other agrees to remove the items of safety concern listed above.  Daniel Oms Breklyn Fabrizio, LCSW 05/06/2020, 4:10 PM

## 2020-05-06 NOTE — Progress Notes (Signed)
Pt has a brighter affect and mood tonight. He was seen interacting with the other patients in the dayroom. He was smiling and laughing occasionally during our conversation. He reports feeling better today compared to yesterday. Pt remains hopeful for his future, said God still has him here for a future. Pt has perseverance that his condition will improve and one day he will be able to meet his daughter who is currently in foster care. He looks forward to attending a rehab center upon discharge to regain his strength in his right arm. Pt's dressing to right upper arm is clean, dry, and intact. Radial pulse is moderate and capillary refill is less than 3 seconds. Pt denies SI/HI and AVH. Active listening, reassurance, and support provided. Medications administered as ordered by MD. Q 15 min safety checks continue. Pt's safety has been maintained.   05/05/20 2106  Psych Admission Type (Psych Patients Only)  Admission Status Voluntary  Psychosocial Assessment  Patient Complaints Anxiety;Depression  Eye Contact Fair  Facial Expression Other (Comment) (appropriate)  Affect Appropriate to circumstance  Speech Logical/coherent  Interaction Assertive  Motor Activity Slow  Appearance/Hygiene Unremarkable  Behavior Characteristics Cooperative;Calm  Mood Pleasant  Thought Process  Coherency WDL  Content WDL  Delusions None reported or observed  Perception WDL  Hallucination None reported or observed  Judgment Poor  Confusion None  Danger to Self  Current suicidal ideation? Denies  Self-Injurious Behavior No self-injurious ideation or behavior indicators observed or expressed   Danger to Others  Danger to Others None reported or observed

## 2020-05-06 NOTE — Progress Notes (Signed)
   05/06/20 2045  COVID-19 Daily Checkoff  Have you had a fever (temp > 37.80C/100F)  in the past 24 hours?  No  COVID-19 EXPOSURE  Have you traveled outside the state in the past 14 days? No  Have you been in contact with someone with a confirmed diagnosis of COVID-19 or PUI in the past 14 days without wearing appropriate PPE? No  Have you been living in the same home as a person with confirmed diagnosis of COVID-19 or a PUI (household contact)? No  Have you been diagnosed with COVID-19? No

## 2020-05-07 LAB — GLUCOSE, CAPILLARY: Glucose-Capillary: 89 mg/dL (ref 70–99)

## 2020-05-07 MED ORDER — DULOXETINE HCL 20 MG PO CPEP
40.0000 mg | ORAL_CAPSULE | Freq: Every day | ORAL | Status: DC
  Administered 2020-05-08: 40 mg via ORAL
  Filled 2020-05-07: qty 2
  Filled 2020-05-07 (×2): qty 14
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
## 2020-05-07 NOTE — Progress Notes (Signed)
Pt shares how he was angry and upset earlier during the day because he was denied for Medicaid. Pt educated that projecting anger onto someone else is not an effective coping skill. Encouraged pt to develop coping skills for anger management and he demonstrated understanding. Pt did say that he did apologize to the nurse. His goal is to write in his journal and work on his patience. He has been present in the [employer], he attended group, and has been interacting well with his peers. He has been in a pleasant mood and smiled occasionally during interactions. Pt denies SI/HI and AVH. Active listening, reassurance, and support provided. Medications administered as ordered by MD. Q 15 min safety checks continue. Pt's safety has been maintained.   05/06/20 2045  Psych Admission Type (Psych Patients Only)  Admission Status Voluntary  Psychosocial Assessment  Patient Complaints Anxiety;Depression;Sadness;Worrying  Eye Contact Fair  Facial Expression Other (Comment) (appropriate)  Affect Appropriate to circumstance  Speech Logical/coherent  Interaction Assertive  Motor Activity Slow  Appearance/Hygiene Unremarkable  Behavior Characteristics Cooperative;Anxious;Calm  Mood Depressed;Anxious;Sad;Pleasant  Thought Process  Coherency WDL  Content WDL  Delusions None reported or observed  Perception WDL  Hallucination None reported or observed  Judgment Poor  Confusion None  Danger to Self  Current suicidal ideation? Denies  Self-Injurious Behavior No self-injurious ideation or behavior indicators observed or expressed   Danger to Others  Danger to Others None reported or observed

## 2020-05-07 NOTE — Progress Notes (Signed)
   05/07/20 2218  Psych Admission Type (Psych Patients Only)  Admission Status Voluntary  Psychosocial Assessment  Patient Complaints Anxiety  Eye Contact Fair  Facial Expression Anxious  Affect Appropriate to circumstance  Speech Logical/coherent  Interaction Assertive  Motor Activity Slow  Appearance/Hygiene Unremarkable  Behavior Characteristics Cooperative  Mood Depressed;Anxious  Thought Process  Coherency WDL  Content WDL  Delusions None reported or observed  Perception WDL  Hallucination None reported or observed  Judgment Poor  Confusion None  Danger to Self  Current suicidal ideation? Denies  Self-Injurious Behavior No self-injurious ideation or behavior indicators observed or expressed   Danger to Others  Danger to Others None reported or observed

## 2020-05-07 NOTE — Progress Notes (Addendum)
   05/07/20 0631  Vital Signs  Pulse Rate 90  BP 131/90  BP Location Left Arm  BP Method Automatic  Patient Position (if appropriate) Standing   D: Patient denies SI/HI/AVH. Patient rated pain 10/10. Patient rated anxiety 10/10 and depression 8/10. Patient out in open areas and was social with peers and staff.  A:  Patient took some  scheduled medicine, but rejected having his blood sugar checked and refused insulin, metformin and Protonix. Pt. Reported that he didn't want to take any extra medicines . Pat Support and encouragement provided Routine safety checks conducted every 15 minutes. Patient  Informed to notify staff with any concerns.   R: Safety maintained.  Wound Care to right arm: Triple abx ointment applied. No drainage present. Covered with 3 Telfa.
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
## 2020-05-07 NOTE — [facility] Counselor (Signed)
CSW spoke with Pt today. Pt reports fair mood and that he does not feel ready for discharge. Pt feels his medications are not working and would like continued adjustment. CSW did speak with Pt about other potential resources post discharge. He is now willing to accept bus passes. CSW discussed with Pt the possibility of signing up for Medicaid Disability. Pt is wanting to complete the application here. CSW could not guarantee to Pt that this would be possible as there are limitations. CSW did investigate what is needed to complete the application; there are many items that neither Pt nor CSW would have access to while here, that would allow for completion. CSW attempted to explain this to Pt however he was not receptive. Pt was argumentative and irritable. Pt reports feeling of being disregarded as it relates to having needs med. Pt lacks understanding of what can and cannot be accomplished in the acute hospital setting. Due to Pt's increasing irritability CSW terminated the conversation. Pt did not accept reference sheet provided.   Jacinta Shoe, LCSW

## 2020-05-07 NOTE — Progress Notes (Signed)
Recreation Therapy Notes  Date:  10.1.21 Time: 0930 Location: [address] Dayroom  Group Topic: Stress Management  Goal Area(s) Addresses:  Patient will identify positive stress management techniques. Patient will identify benefits of using stress management post d/c.  Behavioral Response: Engaged  Intervention: Stress Management    Activity:  Guided Imagery.  LRT read a script that took patients on a journey to the beach to relax by the calming, peaceful waves.  Patients were to listen and follow along as the script was read to engage in the activity.    Education:  Stress Management, Discharge Planning.   Education Outcome: Acknowledges Education  Clinical Observations/Feedback: Pt attended and participated in activity.    Caroll Rancher, LRT/CTRS         Lillia Abed, Kay Shippy A 05/07/2020 10:01 AM

## 2020-05-08 MED ORDER — APIXABAN 5 MG PO TABS: 5.0000 mg | ORAL_TABLET | Freq: Two times a day (BID) | ORAL | 0 refills | Status: DC

## 2020-05-08 MED ORDER — METFORMIN HCL 500 MG PO TABS
500.0000 mg | ORAL_TABLET | Freq: Two times a day (BID) | ORAL | 0 refills | Status: DC
Start: 2020-05-08 — End: 2020-05-20

## 2020-05-08 MED ORDER — DULOXETINE HCL 40 MG PO CPEP
40.0000 mg | ORAL_CAPSULE | Freq: Every day | ORAL | 0 refills | Status: DC
Start: 2020-05-09 — End: 2020-05-20

## 2020-05-08 MED ORDER — NICOTINE POLACRILEX 2 MG MT GUM
2.0000 mg | CHEWING_GUM | OROMUCOSAL | 0 refills | Status: DC | PRN
Start: 2020-05-08 — End: 2020-05-20

## 2020-05-08 MED ORDER — GABAPENTIN 400 MG PO CAPS
400.0000 mg | ORAL_CAPSULE | Freq: Three times a day (TID) | ORAL | 0 refills | Status: DC
Start: 2020-05-08 — End: 2020-05-20

## 2020-05-08 MED ORDER — PANTOPRAZOLE SODIUM 40 MG PO TBEC
40.0000 mg | DELAYED_RELEASE_TABLET | Freq: Every day | ORAL | 0 refills | Status: DC
Start: 2020-05-09 — End: 2020-05-20

## 2020-05-08 MED ORDER — QUETIAPINE FUMARATE 200 MG PO TABS
200.0000 mg | ORAL_TABLET | Freq: Every day | ORAL | 0 refills | Status: DC
Start: 2020-05-08 — End: 2020-05-20

## 2020-05-08 MED ORDER — METHOCARBAMOL 500 MG PO TABS
1000.0000 mg | ORAL_TABLET | Freq: Three times a day (TID) | ORAL | 0 refills | Status: DC | PRN
Start: 2020-05-08 — End: 2020-05-20

## 2020-05-08 MED ORDER — TRAZODONE HCL 50 MG PO TABS
50.0000 mg | ORAL_TABLET | Freq: Every evening | ORAL | 0 refills | Status: DC | PRN
Start: 2020-05-08 — End: 2020-05-20

## 2020-05-08 MED ORDER — HYDROXYZINE HCL 25 MG PO TABS
25.0000 mg | ORAL_TABLET | Freq: Three times a day (TID) | ORAL | 0 refills | Status: DC | PRN
Start: 2020-05-08 — End: 2020-05-20

## 2020-05-08 MED ORDER — BACITRACIN-NEOMYCIN-POLYMYXIN OINTMENT TUBE: 1.0000 "application " | TOPICAL_OINTMENT | Freq: Two times a day (BID) | CUTANEOUS | Status: DC

## 2020-05-08 MED ORDER — CHOLECALCIFEROL 10 MCG (400 UNIT) PO TABS: 400.0000 [IU] | ORAL_TABLET | Freq: Every day | ORAL | 0 refills | Status: DC
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
## 2020-05-08 NOTE — Progress Notes (Addendum)
[place] NOVEL CORONAVIRUS (COVID-19) DAILY CHECK-OFF SYMPTOMS - answer yes or no to each - every day NO YES  Have you had a fever in the past 24 hours?  . Fever (Temp > 37.80C / 100F) X   Have you had any of these symptoms in the past 24 hours? . New Cough .  Sore Throat  .  Shortness of Breath .  Difficulty Breathing .  Unexplained Body Aches   X   Have you had any one of these symptoms in the past 24 hours not related to allergies?   . Runny Nose .  Nasal Congestion .  Sneezing   X   If you have had runny nose, nasal congestion, sneezing in the past 24 hours, has it worsened?  X   EXPOSURES - check yes or no X   Have you traveled outside the state in the past 14 days?  X   Have you been in contact with someone with a confirmed diagnosis of COVID-19 or PUI in the past 14 days without wearing appropriate PPE?  X   Have you been living in the same home as a person with confirmed diagnosis of COVID-19 or a PUI (household contact)?    X   Have you been diagnosed with COVID-19?    X              What to do next: Answered NO to all: Answered YES to anything:   Proceed with unit schedule Follow the [facility] Inpatient Flowsheet.   D:Pt presents with a flat affect, anxious mood this am- brightens during interactions- calm, cooperative behavior- observed in the milieu attending groups Per pt's self inventory, pt rated his depression, hopelessness and anxiety an 03/14/09, respectively. Pt writes that his goal today is "to heal up" and "rest". Pt refused blood sugar checks/insulin and Protonix. Pt reports not "needing insulin", as he doesn't take insulin at home. Pt currently denies SI/HI and A/VH. A: Labs and vitals monitored. Administered scheduled medications and gave prn meds for R arm pain. Antibiotic and three Telfa gauzes applied to right arm wound. No signs of infection/ drainage noted.  R: Patient remains safe with Q 15 min checks.

## 2020-05-08 NOTE — Progress Notes (Signed)
  [facility] Adult Case Management Discharge Plan :  Will you be returning to the same living situation after discharge:  Yes,  back with sister and her family At discharge, do you have transportation home?: Yes,  sister picking up Do you have the ability to pay for your medications: No.  Will need help from local agency  Release of information consent forms completed and emailed to Medical Records, then turned in to Medical Records by CSW.   Patient to Follow up at:  Follow-up Information    [facility]. Schedule an appointment as soon as possible for a visit.   Specialty: Urgent Care Why: This agency has a walk-in clinic open Monday-Friday 8am-5pm with therapists and psychiatrists available.  You can call ahead for an appointment or just show up.  Please go within 7-14 days of discharge. Contact information: [address] [place] [address] [phone]              Next level of care provider has access to [facility] Link:yes  Safety Planning and Suicide Prevention discussed: Yes,  with sister     Has patient been referred to the Quitline?: N/A patient is not a smoker  Patient has been referred for addiction treatment: Yes  Lynnell Chad, LCSW 05/08/2020, 3:00 PM

## 2020-05-08 NOTE — Progress Notes (Signed)
Pt discharged to lobby to wait on [employer] for transportation home. Pt was stable and appreciative at that time. All papers were given and valuables returned.  Verbal understanding expressed. Denies SI/HI and A/VH. Pt given opportunity to express concerns and ask questions.

## 2020-05-08 NOTE — [facility] Group Notes (Signed)
LCSW Group Therapy Note  05/08/2020   10:00-11:00am   Type of Therapy and Topic:  Group Therapy: Anger Cues and Responses  Participation Level:  Active   Description of Group:   In this group, patients learned how to recognize the physical, cognitive, emotional, and behavioral responses they have to anger-provoking situations.  They identified a recent time they became angry and how they reacted.  They analyzed how their reaction was possibly beneficial and how it was possibly unhelpful.  The group discussed a variety of healthier coping skills that could help with such a situation in the future.  Focus was placed on how helpful it is to recognize the underlying emotions to our anger, because working on those can lead to a more permanent solution as well as our ability to focus on the important rather than the urgent.  Therapeutic Goals: Patients will remember their last incident of anger and how they felt emotionally and physically, what their thoughts were at the time, and how they behaved. Patients will identify how their behavior at that time worked for them, as well as how it worked against them. Patients will explore possible new behaviors to use in future anger situations. Patients will learn that anger itself is normal and cannot be eliminated, and that healthier reactions can assist with resolving conflict rather than worsening situations.  Summary of Patient Progress:  The patient shared that his most recent time of anger was yesterday and said he was repeatedly upset when he tried to get help from his social worker to apply for disability.  He felt like the CSW kept "putting up excuses" and refused to do her job.  His physical reaction was to clench his teeth and start panting.  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  He said his action was to cuss her out.    Therapeutic Modalities:   Cognitive Behavioral Therapy  Lynnell Chad
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
## 2020-05-09 ENCOUNTER — Other Ambulatory Visit: Payer: Self-pay

## 2020-05-09 ENCOUNTER — Emergency Department (HOSPITAL_COMMUNITY): Payer: Self-pay

## 2020-05-09 ENCOUNTER — Emergency Department (HOSPITAL_COMMUNITY)
Admission: EM | Admit: 2020-05-09 | Discharge: 2020-05-09 | Disposition: A | Discharge status: Home / Self Care | Payer: Self-pay | Attending: Emergency Medicine | Admitting: Emergency Medicine

## 2020-05-09 DIAGNOSIS — F17210 Nicotine dependence, cigarettes, uncomplicated: Secondary | ICD-10-CM | POA: Insufficient documentation

## 2020-05-09 DIAGNOSIS — R0602 Shortness of breath: Secondary | ICD-10-CM | POA: Insufficient documentation

## 2020-05-09 DIAGNOSIS — Z7901 Long term (current) use of anticoagulants: Secondary | ICD-10-CM | POA: Insufficient documentation

## 2020-05-09 DIAGNOSIS — R0789 Other chest pain: Secondary | ICD-10-CM | POA: Insufficient documentation

## 2020-05-09 LAB — CBC WITH DIFFERENTIAL/PLATELET
Abs Immature Granulocytes: 0.07 10*3/uL (ref 0.00–0.07)
Basophils Absolute: 0.1 10*3/uL (ref 0.0–0.1)
Basophils Relative: 1 %
Eosinophils Absolute: 0.1 10*3/uL (ref 0.0–0.5)
Eosinophils Relative: 1 %
HCT: 41.1 % (ref 39.0–52.0)
Hemoglobin: 13.7 g/dL (ref 13.0–17.0)
Immature Granulocytes: 1 %
Lymphocytes Relative: 16 %
Lymphs Abs: 2.1 10*3/uL (ref 0.7–4.0)
MCH: 30.1 pg (ref 26.0–34.0)
MCHC: 33.3 g/dL (ref 30.0–36.0)
MCV: 90.3 fL (ref 80.0–100.0)
Monocytes Absolute: 0.9 10*3/uL (ref 0.1–1.0)
Monocytes Relative: 7 %
Neutro Abs: 9.9 10*3/uL — ABNORMAL HIGH (ref 1.7–7.7)
Neutrophils Relative %: 74 %
Platelets: 387 10*3/uL (ref 150–400)
RBC: 4.55 MIL/uL (ref 4.22–5.81)
RDW: 14.1 % (ref 11.5–15.5)
WBC: 13.1 10*3/uL — ABNORMAL HIGH (ref 4.0–10.5)
nRBC: 0 % (ref 0.0–0.2)

## 2020-05-09 LAB — BASIC METABOLIC PANEL
Anion gap: 11 (ref 5–15)
BUN: 12 mg/dL (ref 6–20)
CO2: 28 mmol/L (ref 22–32)
Calcium: 9.5 mg/dL (ref 8.9–10.3)
Chloride: 97 mmol/L — ABNORMAL LOW (ref 98–111)
Creatinine, Ser: 0.83 mg/dL (ref 0.61–1.24)
GFR calc Af Amer: >60 mL/min (ref >60)
GFR calc non Af Amer: >60 mL/min (ref >60)
Glucose, Bld: 83 mg/dL (ref 70–99)
Potassium: 4.8 mmol/L (ref 3.5–5.1)
Sodium: 136 mmol/L (ref 135–145)

## 2020-05-09 MED ORDER — OXYCODONE-ACETAMINOPHEN 5-325 MG PO TABS
1.0000 | ORAL_TABLET | Freq: Once | ORAL | Status: AC
  Administered 2020-05-09: 1 via ORAL
  Filled 2020-05-09: qty 1

## 2020-05-09 MED ORDER — MORPHINE SULFATE (PF) 4 MG/ML IV SOLN
4.0000 mg | Freq: Once | INTRAVENOUS | Status: DC
  Filled 2020-05-09: qty 1
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
## 2020-05-09 NOTE — Discharge Instructions (Addendum)
Continue your home medications as previously prescribed. Follow-up with the surgery center listed below. Return to the ER if you start to experience fever, vomiting, coughing up blood, leg swelling, additional injuries.

## 2020-05-09 NOTE — ED Notes (Signed)
Pt EKG exported into the system and ready for viewing.
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
## 2020-05-11 ENCOUNTER — Ambulatory Visit: Payer: Self-pay | Attending: Physician Assistant | Admitting: Occupational Therapy

## 2020-05-11 ENCOUNTER — Other Ambulatory Visit: Payer: Self-pay

## 2020-05-11 ENCOUNTER — Ambulatory Visit: Payer: Self-pay | Admitting: Physical Therapy

## 2020-05-11 DIAGNOSIS — M6281 Muscle weakness (generalized): Secondary | ICD-10-CM | POA: Insufficient documentation

## 2020-05-11 DIAGNOSIS — M25511 Pain in right shoulder: Secondary | ICD-10-CM | POA: Insufficient documentation

## 2020-05-11 DIAGNOSIS — M25621 Stiffness of right elbow, not elsewhere classified: Secondary | ICD-10-CM | POA: Insufficient documentation

## 2020-05-11 DIAGNOSIS — M25611 Stiffness of right shoulder, not elsewhere classified: Secondary | ICD-10-CM | POA: Insufficient documentation

## 2020-05-11 DIAGNOSIS — R278 Other lack of coordination: Secondary | ICD-10-CM | POA: Insufficient documentation

## 2020-05-11 DIAGNOSIS — R29818 Other symptoms and signs involving the nervous system: Secondary | ICD-10-CM | POA: Insufficient documentation
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
## 2020-05-13 ENCOUNTER — Inpatient Hospital Stay (INDEPENDENT_AMBULATORY_CARE_PROVIDER_SITE_OTHER): Payer: Self-pay | Admitting: Primary Care

## 2020-05-16 ENCOUNTER — Emergency Department (HOSPITAL_COMMUNITY): Payer: Self-pay

## 2020-05-16 ENCOUNTER — Other Ambulatory Visit: Payer: Self-pay

## 2020-05-16 ENCOUNTER — Emergency Department (HOSPITAL_COMMUNITY)
Admission: EM | Admit: 2020-05-16 | Discharge: 2020-05-16 | Disposition: A | Discharge status: Home / Self Care | Payer: Self-pay | Attending: Emergency Medicine | Admitting: Emergency Medicine

## 2020-05-16 DIAGNOSIS — Z5321 Procedure and treatment not carried out due to patient leaving prior to being seen by health care provider: Secondary | ICD-10-CM | POA: Insufficient documentation

## 2020-05-16 DIAGNOSIS — R072 Precordial pain: Secondary | ICD-10-CM | POA: Insufficient documentation

## 2020-05-16 DIAGNOSIS — M79601 Pain in right arm: Secondary | ICD-10-CM | POA: Insufficient documentation

## 2020-05-16 LAB — BASIC METABOLIC PANEL
Anion gap: 9 (ref 5–15)
BUN: 10 mg/dL (ref 6–20)
CO2: 28 mmol/L (ref 22–32)
Calcium: 9 mg/dL (ref 8.9–10.3)
Chloride: 105 mmol/L (ref 98–111)
Creatinine, Ser: 0.96 mg/dL (ref 0.61–1.24)
GFR, Estimated: >60 mL/min (ref >60)
Glucose, Bld: 104 mg/dL — ABNORMAL HIGH (ref 70–99)
Potassium: 3.9 mmol/L (ref 3.5–5.1)
Sodium: 142 mmol/L (ref 135–145)

## 2020-05-16 LAB — CBC
HCT: 42.3 % (ref 39.0–52.0)
Hemoglobin: 13.9 g/dL (ref 13.0–17.0)
MCH: 30.1 pg (ref 26.0–34.0)
MCHC: 32.9 g/dL (ref 30.0–36.0)
MCV: 91.6 fL (ref 80.0–100.0)
Platelets: 280 10*3/uL (ref 150–400)
RBC: 4.62 MIL/uL (ref 4.22–5.81)
RDW: 14.5 % (ref 11.5–15.5)
WBC: 8.5 10*3/uL (ref 4.0–10.5)
nRBC: 0 % (ref 0.0–0.2)

## 2020-05-16 LAB — TROPONIN I (HIGH SENSITIVITY): Troponin I (High Sensitivity): <2 ng/L (ref <18)

## 2020-05-16 NOTE — ED Notes (Signed)
Pt stickers brought to me by the screeners.
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
## 2020-05-17 ENCOUNTER — Emergency Department (HOSPITAL_COMMUNITY)
Admission: EM | Admit: 2020-05-17 | Discharge: 2020-05-18 | Disposition: A | Discharge status: Other Acute Inpatient Hospital | Payer: Self-pay | Attending: Emergency Medicine | Admitting: Emergency Medicine

## 2020-05-17 ENCOUNTER — Encounter (HOSPITAL_COMMUNITY): Payer: Self-pay | Admitting: Emergency Medicine

## 2020-05-17 DIAGNOSIS — Y909 Presence of alcohol in blood, level not specified: Secondary | ICD-10-CM | POA: Insufficient documentation

## 2020-05-17 DIAGNOSIS — R531 Weakness: Secondary | ICD-10-CM | POA: Insufficient documentation

## 2020-05-17 DIAGNOSIS — F329 Major depressive disorder, single episode, unspecified: Secondary | ICD-10-CM | POA: Insufficient documentation

## 2020-05-17 DIAGNOSIS — R45851 Suicidal ideations: Secondary | ICD-10-CM | POA: Insufficient documentation

## 2020-05-17 DIAGNOSIS — Z7901 Long term (current) use of anticoagulants: Secondary | ICD-10-CM | POA: Insufficient documentation

## 2020-05-17 DIAGNOSIS — F17210 Nicotine dependence, cigarettes, uncomplicated: Secondary | ICD-10-CM | POA: Insufficient documentation

## 2020-05-17 DIAGNOSIS — Z20822 Contact with and (suspected) exposure to covid-19: Secondary | ICD-10-CM | POA: Insufficient documentation

## 2020-05-17 DIAGNOSIS — Z046 Encounter for general psychiatric examination, requested by authority: Secondary | ICD-10-CM | POA: Insufficient documentation

## 2020-05-17 DIAGNOSIS — Z7984 Long term (current) use of oral hypoglycemic drugs: Secondary | ICD-10-CM | POA: Insufficient documentation

## 2020-05-17 DIAGNOSIS — F1290 Cannabis use, unspecified, uncomplicated: Secondary | ICD-10-CM | POA: Insufficient documentation

## 2020-05-17 DIAGNOSIS — F1420 Cocaine dependence, uncomplicated: Secondary | ICD-10-CM | POA: Insufficient documentation

## 2020-05-17 LAB — URINALYSIS, ROUTINE W REFLEX MICROSCOPIC
Bilirubin Urine: NEGATIVE
Glucose, UA: NEGATIVE mg/dL
Hgb urine dipstick: NEGATIVE
Ketones, ur: NEGATIVE mg/dL
Leukocytes,Ua: NEGATIVE
Nitrite: NEGATIVE
Protein, ur: NEGATIVE mg/dL
Specific Gravity, Urine: 1.012 (ref 1.005–1.030)
pH: 8 (ref 5.0–8.0)

## 2020-05-17 LAB — CBC
HCT: 42.8 % (ref 39.0–52.0)
Hemoglobin: 13.6 g/dL (ref 13.0–17.0)
MCH: 29.1 pg (ref 26.0–34.0)
MCHC: 31.8 g/dL (ref 30.0–36.0)
MCV: 91.5 fL (ref 80.0–100.0)
Platelets: 268 10*3/uL (ref 150–400)
RBC: 4.68 MIL/uL (ref 4.22–5.81)
RDW: 14.5 % (ref 11.5–15.5)
WBC: 9.9 10*3/uL (ref 4.0–10.5)
nRBC: 0 % (ref 0.0–0.2)

## 2020-05-17 LAB — RAPID URINE DRUG SCREEN, HOSP PERFORMED
Amphetamines: NOT DETECTED
Barbiturates: NOT DETECTED
Benzodiazepines: NOT DETECTED
Cocaine: POSITIVE — AB
Opiates: NOT DETECTED
Tetrahydrocannabinol: POSITIVE — AB

## 2020-05-17 LAB — COMPREHENSIVE METABOLIC PANEL
ALT: 14 U/L (ref 0–44)
AST: 19 U/L (ref 15–41)
Albumin: 3.4 g/dL — ABNORMAL LOW (ref 3.5–5.0)
Alkaline Phosphatase: 88 U/L (ref 38–126)
Anion gap: 11 (ref 5–15)
BUN: 8 mg/dL (ref 6–20)
CO2: 25 mmol/L (ref 22–32)
Calcium: 8.9 mg/dL (ref 8.9–10.3)
Chloride: 101 mmol/L (ref 98–111)
Creatinine, Ser: 0.92 mg/dL (ref 0.61–1.24)
GFR, Estimated: >60 mL/min (ref >60)
Glucose, Bld: 137 mg/dL — ABNORMAL HIGH (ref 70–99)
Potassium: 3.7 mmol/L (ref 3.5–5.1)
Sodium: 137 mmol/L (ref 135–145)
Total Bilirubin: 0.9 mg/dL (ref 0.3–1.2)
Total Protein: 7 g/dL (ref 6.5–8.1)

## 2020-05-17 LAB — SALICYLATE LEVEL: Salicylate Lvl: <7 mg/dL — ABNORMAL LOW (ref 7.0–30.0)

## 2020-05-17 LAB — ETHANOL: Alcohol, Ethyl (B): <10 mg/dL (ref <10)

## 2020-05-17 LAB — RESPIRATORY PANEL BY RT PCR (FLU A&B, COVID)
Influenza A by PCR: NEGATIVE
Influenza B by PCR: NEGATIVE
SARS Coronavirus 2 by RT PCR: NEGATIVE

## 2020-05-17 LAB — ACETAMINOPHEN LEVEL: Acetaminophen (Tylenol), Serum: <10 ug/mL — ABNORMAL LOW (ref 10–30)

## 2020-05-17 MED ORDER — APIXABAN 5 MG PO TABS
5.0000 mg | ORAL_TABLET | Freq: Two times a day (BID) | ORAL | Status: DC
  Administered 2020-05-17 – 2020-05-18 (×2): 5 mg via ORAL
  Filled 2020-05-17 (×2): qty 1

## 2020-05-17 MED ORDER — HYDROXYZINE HCL 25 MG PO TABS
25.0000 mg | ORAL_TABLET | Freq: Three times a day (TID) | ORAL | Status: DC | PRN
  Administered 2020-05-17: 25 mg via ORAL
  Filled 2020-05-17: qty 1

## 2020-05-17 MED ORDER — METFORMIN HCL 500 MG PO TABS
500.0000 mg | ORAL_TABLET | Freq: Two times a day (BID) | ORAL | Status: DC
  Administered 2020-05-17 – 2020-05-18 (×2): 500 mg via ORAL
  Filled 2020-05-17 (×2): qty 1

## 2020-05-17 MED ORDER — QUETIAPINE FUMARATE 200 MG PO TABS
200.0000 mg | ORAL_TABLET | Freq: Every day | ORAL | Status: DC
  Administered 2020-05-17: 200 mg via ORAL
  Filled 2020-05-17 (×3): qty 1

## 2020-05-17 MED ORDER — OXYCODONE-ACETAMINOPHEN 5-325 MG PO TABS
1.0000 | ORAL_TABLET | Freq: Four times a day (QID) | ORAL | Status: DC | PRN
  Administered 2020-05-17: 2 via ORAL
  Administered 2020-05-18 (×2): 1 via ORAL
  Filled 2020-05-17: qty 2
  Filled 2020-05-17 (×2): qty 1

## 2020-05-17 MED ORDER — GABAPENTIN 300 MG PO CAPS
400.0000 mg | ORAL_CAPSULE | Freq: Three times a day (TID) | ORAL | Status: DC
  Administered 2020-05-17 – 2020-05-18 (×3): 400 mg via ORAL
  Filled 2020-05-17 (×3): qty 1

## 2020-05-17 MED ORDER — METHOCARBAMOL 500 MG PO TABS
1000.0000 mg | ORAL_TABLET | Freq: Three times a day (TID) | ORAL | Status: DC | PRN
  Administered 2020-05-18: 1000 mg via ORAL
  Filled 2020-05-17: qty 2

## 2020-05-17 MED ORDER — DULOXETINE HCL 20 MG PO CPEP
40.0000 mg | ORAL_CAPSULE | Freq: Every day | ORAL | Status: DC
  Administered 2020-05-17 – 2020-05-18 (×2): 40 mg via ORAL
  Filled 2020-05-17 (×3): qty 2

## 2020-05-17 MED ORDER — TRAZODONE HCL 50 MG PO TABS: 50.0000 mg | ORAL_TABLET | Freq: Every evening | ORAL | Status: DC | PRN

## 2020-05-17 NOTE — ED Notes (Signed)
Pt changed into purple scrubs and wanded by security 1 bag of belongings at nurses desk in triage
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
## 2020-05-17 NOTE — BH Assessment (Signed)
Thomas NP recommended a inpatient admissions to assist with stabilization.
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
## 2020-05-17 NOTE — ED Notes (Signed)
Pt given turkey sandwich and drink 

## 2020-05-18 ENCOUNTER — Inpatient Hospital Stay (HOSPITAL_COMMUNITY)
Admission: AD | Admit: 2020-05-18 | Discharge: 2020-05-20 | DRG: 885 | Disposition: A | Discharge status: Home / Self Care | Payer: Federal, State, Local not specified - Other | Source: Intra-hospital | Attending: Psychiatry | Admitting: Psychiatry

## 2020-05-18 DIAGNOSIS — F332 Major depressive disorder, recurrent severe without psychotic features: Secondary | ICD-10-CM | POA: Diagnosis not present

## 2020-05-18 DIAGNOSIS — F4310 Post-traumatic stress disorder, unspecified: Secondary | ICD-10-CM | POA: Diagnosis present

## 2020-05-18 DIAGNOSIS — Z813 Family history of other psychoactive substance abuse and dependence: Secondary | ICD-10-CM | POA: Diagnosis not present

## 2020-05-18 DIAGNOSIS — T43216A Underdosing of selective serotonin and norepinephrine reuptake inhibitors, initial encounter: Secondary | ICD-10-CM | POA: Diagnosis present

## 2020-05-18 DIAGNOSIS — Z888 Allergy status to other drugs, medicaments and biological substances status: Secondary | ICD-10-CM

## 2020-05-18 DIAGNOSIS — T43596A Underdosing of other antipsychotics and neuroleptics, initial encounter: Secondary | ICD-10-CM | POA: Diagnosis present

## 2020-05-18 DIAGNOSIS — K219 Gastro-esophageal reflux disease without esophagitis: Secondary | ICD-10-CM | POA: Diagnosis present

## 2020-05-18 DIAGNOSIS — F339 Major depressive disorder, recurrent, unspecified: Secondary | ICD-10-CM | POA: Diagnosis present

## 2020-05-18 DIAGNOSIS — Z91120 Patient's intentional underdosing of medication regimen due to financial hardship: Secondary | ICD-10-CM | POA: Diagnosis not present

## 2020-05-18 DIAGNOSIS — Z599 Problem related to housing and economic circumstances, unspecified: Secondary | ICD-10-CM | POA: Diagnosis not present

## 2020-05-18 DIAGNOSIS — R45851 Suicidal ideations: Secondary | ICD-10-CM | POA: Diagnosis present

## 2020-05-18 DIAGNOSIS — F17210 Nicotine dependence, cigarettes, uncomplicated: Secondary | ICD-10-CM | POA: Diagnosis present

## 2020-05-18 DIAGNOSIS — F329 Major depressive disorder, single episode, unspecified: Secondary | ICD-10-CM | POA: Diagnosis present

## 2020-05-18 DIAGNOSIS — G4700 Insomnia, unspecified: Secondary | ICD-10-CM | POA: Diagnosis present

## 2020-05-18 DIAGNOSIS — E1140 Type 2 diabetes mellitus with diabetic neuropathy, unspecified: Secondary | ICD-10-CM | POA: Diagnosis present

## 2020-05-18 DIAGNOSIS — Z7984 Long term (current) use of oral hypoglycemic drugs: Secondary | ICD-10-CM | POA: Diagnosis not present

## 2020-05-18 DIAGNOSIS — Y929 Unspecified place or not applicable: Secondary | ICD-10-CM

## 2020-05-18 LAB — URINE CULTURE: Culture: NO GROWTH

## 2020-05-18 MED ORDER — METFORMIN HCL 500 MG PO TABS
500.0000 mg | ORAL_TABLET | Freq: Two times a day (BID) | ORAL | Status: DC
  Administered 2020-05-20: 500 mg via ORAL
  Filled 2020-05-18: qty 1
  Filled 2020-05-18 (×2): qty 14
  Filled 2020-05-18 (×4): qty 1

## 2020-05-18 MED ORDER — HYDROXYZINE HCL 25 MG PO TABS
25.0000 mg | ORAL_TABLET | Freq: Three times a day (TID) | ORAL | Status: DC | PRN
  Administered 2020-05-18 – 2020-05-19 (×2): 25 mg via ORAL
  Filled 2020-05-18: qty 10
  Filled 2020-05-18 (×2): qty 1

## 2020-05-18 MED ORDER — QUETIAPINE FUMARATE 200 MG PO TABS
200.0000 mg | ORAL_TABLET | Freq: Once | ORAL | Status: AC
  Administered 2020-05-18: 200 mg via ORAL
  Filled 2020-05-18 (×2): qty 1

## 2020-05-18 MED ORDER — OXYCODONE-ACETAMINOPHEN 5-325 MG PO TABS
2.0000 | ORAL_TABLET | Freq: Once | ORAL | Status: AC
  Administered 2020-05-18: 2 via ORAL
  Filled 2020-05-18: qty 2

## 2020-05-18 MED ORDER — METHOCARBAMOL 500 MG PO TABS
1000.0000 mg | ORAL_TABLET | Freq: Three times a day (TID) | ORAL | Status: DC | PRN
  Administered 2020-05-19 (×2): 1000 mg via ORAL
  Filled 2020-05-18 (×3): qty 2

## 2020-05-18 MED ORDER — TRAZODONE HCL 50 MG PO TABS
50.0000 mg | ORAL_TABLET | Freq: Every evening | ORAL | Status: DC | PRN
  Administered 2020-05-18 – 2020-05-19 (×2): 50 mg via ORAL
  Filled 2020-05-18: qty 7
  Filled 2020-05-18 (×2): qty 1

## 2020-05-18 MED ORDER — DULOXETINE HCL 20 MG PO CPEP
40.0000 mg | ORAL_CAPSULE | Freq: Every day | ORAL | Status: DC
  Administered 2020-05-19: 40 mg via ORAL
  Filled 2020-05-18 (×2): qty 2

## 2020-05-18 MED ORDER — QUETIAPINE FUMARATE 200 MG PO TABS
200.0000 mg | ORAL_TABLET | Freq: Every day | ORAL | Status: DC
  Administered 2020-05-19: 200 mg via ORAL
  Filled 2020-05-18 (×2): qty 1
  Filled 2020-05-18: qty 7
  Filled 2020-05-18: qty 1

## 2020-05-18 MED ORDER — APIXABAN 5 MG PO TABS
5.0000 mg | ORAL_TABLET | Freq: Two times a day (BID) | ORAL | Status: DC
  Administered 2020-05-19 – 2020-05-20 (×3): 5 mg via ORAL
  Filled 2020-05-18: qty 14
  Filled 2020-05-18: qty 1
  Filled 2020-05-18: qty 14
  Filled 2020-05-18 (×4): qty 1

## 2020-05-18 MED ORDER — GABAPENTIN 400 MG PO CAPS
400.0000 mg | ORAL_CAPSULE | Freq: Three times a day (TID) | ORAL | Status: DC
  Administered 2020-05-19 – 2020-05-20 (×4): 400 mg via ORAL
  Filled 2020-05-18 (×2): qty 1
  Filled 2020-05-18: qty 21
  Filled 2020-05-18: qty 1
  Filled 2020-05-18: qty 21
  Filled 2020-05-18 (×2): qty 1
  Filled 2020-05-18: qty 21
  Filled 2020-05-18 (×2): qty 1

## 2020-05-18 NOTE — Social Work (Signed)
CSW met with Pt at bedside per request of Pt.   CSW provided supportive listening as Pt discussed current hospitalization.  CSW also rescheduled Pts PCP appointment at [facility] as Pt is expected to still be inpatient at that time. Rescheduled appointment has been added to AVS and Pt has been updated.
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
## 2020-05-18 NOTE — Progress Notes (Signed)
Pt accepted to [facility], bed 402-2   Denzil Magnuson, NP is the accepting provider.    Dr. Jola Babinski is the attending provider.    Call report to [phone]    [facility] ED notified.     Pt is voluntary and will be transported by [employer]  Pt is scheduled to arrive at [facility] at 8pm.    Wells Guiles, MSW, LCSW, LCAS Clinical Social Worker II Disposition CSW [phone]

## 2020-05-19 ENCOUNTER — Encounter (HOSPITAL_COMMUNITY): Payer: Self-pay | Admitting: Behavioral Health

## 2020-05-19 ENCOUNTER — Inpatient Hospital Stay (INDEPENDENT_AMBULATORY_CARE_PROVIDER_SITE_OTHER): Payer: Self-pay | Admitting: Primary Care

## 2020-05-19 ENCOUNTER — Other Ambulatory Visit: Payer: Self-pay

## 2020-05-19 DIAGNOSIS — F332 Major depressive disorder, recurrent severe without psychotic features: Secondary | ICD-10-CM | POA: Diagnosis not present

## 2020-05-19 LAB — GLUCOSE, CAPILLARY
Glucose-Capillary: 61 mg/dL — ABNORMAL LOW (ref 70–99)
Glucose-Capillary: 95 mg/dL (ref 70–99)
Glucose-Capillary: 91 mg/dL (ref 70–99)
Glucose-Capillary: 126 mg/dL — ABNORMAL HIGH (ref 70–99)

## 2020-05-19 MED ORDER — TRAMADOL HCL 50 MG PO TABS
100.0000 mg | ORAL_TABLET | Freq: Four times a day (QID) | ORAL | Status: DC | PRN
  Administered 2020-05-19 – 2020-05-20 (×3): 100 mg via ORAL
  Filled 2020-05-19 (×3): qty 2

## 2020-05-19 MED ORDER — CHOLECALCIFEROL 10 MCG (400 UNIT) PO TABS
400.0000 [IU] | ORAL_TABLET | Freq: Every day | ORAL | Status: DC
  Administered 2020-05-19 – 2020-05-20 (×2): 400 [IU] via ORAL
  Filled 2020-05-19 (×2): qty 1
  Filled 2020-05-19: qty 7
  Filled 2020-05-19: qty 1

## 2020-05-19 MED ORDER — DULOXETINE HCL 60 MG PO CPEP
60.0000 mg | ORAL_CAPSULE | Freq: Every day | ORAL | Status: DC
  Administered 2020-05-19 – 2020-05-20 (×2): 60 mg via ORAL
  Filled 2020-05-19: qty 7
  Filled 2020-05-19 (×3): qty 1

## 2020-05-19 MED ORDER — ASCORBIC ACID 500 MG PO TABS
1000.0000 mg | ORAL_TABLET | Freq: Every day | ORAL | Status: DC
  Administered 2020-05-19 – 2020-05-20 (×2): 1000 mg via ORAL
  Filled 2020-05-19: qty 2
  Filled 2020-05-19: qty 14
  Filled 2020-05-19 (×2): qty 2

## 2020-05-19 MED ORDER — PANTOPRAZOLE SODIUM 40 MG PO TBEC
40.0000 mg | DELAYED_RELEASE_TABLET | Freq: Every day | ORAL | Status: DC
  Administered 2020-05-19 – 2020-05-20 (×2): 40 mg via ORAL
  Filled 2020-05-19 (×2): qty 1
  Filled 2020-05-19: qty 7
  Filled 2020-05-19: qty 1

## 2020-05-19 MED ORDER — INSULIN ASPART 100 UNIT/ML ~~LOC~~ SOLN: 0.0000 [IU] | Freq: Three times a day (TID) | SUBCUTANEOUS | Status: DC
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
## 2020-05-19 NOTE — Progress Notes (Signed)
Recreation Therapy Notes  Date:  10.13.21 Time: 0930 Location: 300 Group Room  Group Topic: Stress Management  Goal Area(s) Addresses:  Patient will identify positive stress management techniques. Patient will identify benefits of using stress management post d/c.  Intervention: Stress Management  Activity: Guided Imagery.  LRT read a script that led patients on a journey to envision their peaceful place.  Patients were imagine all the things that made their place peaceful to them.  Education:  Stress Management, Discharge Planning.   Education Outcome: Acknowledges Education  Clinical Observations/Feedback:  Pt did not attend activity.    Shon Indelicato, LRT/CTRS         Laurence Folz A 05/19/2020 10:11 AM 

## 2020-05-19 NOTE — [facility] Group Notes (Signed)
[facility] LCSW Group Therapy  05/19/2020 11:44 AM  Type of Therapy:  Self Care for Depression   Participation Level:  Active  Participation Quality:  Appropriate and Attentive  Affect:  Appropriate  Cognitive:  Appropriate  Insight:  Developing/Improving  Engagement in Therapy:  Developing/Improving  Modes of Intervention:  Discussion  Summary of Progress/Problems: Lukasz participated in group and shared that he likes to cook and get his hair cut as a form of self-care for depression.  Leul followed along with the work sheet and even discussed things he heard in the group with the CSW after the group was complete.   Metro Kung Jocob Dambach 05/19/2020, 11:44 AM
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
## 2020-05-19 NOTE — [facility] Counselor (Signed)
CSW spoke with Debbora Presto from the [facility].  Mr. West Bali states that Nussen has been in the program before and they know him well.  Mr. West Bali stated that he is going to review the information provided to see if Ermias can attend the program again.  The program would allow Jerrald to live at their campus and will work to help him find placement once completed. CSW will continue to monitor and work towards finding placement.

## 2020-05-19 NOTE — Progress Notes (Addendum)
   05/18/20 2130  Psych Admission Type (Psych Patients Only)  Admission Status Voluntary  Psychosocial Assessment  Patient Complaints Anxiety;Hopelessness;Irritability;Restlessness;Other (Comment) (frustration; tiredness)  Eye Contact Fair  Facial Expression Animated  Affect Appropriate to circumstance  Speech Logical/coherent  Interaction Assertive  Motor Activity Slow  Appearance/Hygiene Unremarkable;In scrubs  Behavior Characteristics Cooperative;Anxious  Mood Depressed;Anxious  Thought Process  Coherency WDL  Content WDL  Delusions None reported or observed  Perception WDL  Hallucination None reported or observed  Judgment Poor  Confusion None  Danger to Self  Current suicidal ideation? Denies  Danger to Others  Danger to Others None reported or observed   Pt endorses hopelessness, tiredness, anxiety, irritability, restlessness, frustration. States that his sister has to help him get ready in the morning by setting up what he will need to bathe and dress himself. Pt feels like he is a burden to her since she has 5 kids and he is sleeping on her couch. Pt wants medication but ran out of his samples and couldn't afford to buy more. Pt has been self-medicating with marijuana but wants resources to stay on his prescribed medication.

## 2020-05-19 NOTE — Tx Team (Signed)
Interdisciplinary Treatment and Diagnostic Plan Update  05/19/2020 Time of Session: 9:25am Daniel [name] MRN: [id number]  Principal Diagnosis: <principal problem not specified>  Secondary Diagnoses: Active Problems:   MDD (major depressive disorder)   Current Medications:  Current Facility-Administered Medications  Medication Dose Route Frequency Provider Last Rate Last Admin  . apixaban (ELIQUIS) tablet 5 mg  5 mg Oral BID Mordecai Maes, NP   5 mg at 05/19/20 0855  . DULoxetine (CYMBALTA) DR capsule 40 mg  40 mg Oral Daily Mordecai Maes, NP   40 mg at 05/19/20 0855  . gabapentin (NEURONTIN) capsule 400 mg  400 mg Oral TID Mordecai Maes, NP   400 mg at 05/19/20 0855  . hydrOXYzine (ATARAX/VISTARIL) tablet 25 mg  25 mg Oral TID PRN Mordecai Maes, NP   25 mg at 05/18/20 2304  . metFORMIN (GLUCOPHAGE) tablet 500 mg  500 mg Oral BID Mordecai Maes, NP      . methocarbamol (ROBAXIN) tablet 1,000 mg  1,000 mg Oral Q8H PRN Mordecai Maes, NP   1,000 mg at 05/19/20 0858  . QUEtiapine (SEROQUEL) tablet 200 mg  200 mg Oral QHS Mordecai Maes, NP      . traZODone (DESYREL) tablet 50 mg  50 mg Oral QHS PRN Mordecai Maes, NP   50 mg at 05/18/20 2305   PTA Medications: Medications Prior to Admission  Medication Sig Dispense Refill Last Dose  . apixaban (ELIQUIS) 5 MG TABS tablet Take 1 tablet (5 mg total) by mouth 2 (two) times daily. 60 tablet 0   . ascorbic acid (VITAMIN C) 1000 MG tablet Take 1,000 mg by mouth daily.     . cholecalciferol (VITAMIN D3) 10 MCG (400 UNIT) TABS tablet Take 1 tablet (400 Units total) by mouth daily. (Patient not taking: Reported on 05/17/2020) 30 tablet 0   . DULoxetine 40 MG CPEP Take 40 mg by mouth daily. 30 capsule 0   . gabapentin (NEURONTIN) 400 MG capsule Take 1 capsule (400 mg total) by mouth 3 (three) times daily. 90 capsule 0   . hydrOXYzine (ATARAX/VISTARIL) 25 MG tablet Take 1 tablet (25 mg total) by mouth 3 (three) times daily as  needed for itching or anxiety. 30 tablet 0   . metFORMIN (GLUCOPHAGE) 500 MG tablet Take 1 tablet (500 mg total) by mouth 2 (two) times daily. 60 tablet 0   . methocarbamol (ROBAXIN) 500 MG tablet Take 2 tablets (1,000 mg total) by mouth every 8 (eight) hours as needed for muscle spasms. 15 tablet 0   . neomycin-bacitracin-polymyxin (NEOSPORIN) OINT Apply 1 application topically 2 (two) times daily.     . nicotine polacrilex (NICORETTE) 2 MG gum Take 1 each (2 mg total) by mouth as needed for smoking cessation. (Patient not taking: Reported on 05/17/2020) 100 tablet 0   . Oxycodone HCl 10 MG TABS Take 5-10 mg by mouth every 6 (six) hours as needed (moderate to severe pain).     . pantoprazole (PROTONIX) 40 MG tablet Take 1 tablet (40 mg total) by mouth daily. (Patient not taking: Reported on 05/17/2020) 30 tablet 0   . QUEtiapine (SEROQUEL) 200 MG tablet Take 1 tablet (200 mg total) by mouth at bedtime. 30 tablet 0   . traMADol (ULTRAM) 50 MG tablet Take 50-100 mg by mouth every 6 (six) hours as needed (for pain).     . traZODone (DESYREL) 50 MG tablet Take 1 tablet (50 mg total) by mouth at bedtime as needed for sleep. 15 tablet 0  Patient Stressors: Financial difficulties Health problems Medication change or noncompliance Occupational concerns  Patient Strengths: Average or above average intelligence Communication skills Motivation for treatment/growth Supportive family/friends  Treatment Modalities: Medication Management, Group therapy, Case management,  1 to 1 session with clinician, Psychoeducation, Recreational therapy.   Physician Treatment Plan for Primary Diagnosis: <principal problem not specified> Long Term Goal(s):     Short Term Goals:    Medication Management: Evaluate patient's response, side effects, and tolerance of medication regimen.  Therapeutic Interventions: 1 to 1 sessions, Unit Group sessions and Medication administration.  Evaluation of Outcomes: Not  Met  Physician Treatment Plan for Secondary Diagnosis: Active Problems:   MDD (major depressive disorder)  Long Term Goal(s):     Short Term Goals:       Medication Management: Evaluate patient's response, side effects, and tolerance of medication regimen.  Therapeutic Interventions: 1 to 1 sessions, Unit Group sessions and Medication administration.  Evaluation of Outcomes: Not Met   RN Treatment Plan for Primary Diagnosis: <principal problem not specified> Long Term Goal(s): Knowledge of disease and therapeutic regimen to maintain health will improve  Short Term Goals: Ability to remain free from injury will improve, Ability to participate in decision making will improve, Ability to verbalize feelings will improve, Ability to identify and develop effective coping behaviors will improve and Compliance with prescribed medications will improve  Medication Management: RN will administer medications as ordered by provider, will assess and evaluate patient's response and provide education to patient for prescribed medication. RN will report any adverse and/or side effects to prescribing provider.  Therapeutic Interventions: 1 on 1 counseling sessions, Psychoeducation, Medication administration, Evaluate responses to treatment, Monitor vital signs and CBGs as ordered, Perform/monitor CIWA, COWS, AIMS and Fall Risk screenings as ordered, Perform wound care treatments as ordered.  Evaluation of Outcomes: Not Met   LCSW Treatment Plan for Primary Diagnosis: <principal problem not specified> Long Term Goal(s): Safe transition to appropriate next level of care at discharge, Engage patient in therapeutic group addressing interpersonal concerns.  Short Term Goals: Engage patient in aftercare planning with referrals and resources, Increase social support, Facilitate acceptance of mental health diagnosis and concerns, Identify triggers associated with mental health/substance abuse issues and  Increase skills for wellness and recovery  Therapeutic Interventions: Assess for all discharge needs, 1 to 1 time with Social worker, Explore available resources and support systems, Assess for adequacy in community support network, Educate family and significant other(s) on suicide prevention, Complete Psychosocial Assessment, Interpersonal group therapy.  Evaluation of Outcomes: Not Met   Progress in Treatment: Attending groups: No. Participating in groups: No. Taking medication as prescribed: Yes. Toleration medication: Yes. Family/Significant other contact made: No, will contact:  If consents are given  Patient understands diagnosis: Yes. and No. Discussing patient identified problems/goals with staff: Yes. Medical problems stabilized or resolved: Yes. Denies suicidal/homicidal ideation: Yes. Issues/concerns per patient self-inventory: No.   New problem(s) identified: No, Describe:  None  New Short Term/Long Term Goal(s): medication stabilization, elimination of SI thoughts, development of comprehensive mental wellness plan.   Patient Goals:  "To find placement and some stability"   Discharge Plan or Barriers: Patient recently admitted. CSW will continue to follow and assess for appropriate referrals and possible discharge planning.   Reason for Continuation of Hospitalization: Depression Hallucinations Medication stabilization  Estimated Length of Stay: 3 to 5 days   Attendees: Patient: Daniel [name] 05/19/2020   Physician:  05/19/2020   Nursing:  05/19/2020   RN Care Manager:  Harriett Sine, NP 05/19/2020   Social Worker: Darletta Moll, LCSW 05/19/2020   Recreational Therapist:  05/19/2020   Other:  05/19/2020  Other:  05/19/2020   Other: 05/19/2020      Scribe for Treatment Team: Darleen Crocker, [place] 05/19/2020 9:44 AM

## 2020-05-20 DIAGNOSIS — F332 Major depressive disorder, recurrent severe without psychotic features: Secondary | ICD-10-CM | POA: Diagnosis not present

## 2020-05-20 LAB — GLUCOSE, CAPILLARY: Glucose-Capillary: 75 mg/dL (ref 70–99)

## 2020-05-20 MED ORDER — HYDROXYZINE HCL 25 MG PO TABS
25.0000 mg | ORAL_TABLET | Freq: Three times a day (TID) | ORAL | 0 refills | Status: DC | PRN
Start: 2020-05-20 — End: 2020-05-25

## 2020-05-20 MED ORDER — METFORMIN HCL 500 MG PO TABS
500.0000 mg | ORAL_TABLET | Freq: Two times a day (BID) | ORAL | 0 refills | Status: DC
Start: 2020-05-20 — End: 2020-05-25

## 2020-05-20 MED ORDER — PANTOPRAZOLE SODIUM 40 MG PO TBEC
40.0000 mg | DELAYED_RELEASE_TABLET | Freq: Every day | ORAL | 0 refills | Status: DC
Start: 2020-05-20 — End: 2020-05-25

## 2020-05-20 MED ORDER — DULOXETINE HCL 60 MG PO CPEP: 60.0000 mg | ORAL_CAPSULE | Freq: Every day | ORAL | 0 refills | Status: DC

## 2020-05-20 MED ORDER — GABAPENTIN 400 MG PO CAPS
400.0000 mg | ORAL_CAPSULE | Freq: Three times a day (TID) | ORAL | 0 refills | Status: DC
Start: 2020-05-20 — End: 2020-05-25

## 2020-05-20 MED ORDER — QUETIAPINE FUMARATE 200 MG PO TABS
200.0000 mg | ORAL_TABLET | Freq: Every day | ORAL | 0 refills | Status: DC
Start: 2020-05-20 — End: 2020-05-25

## 2020-05-20 MED ORDER — TRAMADOL HCL 50 MG PO TABS
50.0000 mg | ORAL_TABLET | Freq: Two times a day (BID) | ORAL | 0 refills | Status: DC | PRN
Start: 2020-05-20 — End: 2020-05-25

## 2020-05-20 NOTE — Progress Notes (Signed)
  [facility] Adult Case Management Discharge Plan :  Will you be returning to the same living situation after discharge:  No. At discharge, do you have transportation home?: Yes,  Elige Radon with [employer]  Do you have the ability to pay for your medications: No.  Release of information consent forms completed and in the chart;  Patient's signature needed at discharge.  Patient to Follow up at:  Follow-up Information    [facility] Follow up on 05/27/2020.   Why: Appointment is scheduled for 05/27/20 at 3:50pm.  Contact information: [address] [place] [address] [phone]              Next level of care provider has access to [facility] Link:no  Safety Planning and Suicide Prevention discussed: Yes,  with Patient   Have you used any form of tobacco in the last 30 days? (Cigarettes, Smokeless Tobacco, Cigars, and/or Pipes): Yes  Has patient been referred to the Quitline?: Patient refused referral  Patient has been referred for addiction treatment: Yes  Aram Beecham, LCSWA 05/20/2020, 9:49 AM
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
## 2020-05-20 NOTE — Progress Notes (Signed)
Pt was rude and argumentative with staff this morning about using the tub room and staying in his designated area until he discharges. It took heavy redirection from the nurse and tech to get him back on the hallway as well as follow the discharge process.

## 2020-05-20 NOTE — [facility] Suicide Risk Assessment (Signed)
[facility] INPATIENT:  Family/Significant Other Suicide Prevention Education  Suicide Prevention Education:  Contact Attempts: Baird Lyons [phone] (Sister) has been identified by the patient as the family member/significant other with whom the patient will be residing, and identified as the person(s) who will aid the patient in the event of a mental health crisis.  With written consent from the patient, two attempts were made to provide suicide prevention education, prior to and/or following the patient's discharge.  We were unsuccessful in providing suicide prevention education.  A suicide education pamphlet was given to the patient to share with family/significant other.  Date and time of first attempt: 05/19/2020 at 3:30pm Date and time of second attempt: 05/20/2020 at 10:00am  Pt also provided the number [phone] (The person that answered this phone was a male who stated that there was no Baird Lyons at that phone number).  There was also no answer or voicemail provided at the previous number listed above. Both numbers were tried twice.  SPE was completed with the patient.   Metro Kung Shawntrice Salle 05/20/2020, 9:58 AM

## 2020-05-20 NOTE — Progress Notes (Signed)
Patient ID: Daniel [name], male   DOB: 1990-10-20, 29 y.o.   MRN: [id number] Patient discharged to home/self care in the presence of his rehab facility.  Patient denies SI, HI and AVH upon discharge.  Patient was eager to discharge to continue his recover in the community.  Patient acknowledged understanding of all discharge instructions and receipt of all personal belongings.

## 2020-05-20 NOTE — Plan of Care (Signed)
  Problem: Education: Goal: Knowledge of [facility] Education information/materials will improve Outcome: Adequate for Discharge Goal: Emotional status will improve Outcome: Adequate for Discharge Goal: Mental status will improve Outcome: Adequate for Discharge Goal: Verbalization of understanding the information provided will improve Outcome: Adequate for Discharge   Problem: Activity: Goal: Interest or engagement in activities will improve Outcome: Adequate for Discharge Goal: Sleeping patterns will improve Outcome: Adequate for Discharge   Problem: Coping: Goal: Ability to verbalize frustrations and anger appropriately will improve Outcome: Adequate for Discharge Goal: Ability to demonstrate self-control will improve Outcome: Adequate for Discharge   Problem: Health Behavior/Discharge Planning: Goal: Identification of resources available to assist in meeting health care needs will improve Outcome: Adequate for Discharge Goal: Compliance with treatment plan for underlying cause of condition will improve Outcome: Adequate for Discharge   Problem: Physical Regulation: Goal: Ability to maintain clinical measurements within normal limits will improve Outcome: Adequate for Discharge   Problem: Safety: Goal: Periods of time without injury will increase Outcome: Adequate for Discharge  Patient discharged to rehabilitation facility

## 2020-05-20 NOTE — BHH Suicide Risk Assessment (Signed)
[facility] Discharge Suicide Risk Assessment   Principal Problem: <principal problem not specified> Discharge Diagnoses: Active Problems:   MDD (major depressive disorder)   Total Time spent with patient: 20 minutes  Musculoskeletal: Strength & Muscle Tone: within normal limits Gait & Station: normal Patient leans: N/A  Psychiatric Specialty Exam: Review of Systems  Neurological: Positive for weakness.  All other systems reviewed and are negative.   Blood pressure (!) 155/103, pulse 87, temperature 97.6 F (36.4 C), temperature source Oral, height 5\' 11"  (1.803 m), weight 72.6 kg, SpO2 100 %.Body mass index is 22.32 kg/m.  General Appearance: Casual  Eye Contact::  Good  Speech:  Normal Rate409  Volume:  Normal  Mood:  Euthymic  Affect:  Appropriate  Thought Process:  Coherent and Descriptions of Associations: Intact  Orientation:  Full (Time, Place, and Person)  Thought Content:  Logical  Suicidal Thoughts:  No  Homicidal Thoughts:  No  Memory:  Immediate;   Fair Recent;   Fair Remote;   Fair  Judgement:  Intact  Insight:  Fair  Psychomotor Activity:  Normal  Concentration:  Fair  Recall:  [phone] of Knowledge:Fair  Language: Good  Akathisia:  Negative  Handed:  Right  AIMS (if indicated):     Assets:  Desire for Improvement Resilience  Sleep:  Number of Hours: 6.5  Cognition: WNL  ADL's:  Intact   Mental Status Per Nursing Assessment::   On Admission:  NA  Demographic Factors:  Male, Divorced or widowed, Low socioeconomic status and Unemployed  Loss Factors: Decrease in vocational status, Decline in physical health and Financial problems/change in socioeconomic status  Historical Factors: Impulsivity  Risk Reduction Factors:   Sense of responsibility to family  Continued Clinical Symptoms:  Bipolar Disorder:   Bipolar II Depression:   Comorbid alcohol abuse/dependence Impulsivity Alcohol/Substance Abuse/Dependencies  Cognitive Features That  Contribute To Risk:  None    Suicide Risk:  Minimal: No identifiable suicidal ideation.  Patients presenting with no risk factors but with morbid ruminations; may be classified as minimal risk based on the severity of the depressive symptoms   Follow-up Information    [facility] Follow up on 05/27/2020.   Why: Appointment is scheduled for 05/27/20 at 3:50pm.  Contact information: [address] [place] [phone]              Plan Of Care/Follow-up recommendations:  Activity:  ad lib  [phone], MD 05/20/2020, 7:50 AM

## 2020-05-20 NOTE — Progress Notes (Signed)
Pt came to staff said he need some stuff,"staff asked what stuff does he need. Pt said I need towels and wash cloth. Staff asked how many towels pt could not answer. Staff got the towels and wash cloth. Pt was very rude he need other stuff. Staff asked what other stuff. Pt said he need soap and everything to take a shower. Staff went to get the soap lotion and anti perspirant. Pt said he want to go to the tube. Staff remind pt there is a shower in his room. Pt said rudely he do not want to go there he want to use the tube. Staff remind pt middle shift change a staff member has to be in the bathroom with him he cannot go alone. Pt said he does it all the time. Staff remind pt he can go after shift change.

## 2020-05-23 ENCOUNTER — Other Ambulatory Visit: Payer: Self-pay

## 2020-05-23 ENCOUNTER — Encounter (HOSPITAL_COMMUNITY): Payer: Self-pay | Admitting: Emergency Medicine

## 2020-05-23 ENCOUNTER — Emergency Department (HOSPITAL_COMMUNITY)
Admission: EM | Admit: 2020-05-23 | Discharge: 2020-05-24 | Disposition: A | Discharge status: Other Acute Inpatient Hospital | Payer: Self-pay | Attending: Emergency Medicine | Admitting: Emergency Medicine

## 2020-05-23 DIAGNOSIS — F339 Major depressive disorder, recurrent, unspecified: Secondary | ICD-10-CM | POA: Insufficient documentation

## 2020-05-23 DIAGNOSIS — F1414 Cocaine abuse with cocaine-induced mood disorder: Secondary | ICD-10-CM | POA: Diagnosis present

## 2020-05-23 DIAGNOSIS — F329 Major depressive disorder, single episode, unspecified: Secondary | ICD-10-CM | POA: Diagnosis present

## 2020-05-23 DIAGNOSIS — F1590 Other stimulant use, unspecified, uncomplicated: Secondary | ICD-10-CM | POA: Insufficient documentation

## 2020-05-23 DIAGNOSIS — F17210 Nicotine dependence, cigarettes, uncomplicated: Secondary | ICD-10-CM | POA: Insufficient documentation

## 2020-05-23 DIAGNOSIS — F322 Major depressive disorder, single episode, severe without psychotic features: Secondary | ICD-10-CM | POA: Diagnosis present

## 2020-05-23 DIAGNOSIS — F4323 Adjustment disorder with mixed anxiety and depressed mood: Secondary | ICD-10-CM | POA: Insufficient documentation

## 2020-05-23 DIAGNOSIS — R45851 Suicidal ideations: Secondary | ICD-10-CM | POA: Insufficient documentation

## 2020-05-23 DIAGNOSIS — Z20822 Contact with and (suspected) exposure to covid-19: Secondary | ICD-10-CM | POA: Insufficient documentation

## 2020-05-23 DIAGNOSIS — F4310 Post-traumatic stress disorder, unspecified: Secondary | ICD-10-CM | POA: Diagnosis present

## 2020-05-23 DIAGNOSIS — F1220 Cannabis dependence, uncomplicated: Secondary | ICD-10-CM | POA: Diagnosis present

## 2020-05-23 DIAGNOSIS — F3164 Bipolar disorder, current episode mixed, severe, with psychotic features: Secondary | ICD-10-CM | POA: Diagnosis present

## 2020-05-23 LAB — COMPREHENSIVE METABOLIC PANEL
ALT: 13 U/L (ref 0–44)
AST: 23 U/L (ref 15–41)
Albumin: 4 g/dL (ref 3.5–5.0)
Alkaline Phosphatase: 88 U/L (ref 38–126)
Anion gap: 10 (ref 5–15)
BUN: 14 mg/dL (ref 6–20)
CO2: 29 mmol/L (ref 22–32)
Calcium: 9.3 mg/dL (ref 8.9–10.3)
Chloride: 99 mmol/L (ref 98–111)
Creatinine, Ser: 0.93 mg/dL (ref 0.61–1.24)
GFR, Estimated: >60 mL/min (ref >60)
Glucose, Bld: 86 mg/dL (ref 70–99)
Potassium: 3.7 mmol/L (ref 3.5–5.1)
Sodium: 138 mmol/L (ref 135–145)
Total Bilirubin: 1.1 mg/dL (ref 0.3–1.2)
Total Protein: 7.9 g/dL (ref 6.5–8.1)

## 2020-05-23 LAB — CBC
HCT: 44.1 % (ref 39.0–52.0)
Hemoglobin: 14.3 g/dL (ref 13.0–17.0)
MCH: 29.7 pg (ref 26.0–34.0)
MCHC: 32.4 g/dL (ref 30.0–36.0)
MCV: 91.5 fL (ref 80.0–100.0)
Platelets: 246 10*3/uL (ref 150–400)
RBC: 4.82 MIL/uL (ref 4.22–5.81)
RDW: 14.3 % (ref 11.5–15.5)
WBC: 8.2 10*3/uL (ref 4.0–10.5)
nRBC: 0 % (ref 0.0–0.2)

## 2020-05-23 LAB — RAPID URINE DRUG SCREEN, HOSP PERFORMED
Amphetamines: NOT DETECTED
Barbiturates: NOT DETECTED
Benzodiazepines: NOT DETECTED
Cocaine: NOT DETECTED
Opiates: NOT DETECTED
Tetrahydrocannabinol: POSITIVE — AB

## 2020-05-23 LAB — SALICYLATE LEVEL: Salicylate Lvl: <7 mg/dL — ABNORMAL LOW (ref 7.0–30.0)

## 2020-05-23 LAB — ETHANOL: Alcohol, Ethyl (B): <10 mg/dL (ref <10)

## 2020-05-23 LAB — ACETAMINOPHEN LEVEL: Acetaminophen (Tylenol), Serum: <10 ug/mL — ABNORMAL LOW (ref 10–30)

## 2020-05-23 LAB — RESPIRATORY PANEL BY RT PCR (FLU A&B, COVID)
Influenza A by PCR: NEGATIVE
Influenza B by PCR: NEGATIVE
SARS Coronavirus 2 by RT PCR: NEGATIVE

## 2020-05-23 MED ORDER — HYDROXYZINE HCL 25 MG PO TABS: 25.0000 mg | ORAL_TABLET | Freq: Three times a day (TID) | ORAL | Status: DC | PRN

## 2020-05-23 MED ORDER — TRAMADOL HCL 50 MG PO TABS
50.0000 mg | ORAL_TABLET | Freq: Once | ORAL | Status: AC
  Administered 2020-05-23: 50 mg via ORAL
  Filled 2020-05-23: qty 1

## 2020-05-23 MED ORDER — DULOXETINE HCL 30 MG PO CPEP: 60.0000 mg | ORAL_CAPSULE | Freq: Every day | ORAL | Status: DC

## 2020-05-23 MED ORDER — QUETIAPINE FUMARATE 100 MG PO TABS
200.0000 mg | ORAL_TABLET | Freq: Every day | ORAL | Status: DC
  Administered 2020-05-23: 200 mg via ORAL
  Filled 2020-05-23: qty 2

## 2020-05-23 MED ORDER — GABAPENTIN 400 MG PO CAPS
400.0000 mg | ORAL_CAPSULE | Freq: Three times a day (TID) | ORAL | Status: DC
  Administered 2020-05-23: 400 mg via ORAL
  Filled 2020-05-23: qty 1
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
## 2020-05-23 NOTE — ED Triage Notes (Signed)
Pt reports SI worsening over the last week. Hx of same. He reports that he was recently in [facility], but feels that he needs his meds adjusted. States that he is severely depressed and suicidal. He has been on Cymbalta for 4 weeks and states that he hasn't improved. No plan. Denies drug use.

## 2020-05-24 ENCOUNTER — Other Ambulatory Visit: Payer: Self-pay

## 2020-05-24 ENCOUNTER — Ambulatory Visit (HOSPITAL_COMMUNITY)
Admission: EM | Admit: 2020-05-24 | Discharge: 2020-05-25 | Disposition: A | Discharge status: Home / Self Care | Payer: No Payment, Other | Attending: Behavioral Health | Admitting: Behavioral Health

## 2020-05-24 ENCOUNTER — Encounter (HOSPITAL_COMMUNITY): Payer: Self-pay | Admitting: Emergency Medicine

## 2020-05-24 DIAGNOSIS — Z20822 Contact with and (suspected) exposure to covid-19: Secondary | ICD-10-CM | POA: Insufficient documentation

## 2020-05-24 DIAGNOSIS — F322 Major depressive disorder, single episode, severe without psychotic features: Secondary | ICD-10-CM

## 2020-05-24 DIAGNOSIS — F332 Major depressive disorder, recurrent severe without psychotic features: Secondary | ICD-10-CM | POA: Insufficient documentation

## 2020-05-24 DIAGNOSIS — F17210 Nicotine dependence, cigarettes, uncomplicated: Secondary | ICD-10-CM | POA: Insufficient documentation

## 2020-05-24 MED ORDER — HYDROXYZINE HCL 25 MG PO TABS
25.0000 mg | ORAL_TABLET | Freq: Three times a day (TID) | ORAL | Status: DC | PRN
  Administered 2020-05-25: 25 mg via ORAL
  Filled 2020-05-24 (×2): qty 10
  Filled 2020-05-24: qty 1
  Filled 2020-05-24: qty 10

## 2020-05-24 MED ORDER — CHOLECALCIFEROL 10 MCG (400 UNIT) PO TABS
400.0000 [IU] | ORAL_TABLET | Freq: Every day | ORAL | Status: DC
  Administered 2020-05-24 – 2020-05-25 (×2): 400 [IU] via ORAL
  Filled 2020-05-24 (×2): qty 1

## 2020-05-24 MED ORDER — GABAPENTIN 400 MG PO CAPS
400.0000 mg | ORAL_CAPSULE | Freq: Three times a day (TID) | ORAL | Status: DC
  Administered 2020-05-24 – 2020-05-25 (×4): 400 mg via ORAL
  Filled 2020-05-24 (×3): qty 1
  Filled 2020-05-24: qty 21
  Filled 2020-05-24: qty 1

## 2020-05-24 MED ORDER — DULOXETINE HCL 60 MG PO CPEP
60.0000 mg | ORAL_CAPSULE | Freq: Every day | ORAL | Status: DC
  Administered 2020-05-24 – 2020-05-25 (×2): 60 mg via ORAL
  Filled 2020-05-24: qty 7
  Filled 2020-05-24 (×2): qty 1

## 2020-05-24 MED ORDER — MAGNESIUM HYDROXIDE 400 MG/5ML PO SUSP: 30.0000 mL | Freq: Every day | ORAL | Status: DC | PRN

## 2020-05-24 MED ORDER — QUETIAPINE FUMARATE ER 200 MG PO TB24
200.0000 mg | ORAL_TABLET | Freq: Every day | ORAL | Status: DC
  Administered 2020-05-24: 200 mg via ORAL
  Filled 2020-05-24: qty 1

## 2020-05-24 MED ORDER — QUETIAPINE FUMARATE ER 200 MG PO TB24
200.0000 mg | ORAL_TABLET | Freq: Once | ORAL | Status: AC
  Administered 2020-05-24: 200 mg via ORAL
  Filled 2020-05-24: qty 1

## 2020-05-24 MED ORDER — ALUM & MAG HYDROXIDE-SIMETH 200-200-20 MG/5ML PO SUSP: 30.0000 mL | ORAL | Status: DC | PRN

## 2020-05-24 NOTE — ED Notes (Signed)
Vitals will be retaken at 8

## 2020-05-24 NOTE — ED Notes (Signed)
Pt resting with eyes closed. Safety maintained. 

## 2020-05-24 NOTE — BHH Counselor (Signed)
Elenore Paddy, NP, recommends continual observation for safety and stabilization with psych reassessment in the AM. [place], RN, informed of acceptance. Joanie Coddington, RN, [facility] informed of acceptance.
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
## 2020-05-24 NOTE — ED Provider Notes (Addendum)
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   Performed at [facility], [address] [place], [place] [address]  . WBC 05/16/2020 8.5  4.0 - 10.5 K/uL Final  . RBC 05/16/2020 4.62  4.22 - 5.81 MIL/uL Final  . Hemoglobin 05/16/2020 13.9  13.0 - 17.0 g/dL Final  . HCT 32/44/0102 42.3  39 - 52 % Final  . MCV 05/16/2020 91.6  80.0 - 100.0 fL Final  .  MCH 05/16/2020 30.1  26.0 - 34.0 pg Final  . MCHC 05/16/2020 32.9  30.0 - 36.0 g/dL Final  . RDW 72/53/6644 14.5  11.5 - 15.5 % Final  . Platelets 05/16/2020 280  150 - 400 K/uL Final  . nRBC 05/16/2020 0.0  0.0 - 0.2 % Final   Performed at [facility], [address], [place], [place] [address]  . Troponin I (High Sensitivity) 05/16/2020 <2  <18 ng/L Final   Comment: (NOTE) Elevated high sensitivity troponin I (hsTnI) values and significant  changes across serial measurements may suggest ACS but many other  chronic and acute conditions are known to elevate hsTnI results.  Refer to the "Links" section for chest pain algorithms and additional  guidance. Performed at [facility], [address] [place], [place] [address]   Admission on 05/09/2020, Discharged on 05/09/2020  Component Date Value Ref Range Status  . Sodium 05/09/2020 136  135 - 145 mmol/L Final  . Potassium 05/09/2020  4.8  3.5 - 5.1 mmol/L Final  . Chloride 05/09/2020 97* 98 - 111 mmol/L Final  . CO2 05/09/2020 28  22 - 32 mmol/L Final  . Glucose, Bld 05/09/2020 83  70 - 99 mg/dL Final   Glucose reference range applies only to samples taken after fasting for at least 8 hours.  . BUN 05/09/2020 12  6 - 20 mg/dL Final  . Creatinine, Ser 05/09/2020 0.83  0.61 - 1.24 mg/dL Final  . Calcium 40/98/1191 9.5  8.9 - 10.3 mg/dL Final  . GFR calc non Af Amer 05/09/2020 >60  >60 mL/min Final  . GFR calc Af Amer 05/09/2020 >60  >60 mL/min Final  . Anion gap 05/09/2020 11  5 - 15 Final   Performed at [facility], [address], [place], [place] [address]  . WBC 05/09/2020 13.1* 4.0 - 10.5 K/uL Final  . RBC 05/09/2020 4.55  4.22 - 5.81 MIL/uL Final  . Hemoglobin 05/09/2020 13.7  13.0 - 17.0 g/dL Final  . HCT 56/21/3086 41.1  39 - 52 % Final  . MCV 05/09/2020 90.3  80.0 - 100.0 fL Final  . MCH 05/09/2020 30.1  26.0 - 34.0 pg Final  . MCHC 05/09/2020 33.3  30.0 - 36.0 g/dL  Final  . RDW 57/84/6962 14.1  11.5 - 15.5 % Final  . Platelets 05/09/2020 387  150 - 400 K/uL Final  . nRBC 05/09/2020 0.0  0.0 - 0.2 % Final  . Neutrophils Relative % 05/09/2020 74  % Final  . Neutro Abs 05/09/2020 9.9* 1.7 - 7.7 K/uL Final  . Lymphocytes Relative 05/09/2020 16  % Final  . Lymphs Abs 05/09/2020 2.1  0.7 - 4.0 K/uL Final  . Monocytes Relative 05/09/2020 7  % Final  . Monocytes Absolute 05/09/2020 0.9  0.1 - 1.0 K/uL Final  . Eosinophils Relative 05/09/2020 1  % Final  . Eosinophils Absolute 05/09/2020 0.1  0.0 - 0.5 K/uL Final  . Basophils Relative 05/09/2020 1  % Final  . Basophils Absolute 05/09/2020 0.1  0.0 - 0.1 K/uL Final  . Immature Granulocytes 05/09/2020 1  % Final  . Abs Immature Granulocytes 05/09/2020 0.07  0.00 - 0.07 K/uL Final   Performed at [facility], [address] [place], [place] [address]  Admission on 05/04/2020, Discharged on 05/08/2020  Component Date Value Ref Range Status  . Glucose-Capillary 05/04/2020 95  70 - 99 mg/dL Final   Glucose reference range applies only to samples taken after fasting for at least 8 hours.  . Glucose-Capillary 05/04/2020 106* 70 - 99 mg/dL Final   Glucose reference range applies only to samples taken after fasting for at least 8 hours.  . Glucose-Capillary 05/05/2020 78  70 - 99 mg/dL Final   Glucose reference range applies only to samples taken after fasting for at least 8 hours.  . Comment 1 05/05/2020 Notify RN   Final  . Comment 2 05/05/2020 Document in Chart   Final  . Glucose-Capillary 05/05/2020 84  70 - 99 mg/dL Final   Glucose reference range applies only to samples taken after fasting for at least 8 hours.  . Glucose-Capillary 05/05/2020 89  70 - 99 mg/dL Final   Glucose reference range applies only to samples taken after fasting for at least 8 hours.  . Comment 1 05/05/2020 Notify RN   Final  . Comment 2 05/05/2020 Document in Chart   Final  . Glucose-Capillary 05/06/2020 90  70 -  99 mg/dL  Final   Glucose reference range applies only to samples taken after fasting for at least 8 hours.  . Glucose-Capillary 05/06/2020 105* 70 - 99 mg/dL Final   Glucose reference range applies only to samples taken after fasting for at least 8 hours.  . Glucose-Capillary 05/06/2020 87  70 - 99 mg/dL Final   Glucose reference range applies only to samples taken after fasting for at least 8 hours.  . Glucose-Capillary 05/06/2020 108* 70 - 99 mg/dL Final   Glucose reference range applies only to samples taken after fasting for at least 8 hours.  . Glucose-Capillary 05/07/2020 89  70 - 99 mg/dL Final   Glucose reference range applies only to samples taken after fasting for at least 8 hours.  Admission on 05/03/2020, Discharged on 05/04/2020  Component Date Value Ref Range Status  . SARS Coronavirus 2 05/03/2020 NEGATIVE  NEGATIVE Final   Comment: (NOTE) SARS-CoV-2 target nucleic acids are NOT DETECTED.  The SARS-CoV-2 RNA is generally detectable in upper and lower respiratory specimens during the acute phase of infection. The lowest concentration of SARS-CoV-2 viral copies this assay can detect is 250 copies / mL. A negative result does not preclude SARS-CoV-2 infection and should not be used as the sole basis for treatment or other patient management decisions.  A negative result may occur with improper specimen collection / handling, submission of specimen other than nasopharyngeal swab, presence of viral mutation(s) within the areas targeted by this assay, and inadequate number of viral copies (<250 copies / mL). A negative result must be combined with clinical observations, patient history, and epidemiological information.  Fact Sheet for Patients:   BoilerBrush.com.cy  Fact Sheet for Healthcare Providers: https://pope.com/  This test is not yet approved or                           cleared by the [place] FDA and has been  authorized for detection and/or diagnosis of SARS-CoV-2 by FDA under an Emergency Use Authorization (EUA).  This EUA will remain in effect (meaning this test can be used) for the duration of the COVID-19 declaration under Section 564(b)(1) of the Act, 21 U.S.C. section 360bbb-3(b)(1), unless the authorization is terminated or revoked sooner.  Performed at [facility], [address], [place], [place] [address]   . WBC 05/03/2020 12.6* 4.0 - 10.5 K/uL Final  . RBC 05/03/2020 4.29  4.22 - 5.81 MIL/uL Final  . Hemoglobin 05/03/2020 12.4* 13.0 - 17.0 g/dL Final  . HCT 23/55/7322 39.7  39 - 52 % Final  . MCV 05/03/2020 92.5  80.0 - 100.0 fL Final  . MCH 05/03/2020 28.9  26.0 - 34.0 pg Final  . MCHC 05/03/2020 31.2  30.0 - 36.0 g/dL Final  . RDW 02/54/2706 13.5  11.5 - 15.5 % Final  . Platelets 05/03/2020 613* 150 - 400 K/uL Final  . nRBC 05/03/2020 0.0  0.0 - 0.2 % Final  . Neutrophils Relative % 05/03/2020 71  % Final  . Neutro Abs 05/03/2020 9.1* 1.7 - 7.7 K/uL Final  . Lymphocytes Relative 05/03/2020 15  % Final  . Lymphs Abs 05/03/2020 1.8  0.7 - 4.0 K/uL Final  . Monocytes Relative 05/03/2020 9  % Final  . Monocytes Absolute 05/03/2020 1.1* 0.1 - 1.0 K/uL Final  . Eosinophils Relative 05/03/2020 3  % Final  . Eosinophils Absolute 05/03/2020 0.4  0.0 - 0.5 K/uL Final  . Basophils Relative 05/03/2020 1  %  Final  . Basophils Absolute 05/03/2020 0.1  0.0 - 0.1 K/uL Final  . Immature Granulocytes 05/03/2020 1  % Final  . Abs Immature Granulocytes 05/03/2020 0.06  0.00 - 0.07 K/uL Final   Performed at [facility], [address] [place], [place] [address]  . Hgb A1c MFr Bld 05/03/2020 12.0* 4.8 - 5.6 % Final   Comment: (NOTE)         Prediabetes: 5.7 - 6.4         Diabetes: >6.4         Glycemic control for adults with diabetes: <7.0   . Mean Plasma Glucose 05/03/2020 298  mg/dL Final   Comment: (NOTE) Performed At: [facility] [address] [place],  [place] [id number] Jolene Schimke MD OZ:[id number]   . Alcohol, Ethyl (B) 05/03/2020 <10  <10 mg/dL Final   Comment: (NOTE) Lowest detectable limit for serum alcohol is 10 mg/dL.  For medical purposes only. Performed at [facility], [address] [place], [place] [address]   . TSH 05/03/2020 0.419  0.350 - 4.500 uIU/mL Final   Comment: Performed by a 3rd Generation assay with a functional sensitivity of <=0.01 uIU/mL. Performed at [facility], [address] [place], [place] [address]   . Sodium 05/03/2020 138  135 - 145 mmol/L Final  . Potassium 05/03/2020 4.0  3.5 - 5.1 mmol/L Final  . Chloride 05/03/2020 101  98 - 111 mmol/L Final  . CO2 05/03/2020 27  22 - 32 mmol/L Final  . Glucose, Bld 05/03/2020 87  70 - 99 mg/dL Final   Glucose reference range applies only to samples taken after fasting for at least 8 hours.  . BUN 05/03/2020 9  6 - 20 mg/dL Final  . Creatinine, Ser 05/03/2020 0.94  0.61 - 1.24 mg/dL Final  . Calcium 44/08/270 8.9  8.9 - 10.3 mg/dL Final  . Total Protein 05/03/2020 7.0  6.5 - 8.1 g/dL Final  . Albumin 53/66/4403 2.9* 3.5 - 5.0 g/dL Final  . AST 47/42/5956 21  15 - 41 U/L Final  . ALT 05/03/2020 21  0 - 44 U/L Final  . Alkaline Phosphatase 05/03/2020 91  38 - 126 U/L Final  . Total Bilirubin 05/03/2020 1.0  0.3 - 1.2 mg/dL Final  . GFR calc non Af Amer 05/03/2020 >60  >60 mL/min Final  . GFR calc Af Amer 05/03/2020 >60  >60 mL/min Final  . Anion gap 05/03/2020 10  5 - 15 Final   Performed at [facility], [address], [place], [place] [address]  . POC Amphetamine UR 05/03/2020 None Detected  None Detected Final  . POC Secobarbital (BAR) 05/03/2020 None Detected  None Detected Final  . POC Buprenorphine (BUP) 05/03/2020 None Detected  None Detected Final  . POC Oxazepam (BZO) 05/03/2020 None Detected  None Detected Final  . POC Cocaine UR 05/03/2020 Positive* None Detected Final  . POC Methamphetamine UR 05/03/2020 None Detected   None Detected Final  . POC Morphine 05/03/2020 None Detected  None Detected Final  . POC Oxycodone UR 05/03/2020 Positive* None Detected Final  . POC Methadone UR 05/03/2020 None Detected  None Detected Final  . POC Marijuana UR 05/03/2020 Positive* None Detected Final  . SARS Coronavirus 2 Ag 05/03/2020 Negative  Negative Final  Admission on 04/22/2020, Discharged on 04/30/2020  Component Date Value Ref Range Status  . Sodium 04/22/2020 138  135 - 145 mmol/L Final  . Potassium 04/22/2020  4.1  3.5 - 5.1 mmol/L Final  . Chloride 04/22/2020 104  98 - 111 mmol/L Final  . CO2 04/22/2020 25  22 - 32 mmol/L Final  . Glucose, Bld 04/22/2020 98  70 - 99 mg/dL Final   Glucose reference range applies only to samples taken after fasting for at least 8 hours.  . BUN 04/22/2020 11  6 - 20 mg/dL Final  . Creatinine, Ser 04/22/2020 0.66  0.61 - 1.24 mg/dL Final  . Calcium 81/19/1478 8.7* 8.9 - 10.3 mg/dL Final  . GFR calc non Af Amer 04/22/2020 >60  >60 mL/min Final  . GFR calc Af Amer 04/22/2020 >60  >60 mL/min Final  . Anion gap 04/22/2020 9  5 - 15 Final   Performed at [facility], [address], [place], [place] [address]  . WBC 04/22/2020 15.5* 4.0 - 10.5 K/uL Final  . RBC 04/22/2020 3.86* 4.22 - 5.81 MIL/uL Final  . Hemoglobin 04/22/2020 11.8* 13.0 - 17.0 g/dL Final  . HCT 13/03/6577 37.2* 39 - 52 % Final  . MCV 04/22/2020 96.4  80.0 - 100.0 fL Final  . MCH 04/22/2020 30.6  26.0 - 34.0 pg Final  . MCHC 04/22/2020 31.7  30.0 - 36.0 g/dL Final  . RDW 46/96/2952 14.2  11.5 - 15.5 % Final  . Platelets 04/22/2020 646* 150 - 400 K/uL Final  . nRBC 04/22/2020 0.0  0.0 - 0.2 % Final   Performed at [facility], [address], [place], [place] [address]  . Troponin I (High Sensitivity) 04/22/2020 <2  <18 ng/L Final   Comment: (NOTE) Elevated high sensitivity troponin I (hsTnI) values and significant  changes across serial measurements may suggest ACS but many  other  chronic and acute conditions are known to elevate hsTnI results.  Refer to the "Links" section for chest pain algorithms and additional  guidance. Performed at [facility], [address] [place], [place] [address]   . WBC 04/23/2020 12.5* 4.0 - 10.5 K/uL Final  . RBC 04/23/2020 3.87* 4.22 - 5.81 MIL/uL Final  . Hemoglobin 04/23/2020 11.6* 13.0 - 17.0 g/dL Final  . HCT 27/25/3664 36.4* 39 - 52 % Final  . MCV 04/23/2020 94.1  80.0 - 100.0 fL Final  . MCH 04/23/2020 30.0  26.0 - 34.0 pg Final  . MCHC 04/23/2020 31.9  30.0 - 36.0 g/dL Final  . RDW 40/34/7425 14.1  11.5 - 15.5 % Final  . Platelets 04/23/2020 712* 150 - 400 K/uL Final  . nRBC 04/23/2020 0.0  0.0 - 0.2 % Final   Performed at [facility], [address] [place], [place] [address]  . Sodium 04/23/2020 136  135 - 145 mmol/L Final  . Potassium 04/23/2020 3.8  3.5 - 5.1 mmol/L Final  . Chloride 04/23/2020 102  98 - 111 mmol/L Final  . CO2 04/23/2020 26  22 - 32 mmol/L Final  . Glucose, Bld 04/23/2020 95  70 - 99 mg/dL Final   Glucose reference range applies only to samples taken after fasting for at least 8 hours.  . BUN 04/23/2020 5* 6 - 20 mg/dL Final  . Creatinine, Ser 04/23/2020 0.71  0.61 - 1.24 mg/dL Final  . Calcium 75/64/3329 8.5* 8.9 - 10.3 mg/dL Final  . GFR calc non Af Amer 04/23/2020 >60  >60 mL/min Final  . GFR calc Af Amer 04/23/2020 >60  >60 mL/min Final  . Anion gap 04/23/2020 8  5 - 15 Final  Performed at [facility], [address] [place], [place] [address]  . SARS Coronavirus 2 04/23/2020 NEGATIVE  NEGATIVE Final   Comment: (NOTE) SARS-CoV-2 target nucleic acids are NOT DETECTED.  The SARS-CoV-2 RNA is generally detectable in upper and lower respiratory specimens during the acute phase of infection. The lowest concentration of SARS-CoV-2 viral copies this assay can detect is 250 copies / mL. A negative result does not preclude SARS-CoV-2 infection and should  not be used as the sole basis for treatment or other patient management decisions.  A negative result may occur with improper specimen collection / handling, submission of specimen other than nasopharyngeal swab, presence of viral mutation(s) within the areas targeted by this assay, and inadequate number of viral copies (<250 copies / mL). A negative result must be combined with clinical observations, patient history, and epidemiological information.  Fact Sheet for Patients:   BoilerBrush.com.cy  Fact Sheet for Healthcare Providers: https://pope.com/  This test is not yet approved or                           cleared by the [place] FDA and has been authorized for detection and/or diagnosis of SARS-CoV-2 by FDA under an Emergency Use Authorization (EUA).  This EUA will remain in effect (meaning this test can be used) for the duration of the COVID-19 declaration under Section 564(b)(1) of the Act, 21 U.S.C. section 360bbb-3(b)(1), unless the authorization is terminated or revoked sooner.  Performed at [facility], [address], [place], [place] [address]   . Prothrombin Time 04/26/2020 14.5  11.4 - 15.2 seconds Final  . INR 04/26/2020 1.2  0.8 - 1.2 Final   Comment: (NOTE) INR goal varies based on device and disease states. Performed at [facility], [address] [place], [place] [address]   . aPTT 04/26/2020 34  24 - 36 seconds Final   Performed at [facility], [address] [place], [place] [address]  . WBC 04/27/2020 14.2* 4.0 - 10.5 K/uL Final  . RBC 04/27/2020 4.47  4.22 - 5.81 MIL/uL Final  . Hemoglobin 04/27/2020 13.1  13.0 - 17.0 g/dL Final  . HCT 65/78/4696 40.5  39 - 52 % Final  . MCV 04/27/2020 90.6  80.0 - 100.0 fL Final  . MCH 04/27/2020 29.3  26.0 - 34.0 pg Final  . MCHC 04/27/2020 32.3  30.0 - 36.0 g/dL Final  . RDW 29/52/8413 13.3  11.5 - 15.5 % Final  . Platelets 04/27/2020 767*  150 - 400 K/uL Final  . nRBC 04/27/2020 0.0  0.0 - 0.2 % Final   Performed at [facility], [address] [place], [place] [address]  . Sodium 04/27/2020 137  135 - 145 mmol/L Final  . Potassium 04/27/2020 3.9  3.5 - 5.1 mmol/L Final  . Chloride 04/27/2020 102  98 - 111 mmol/L Final  . CO2 04/27/2020 25  22 - 32 mmol/L Final  . Glucose, Bld 04/27/2020 89  70 - 99 mg/dL Final   Glucose reference range applies only to samples taken after fasting for at least 8 hours.  . BUN 04/27/2020 11  6 - 20 mg/dL Final  . Creatinine, Ser 04/27/2020 0.87  0.61 - 1.24 mg/dL Final  . Calcium 02/72/5366 8.8* 8.9 - 10.3 mg/dL Final  . GFR calc non Af Amer 04/27/2020 >60  >60 mL/min Final  . GFR calc Af Amer 04/27/2020 >60  >  60 mL/min Final  . Anion gap 04/27/2020 10  5 - 15 Final   Performed at [facility], [address] [place], [place] [address]  . Specimen Description 04/26/2020 FLUID RIGHT PLEURAL   Final  . Special Requests 04/26/2020 Normal   Final  . Gram Stain 04/26/2020    Final                   Value:RARE WBC PRESENT, PREDOMINANTLY PMN NO ORGANISMS SEEN   . Culture 04/26/2020    Final                   Value:No growth aerobically or anaerobically. Performed at [facility], [address] [place], [place] [address]   . Report Status 04/26/2020 05/01/2020 FINAL   Final  . WBC 04/28/2020 13.9* 4.0 - 10.5 K/uL Final  . RBC 04/28/2020 4.21* 4.22 - 5.81 MIL/uL Final  . Hemoglobin 04/28/2020 12.2* 13.0 - 17.0 g/dL Final  . HCT 65/78/4696 37.9* 39 - 52 % Final  . MCV 04/28/2020 90.0  80.0 - 100.0 fL Final  . MCH 04/28/2020 29.0  26.0 - 34.0 pg Final  . MCHC 04/28/2020 32.2  30.0 - 36.0 g/dL Final  . RDW 29/52/8413 13.2  11.5 - 15.5 % Final  . Platelets 04/28/2020 750* 150 - 400 K/uL Final  . nRBC 04/28/2020 0.0  0.0 - 0.2 % Final   Performed at [facility], [address] [place], [place] [address]  . WBC 04/29/2020 12.9* 4.0 - 10.5 K/uL Final  . RBC 04/29/2020  4.06* 4.22 - 5.81 MIL/uL Final  . Hemoglobin 04/29/2020 11.8* 13.0 - 17.0 g/dL Final  . HCT 02/72/5366 36.6* 39 - 52 % Final  . MCV 04/29/2020 90.1  80.0 - 100.0 fL Final  . MCH 04/29/2020 29.1  26.0 - 34.0 pg Final  . MCHC 04/29/2020 32.2  30.0 - 36.0 g/dL Final  . RDW 44/10/4740 13.2  11.5 - 15.5 % Final  . Platelets 04/29/2020 695* 150 - 400 K/uL Final  . nRBC 04/29/2020 0.0  0.0 - 0.2 % Final   Performed at [facility], [address] [place], [place] [address]  . WBC 04/30/2020 12.3* 4.0 - 10.5 K/uL Final  . RBC 04/30/2020 4.35  4.22 - 5.81 MIL/uL Final  . Hemoglobin 04/30/2020 12.4* 13.0 - 17.0 g/dL Final  . HCT 87/56/4332 39.1  39 - 52 % Final  . MCV 04/30/2020 89.9  80.0 - 100.0 fL Final  . MCH 04/30/2020 28.5  26.0 - 34.0 pg Final  . MCHC 04/30/2020 31.7  30.0 - 36.0 g/dL Final  . RDW 95/18/8416 13.2  11.5 - 15.5 % Final  . Platelets 04/30/2020 717* 150 - 400 K/uL Final  . nRBC 04/30/2020 0.0  0.0 - 0.2 % Final   Performed at [facility], [address] [place], [place] [address]  . Sodium 04/30/2020 139  135 - 145 mmol/L Final  . Potassium 04/30/2020 4.2  3.5 - 5.1 mmol/L Final  . Chloride 04/30/2020 100  98 - 111 mmol/L Final  . CO2 04/30/2020 28  22 - 32 mmol/L Final  . Glucose, Bld 04/30/2020 95  70 - 99 mg/dL Final   Glucose reference range applies only to samples taken after fasting for at least 8 hours.  . BUN 04/30/2020 9  6 - 20 mg/dL Final  . Creatinine, Ser 04/30/2020 0.86  0.61 - 1.24 mg/dL Final  .  Calcium 04/30/2020 9.1  8.9 - 10.3 mg/dL Final  . GFR calc non Af Amer 04/30/2020 >60  >60 mL/min Final  . GFR calc Af Amer 04/30/2020 >60  >60 mL/min Final  . Anion gap 04/30/2020 11  5 - 15 Final   Performed at [facility], [address], [place], [place] [address]    Allergies: Tylenol [acetaminophen]  PTA Medications: (Not in a hospital admission)   Medical Decision Making  The patient will remain under observation overnight and  reassess in the A.M. to determine if he meets the criteria for psychiatric inpatient admission or can be discharged.   Recommendations  Based on my evaluation the patient does not appear to have an emergency medical condition.  Gillermo Murdoch, NP 05/24/20  4:17 AM

## 2020-05-24 NOTE — ED Notes (Signed)
Lunch given.

## 2020-05-24 NOTE — ED Notes (Signed)
Patient belongings are store in locker #26

## 2020-05-24 NOTE — ED Notes (Signed)
Pt agitated but cooperative this am. Continue to endorse SI. Pt states, "It seem like nothing is going right for me. I lost custody of my daughter and she is only 39 months old. They put her in foster care. Her mother is in jail and my SW don't want to help me. I don't know if it is voices or just my sub-conscious telling me to hurt people sometimes but I don't feel like hurting no one else today. Just myself and no, I don't have a plan this morning". Support given. Established rapport with pt. Informed pt to communicate honestly with NP this morning and be receptive to the help recommended. Pt verbalized agreement. Will continue to monitor.
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
## 2020-05-24 NOTE — ED Notes (Signed)
Safe transport called for transfer to [facility] 

## 2020-05-24 NOTE — ED Notes (Signed)
Pt presents with suicidal ideations, and auditory hallucinations, telling him to hurt other people, no plan noted.  Pt calm & cooperative, sad affect.  Skin search completed, monitoring for safety.

## 2020-05-24 NOTE — ED Notes (Signed)
Pt resting with eyes closed. No acute distress noted. Safety maintained. 

## 2020-05-24 NOTE — ED Notes (Signed)
Pt resting on pull out through evening, A&O, calm & cooperative, interacting appropriately with staff when awake. Denies current SI/HI/AVH. Needs met, safety maintained, NAD at this time.

## 2020-05-25 MED ORDER — QUETIAPINE FUMARATE ER 300 MG PO TB24
300.0000 mg | ORAL_TABLET | Freq: Every day | ORAL | Status: DC
  Filled 2020-05-25: qty 7

## 2020-05-25 MED ORDER — HYDROXYZINE HCL 25 MG PO TABS: 25.0000 mg | ORAL_TABLET | Freq: Three times a day (TID) | ORAL | 0 refills | Status: DC | PRN

## 2020-05-25 MED ORDER — OXCARBAZEPINE 150 MG PO TABS
150.0000 mg | ORAL_TABLET | Freq: Two times a day (BID) | ORAL | Status: DC
  Administered 2020-05-25: 150 mg via ORAL
  Filled 2020-05-25: qty 14
  Filled 2020-05-25: qty 1

## 2020-05-25 MED ORDER — OXCARBAZEPINE 150 MG PO TABS: 150.0000 mg | ORAL_TABLET | Freq: Two times a day (BID) | ORAL | 0 refills | Status: DC

## 2020-05-25 MED ORDER — DULOXETINE HCL 60 MG PO CPEP: 60.0000 mg | ORAL_CAPSULE | Freq: Every day | ORAL | 0 refills | Status: DC

## 2020-05-25 MED ORDER — GABAPENTIN 400 MG PO CAPS
400.0000 mg | ORAL_CAPSULE | Freq: Three times a day (TID) | ORAL | 0 refills | Status: DC
Start: 2020-05-25 — End: 2020-06-02

## 2020-05-25 MED ORDER — QUETIAPINE FUMARATE ER 300 MG PO TB24: 300.0000 mg | ORAL_TABLET | Freq: Every day | ORAL | 0 refills | Status: DC

## 2020-05-25 NOTE — ED Notes (Signed)
Pt educated about avs and meds. Verbalized understanding. Agitated about personal arrangements made with [facility] falling thru. Cussing and becoming more and more agitated. Escorted to retrieve belonging from locker #26. Ambulated per self. Escorted out back [place] for safety reason. No new issues noted. Stable at time of d/c

## 2020-05-25 NOTE — Discharge Instructions (Signed)

## 2020-05-25 NOTE — ED Notes (Signed)
Pt up eating breakfast. Pleasant. No new issues noted at this time. Will continue to monitor for safety
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
## 2020-05-26 ENCOUNTER — Ambulatory Visit: Payer: Self-pay | Admitting: *Deleted

## 2020-05-27 ENCOUNTER — Ambulatory Visit (INDEPENDENT_AMBULATORY_CARE_PROVIDER_SITE_OTHER): Payer: Self-pay | Admitting: Primary Care

## 2020-05-27 ENCOUNTER — Other Ambulatory Visit: Payer: Self-pay

## 2020-05-27 ENCOUNTER — Ambulatory Visit (HOSPITAL_COMMUNITY): Payer: No Payment, Other | Admitting: Psychiatry

## 2020-05-27 ENCOUNTER — Ambulatory Visit (HOSPITAL_COMMUNITY): Payer: No Payment, Other | Admitting: Licensed Clinical Social Worker

## 2020-05-27 ENCOUNTER — Encounter (INDEPENDENT_AMBULATORY_CARE_PROVIDER_SITE_OTHER): Payer: Self-pay | Admitting: Primary Care

## 2020-05-27 ENCOUNTER — Ambulatory Visit: Payer: Self-pay | Admitting: *Deleted

## 2020-05-27 VITALS — BP 131/81 | HR 96 | Ht 70.0 in | Wt 164.0 lb

## 2020-05-27 DIAGNOSIS — Z09 Encounter for follow-up examination after completed treatment for conditions other than malignant neoplasm: Secondary | ICD-10-CM

## 2020-05-27 DIAGNOSIS — Z7689 Persons encountering health services in other specified circumstances: Secondary | ICD-10-CM

## 2020-05-27 DIAGNOSIS — F3164 Bipolar disorder, current episode mixed, severe, with psychotic features: Secondary | ICD-10-CM

## 2020-05-27 DIAGNOSIS — Z760 Encounter for issue of repeat prescription: Secondary | ICD-10-CM

## 2020-05-27 DIAGNOSIS — F1910 Other psychoactive substance abuse, uncomplicated: Secondary | ICD-10-CM

## 2020-05-27 NOTE — Progress Notes (Signed)
New patient first time here.  Having pain in right arm and he states that he is out of pain medication.

## 2020-05-27 NOTE — Patient Instructions (Signed)
Information provided for you for [facility] behavioral health soon walk-in clinic on Fridays 1-5 and provided flyer about the Center .  Medications can be addressed and refill at that visit. Suicidal Feelings: How to Help Yourself Suicide is when you end your own life. There are many things you can do to help yourself feel better when struggling with these feelings. Many services and people are available to support you and others who struggle with similar feelings.  If you ever feel like you may hurt yourself or others, or have thoughts about taking your own life, get help right away. To get help:  Call your local emergency services (911 in the [place]  The [place] [address]'s health and human services helpline (211 in the [place]  Go to your nearest emergency department.  Call a suicide hotline to speak with a trained counselor. The following suicide hotlines are available in the [place]: ? 1-800-273-TALK (671) 497-8856). ? 1-800-SUICIDE (682) 084-4255). ? 410-589-9469. This is a hotline for Spanish speakers. ? 787-676-4855. This is a hotline for TTY users. ? 1-866-4-U-TREVOR 217-549-5157). This is a hotline for lesbian, gay, bisexual, transgender, or questioning youth. ? For a list of hotlines in [place], visit ItCheaper.dk.html  Contact a crisis center or a local suicide prevention center. To find a crisis center or suicide prevention center: ? Call your local hospital, clinic, community service organization, mental health center, social service provider, or health department. Ask for help with connecting to a crisis center. ? For a list of crisis centers in the [place], visit: suicidepreventionlifeline.org ? For a list of crisis centers in [place], visit: suicideprevention.ca How to help yourself feel better   Promise yourself that you will not do anything extreme when you have suicidal feelings. Remember, there is hope.  Many people have gotten through suicidal thoughts and feelings, and you can too. If you have had these feelings before, remind yourself that you can get through them again.  Let family, friends, teachers, or counselors know how you are feeling. Try not to separate yourself from those who care about you and want to help you. Talk with someone every day, even if you do not feel sociable. Face-to-face conversation is best to help them understand your feelings.  Contact a mental health care provider and work with this person regularly.  Make a safety plan that you can follow during a crisis. Include phone numbers of suicide prevention hotlines, mental health professionals, and trusted friends and family members you can call during an emergency. Save these numbers on your phone.  If you are thinking of taking a lot of medicine, give your medicine to someone who can give it to you as prescribed. If you are on antidepressants and are concerned you will overdose, tell your health care provider so that he or she can give you safer medicines.  Try to stick to your routines. Follow a schedule every day. Make self-care a priority.  Make a list of realistic goals, and cross them off when you achieve them. Accomplishments can give you a sense of worth.  Wait until you are feeling better before doing things that you find difficult or unpleasant.  Do things that you have always enjoyed to take your mind off your feelings. Try reading a book, or listening to or playing music. Spending time outside, in nature, may help you feel better. Follow these instructions at home:   Visit your primary health care provider every year for a checkup.  Work with a Print production planner  care provider as needed.  Eat a well-balanced diet, and eat regular meals.  Get plenty of rest.  Exercise if you are able. Just 30 minutes of exercise each day can help you feel better.  Take over-the-counter and prescription medicines only as  told by your health care provider. Ask your mental health care provider about the possible side effects of any medicines you are taking.  Do not use alcohol or drugs, and remove these substances from your home.  Remove weapons, poisons, knives, and other deadly items from your home. General recommendations  Keep your living space well lit.  When you are feeling well, write yourself a letter with tips and support that you can read when you are not feeling well.  Remember that life's difficulties can be sorted out with help. Conditions can be treated, and you can learn behaviors and ways of thinking that will help you. Where to find more information  National Suicide Prevention Lifeline: www.suicidepreventionlifeline.org  Hopeline: www.hopeline.com  [employer] for Suicide Prevention: https://www.ayers.com/  [employer] (for lesbian, gay, bisexual, transgender, or questioning youth): www.thetrevorproject.org Contact a health care provider if:  You feel as though you are a burden to others.  You feel agitated, angry, vengeful, or have extreme mood swings.  You have withdrawn from family and friends. Get help right away if:  You are talking about suicide or wishing to die.  You start making plans for how to commit suicide.  You feel that you have no reason to live.  You start making plans for putting your affairs in order, saying goodbye, or giving your possessions away.  You feel guilt, shame, or unbearable pain, and it seems like there is no way out.  You are frequently using drugs or alcohol.  You are engaging in risky behaviors that could lead to death. If you have any of these symptoms, get help right away. Call emergency services, go to your nearest emergency department or crisis center, or call a suicide crisis helpline. Summary  Suicide is when you take your own life.  Promise yourself that you will not do anything extreme when you have suicidal  feelings.  Let family, friends, teachers, or counselors know how you are feeling.  Get help right away if you feel as though life is getting too tough to handle and you are thinking about suicide. This information is not intended to replace advice given to you by your health care provider. Make sure you discuss any questions you have with your health care provider. Document Revised: 11/14/2018 Document Reviewed: 03/06/2017 Elsevier Patient Education  2020 [employer].
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
## 2020-06-01 ENCOUNTER — Telehealth: Payer: Self-pay | Admitting: General Practice

## 2020-06-01 NOTE — Telephone Encounter (Signed)
Copied from CRM [id number]. Topic: General - Other >> May 26, 2020  1:48 PM Daniel [name] wrote: Reason for CRM: Pt called and is requesting to speak with financial advisor regarding his orange card, disability, and mental health issues. Please advise.

## 2020-06-01 NOTE — Telephone Encounter (Signed)
I call back the PT, Pt is going to an outside PCP, he was inform that do to that he can not apply for the [facility] financial program with [place] at this time

## 2020-06-02 ENCOUNTER — Emergency Department (HOSPITAL_COMMUNITY)
Admission: EM | Admit: 2020-06-02 | Discharge: 2020-06-03 | Disposition: A | Discharge status: Home / Self Care | Payer: Self-pay | Attending: Emergency Medicine | Admitting: Emergency Medicine

## 2020-06-02 ENCOUNTER — Ambulatory Visit (HOSPITAL_COMMUNITY)
Admission: EM | Admit: 2020-06-02 | Discharge: 2020-06-02 | Disposition: A | Discharge status: Home / Self Care | Payer: No Payment, Other | Attending: Registered Nurse | Admitting: Registered Nurse

## 2020-06-02 ENCOUNTER — Ambulatory Visit: Payer: Self-pay | Admitting: Occupational Therapy

## 2020-06-02 ENCOUNTER — Encounter (HOSPITAL_COMMUNITY): Payer: Self-pay

## 2020-06-02 ENCOUNTER — Other Ambulatory Visit: Payer: Self-pay

## 2020-06-02 ENCOUNTER — Encounter (HOSPITAL_COMMUNITY): Payer: Self-pay | Admitting: Registered Nurse

## 2020-06-02 DIAGNOSIS — F17210 Nicotine dependence, cigarettes, uncomplicated: Secondary | ICD-10-CM | POA: Insufficient documentation

## 2020-06-02 DIAGNOSIS — F1414 Cocaine abuse with cocaine-induced mood disorder: Secondary | ICD-10-CM | POA: Insufficient documentation

## 2020-06-02 DIAGNOSIS — F4310 Post-traumatic stress disorder, unspecified: Secondary | ICD-10-CM | POA: Insufficient documentation

## 2020-06-02 DIAGNOSIS — M6281 Muscle weakness (generalized): Secondary | ICD-10-CM

## 2020-06-02 DIAGNOSIS — R45851 Suicidal ideations: Secondary | ICD-10-CM | POA: Insufficient documentation

## 2020-06-02 DIAGNOSIS — M25621 Stiffness of right elbow, not elsewhere classified: Secondary | ICD-10-CM

## 2020-06-02 DIAGNOSIS — M25511 Pain in right shoulder: Secondary | ICD-10-CM

## 2020-06-02 DIAGNOSIS — Z20822 Contact with and (suspected) exposure to covid-19: Secondary | ICD-10-CM | POA: Insufficient documentation

## 2020-06-02 DIAGNOSIS — F1220 Cannabis dependence, uncomplicated: Secondary | ICD-10-CM | POA: Insufficient documentation

## 2020-06-02 DIAGNOSIS — F322 Major depressive disorder, single episode, severe without psychotic features: Secondary | ICD-10-CM | POA: Insufficient documentation

## 2020-06-02 DIAGNOSIS — M25611 Stiffness of right shoulder, not elsewhere classified: Secondary | ICD-10-CM

## 2020-06-02 DIAGNOSIS — F329 Major depressive disorder, single episode, unspecified: Secondary | ICD-10-CM | POA: Insufficient documentation

## 2020-06-02 LAB — COMPREHENSIVE METABOLIC PANEL
ALT: 22 U/L (ref 0–44)
AST: 24 U/L (ref 15–41)
Albumin: 3.8 g/dL (ref 3.5–5.0)
Alkaline Phosphatase: 87 U/L (ref 38–126)
Anion gap: 11 (ref 5–15)
BUN: 8 mg/dL (ref 6–20)
CO2: 27 mmol/L (ref 22–32)
Calcium: 9.5 mg/dL (ref 8.9–10.3)
Chloride: 101 mmol/L (ref 98–111)
Creatinine, Ser: 0.97 mg/dL (ref 0.61–1.24)
GFR, Estimated: >60 mL/min (ref >60)
Glucose, Bld: 114 mg/dL — ABNORMAL HIGH (ref 70–99)
Potassium: 3.4 mmol/L — ABNORMAL LOW (ref 3.5–5.1)
Sodium: 139 mmol/L (ref 135–145)
Total Bilirubin: 0.5 mg/dL (ref 0.3–1.2)
Total Protein: 7 g/dL (ref 6.5–8.1)

## 2020-06-02 LAB — CBC
HCT: 45.4 % (ref 39.0–52.0)
Hemoglobin: 14.6 g/dL (ref 13.0–17.0)
MCH: 29.3 pg (ref 26.0–34.0)
MCHC: 32.2 g/dL (ref 30.0–36.0)
MCV: 91.2 fL (ref 80.0–100.0)
Platelets: 249 10*3/uL (ref 150–400)
RBC: 4.98 MIL/uL (ref 4.22–5.81)
RDW: 14.5 % (ref 11.5–15.5)
WBC: 10.2 10*3/uL (ref 4.0–10.5)
nRBC: 0 % (ref 0.0–0.2)

## 2020-06-02 LAB — ETHANOL: Alcohol, Ethyl (B): <10 mg/dL (ref <10)

## 2020-06-02 LAB — SALICYLATE LEVEL: Salicylate Lvl: <7 mg/dL — ABNORMAL LOW (ref 7.0–30.0)

## 2020-06-02 LAB — RAPID URINE DRUG SCREEN, HOSP PERFORMED
Amphetamines: NOT DETECTED
Barbiturates: NOT DETECTED
Benzodiazepines: NOT DETECTED
Cocaine: POSITIVE — AB
Opiates: NOT DETECTED
Tetrahydrocannabinol: POSITIVE — AB

## 2020-06-02 LAB — ACETAMINOPHEN LEVEL: Acetaminophen (Tylenol), Serum: <10 ug/mL — ABNORMAL LOW (ref 10–30)

## 2020-06-02 MED ORDER — DULOXETINE HCL 60 MG PO CPEP
60.0000 mg | ORAL_CAPSULE | Freq: Every day | ORAL | 0 refills | Status: DC
Start: 2020-06-02 — End: 2020-06-03

## 2020-06-02 MED ORDER — QUETIAPINE FUMARATE ER 300 MG PO TB24: 300.0000 mg | ORAL_TABLET | Freq: Every day | ORAL | 0 refills | Status: DC

## 2020-06-02 MED ORDER — OXCARBAZEPINE 150 MG PO TABS: 150.0000 mg | ORAL_TABLET | Freq: Two times a day (BID) | ORAL | 0 refills | Status: DC

## 2020-06-02 MED ORDER — GABAPENTIN 400 MG PO CAPS
400.0000 mg | ORAL_CAPSULE | Freq: Three times a day (TID) | ORAL | 0 refills | Status: DC
Start: 2020-06-02 — End: 2020-06-03

## 2020-06-02 NOTE — Patient Instructions (Signed)
Cranial Flexion: Overhead Arm Extension - Supine (Med[facility] [place] [facility])    Lie with knees bent, arms beyond head, holding _paper towel roll. Pull ball up to above face. Repeat _10___ times per set. Do _1___ sets per session. Do _2_ sessions per day.  Flexion (Assistive)    Sitting up, clasp hands together and raise arms above head (not as far as picture), keeping elbows as straight as possible.  Repeat _10___ times. Do __3__ sessions per day.    Flexion (Passive)    Sitting upright, slide forearms forward along table (folded towel underneath), bending from the waist until a stretch is felt. Hold __3__ seconds. Repeat _10___ times. Then do big circles clockwise x 5 reps. Do _3__ sessions per day.  Extension (Passive)    Place folded pillow on table and rest upper arm on it. Actively straighten elbow first, then gently hold forearm with other hand and use a steady downward and outward pull to straighten elbow. Hold _5___ seconds. Repeat _10___ times. Do __3__ sessions per day.   Extension (Resistive)    With wrist over edge of table, lift _16___ ounces (1 lb), keeping arm on table surface. Hold __3__ seconds. Lower slowly. Repeat _10___ times. Do _2___ sessions per day.

## 2020-06-02 NOTE — Discharge Instructions (Signed)
You are encouraged to follow up with [facility] for medication management.    Walk in hours are M-TH from 8-11 and on Friday from 8-5.     [facility] [place] - [phone]
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
## 2020-06-02 NOTE — ED Triage Notes (Signed)
Pt states that he has been having suicidal thoughts recently, thoughts of walking into traffic today, pt states lots of recent stressors, denies HI/AVH
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
## 2020-06-02 NOTE — Progress Notes (Signed)
Daniel [name] was presented with his AVS, questions answered and he retrieved his personal belongings. He was escorted to the lobby.

## 2020-06-03 ENCOUNTER — Encounter (HOSPITAL_COMMUNITY): Payer: Self-pay

## 2020-06-03 ENCOUNTER — Ambulatory Visit (HOSPITAL_COMMUNITY)
Admission: EM | Admit: 2020-06-03 | Discharge: 2020-06-03 | Disposition: A | Discharge status: Home / Self Care | Payer: No Payment, Other | Attending: Nurse Practitioner | Admitting: Nurse Practitioner

## 2020-06-03 ENCOUNTER — Other Ambulatory Visit: Payer: Self-pay

## 2020-06-03 DIAGNOSIS — R45851 Suicidal ideations: Secondary | ICD-10-CM | POA: Insufficient documentation

## 2020-06-03 DIAGNOSIS — F332 Major depressive disorder, recurrent severe without psychotic features: Secondary | ICD-10-CM | POA: Insufficient documentation

## 2020-06-03 DIAGNOSIS — F1414 Cocaine abuse with cocaine-induced mood disorder: Secondary | ICD-10-CM

## 2020-06-03 LAB — RESPIRATORY PANEL BY RT PCR (FLU A&B, COVID)
Influenza A by PCR: NEGATIVE
Influenza B by PCR: NEGATIVE
SARS Coronavirus 2 by RT PCR: NEGATIVE

## 2020-06-03 MED ORDER — DULOXETINE HCL 60 MG PO CPEP
60.0000 mg | ORAL_CAPSULE | Freq: Every day | ORAL | Status: DC
  Administered 2020-06-03: 60 mg via ORAL
  Filled 2020-06-03: qty 1

## 2020-06-03 MED ORDER — DULOXETINE HCL 60 MG PO CPEP: 60.0000 mg | ORAL_CAPSULE | Freq: Every day | ORAL | 0 refills | Status: DC

## 2020-06-03 MED ORDER — QUETIAPINE FUMARATE ER 300 MG PO TB24: 300.0000 mg | ORAL_TABLET | Freq: Every day | ORAL | Status: DC

## 2020-06-03 MED ORDER — QUETIAPINE FUMARATE ER 50 MG PO TB24
100.0000 mg | ORAL_TABLET | Freq: Once | ORAL | Status: AC
  Administered 2020-06-03: 100 mg via ORAL
  Filled 2020-06-03: qty 2

## 2020-06-03 MED ORDER — GABAPENTIN 400 MG PO CAPS
400.0000 mg | ORAL_CAPSULE | Freq: Three times a day (TID) | ORAL | Status: DC
  Administered 2020-06-03: 400 mg via ORAL
  Filled 2020-06-03: qty 1

## 2020-06-03 MED ORDER — GABAPENTIN 400 MG PO CAPS: 400.0000 mg | ORAL_CAPSULE | Freq: Three times a day (TID) | ORAL | Status: DC

## 2020-06-03 MED ORDER — IBUPROFEN 400 MG PO TABS: 600.0000 mg | ORAL_TABLET | Freq: Three times a day (TID) | ORAL | Status: DC | PRN

## 2020-06-03 MED ORDER — LIDOCAINE 5 % EX PTCH
1.0000 | MEDICATED_PATCH | CUTANEOUS | Status: DC
  Filled 2020-06-03: qty 1

## 2020-06-03 MED ORDER — GABAPENTIN 400 MG PO CAPS
400.0000 mg | ORAL_CAPSULE | Freq: Three times a day (TID) | ORAL | 0 refills | Status: DC
Start: 2020-06-03 — End: 2022-10-23

## 2020-06-03 MED ORDER — OXCARBAZEPINE 150 MG PO TABS
150.0000 mg | ORAL_TABLET | Freq: Two times a day (BID) | ORAL | Status: DC
  Administered 2020-06-03: 150 mg via ORAL
  Filled 2020-06-03: qty 1

## 2020-06-03 MED ORDER — OXCARBAZEPINE 150 MG PO TABS
150.0000 mg | ORAL_TABLET | Freq: Two times a day (BID) | ORAL | 0 refills | Status: DC
Start: 2020-06-03 — End: 2022-10-23

## 2020-06-03 MED ORDER — ALUM & MAG HYDROXIDE-SIMETH 200-200-20 MG/5ML PO SUSP: 30.0000 mL | ORAL | Status: DC | PRN

## 2020-06-03 MED ORDER — MAGNESIUM HYDROXIDE 400 MG/5ML PO SUSP: 30.0000 mL | Freq: Every day | ORAL | Status: DC | PRN

## 2020-06-03 MED ORDER — QUETIAPINE FUMARATE ER 300 MG PO TB24
300.0000 mg | ORAL_TABLET | Freq: Every day | ORAL | 0 refills | Status: DC
Start: 2020-06-03 — End: 2022-10-23

## 2020-06-03 MED ORDER — OXCARBAZEPINE 150 MG PO TABS: 150.0000 mg | ORAL_TABLET | Freq: Two times a day (BID) | ORAL | Status: DC
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
## 2020-06-03 NOTE — ED Notes (Signed)
Patient denies SI/Hi and A/V/H. Patient wanting inpatient treatment for substance abuse.
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
## 2020-06-03 NOTE — ED Notes (Signed)
Nurse Discharge Note:  D:Patient denies SI/HI/AVH at this time. Pt appears calm and cooperative, and no distress noted.  A: All Personal items in locker returned to pt. Pt given AVS/ Follow-Up/Prescriptions. Pt escorted to the lobby to meet safe transport.  R:  Pt States he will comply with outpatient services, and take medications as prescribed.

## 2020-06-03 NOTE — ED Provider Notes (Signed)
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 samples taken after fasting for at least 8 hours.  . Glucose-Capillary 05/20/2020 75  70 - 99 mg/dL Final   Glucose reference range applies only to samples taken after fasting for at least 8 hours.   Admission on 05/17/2020, Discharged on 05/18/2020  Component Date Value Ref Range Status  . Sodium 05/17/2020 137  135 - 145 mmol/L Final  . Potassium 05/17/2020 3.7  3.5 - 5.1 mmol/L Final  . Chloride 05/17/2020 101  98 - 111 mmol/L Final  . CO2 05/17/2020 25  22 - 32 mmol/L Final  . Glucose, Bld 05/17/2020 137* 70 - 99 mg/dL Final   Glucose reference range applies only to samples taken after fasting for at least 8 hours.  . BUN 05/17/2020 8  6 - 20 mg/dL Final  . Creatinine, Ser 05/17/2020 0.92  0.61 - 1.24 mg/dL Final  . Calcium 60/45/4098 8.9  8.9 - 10.3 mg/dL Final  . Total Protein 05/17/2020 7.0  6.5 - 8.1 g/dL Final  . Albumin 11/91/4782 3.4* 3.5 - 5.0 g/dL Final  . AST 95/62/1308 19  15 - 41 U/L Final  . ALT 05/17/2020 14  0 - 44 U/L Final  . Alkaline Phosphatase 05/17/2020 88  38 - 126 U/L Final  . Total Bilirubin 05/17/2020 0.9  0.3 - 1.2 mg/dL Final  . GFR, Estimated 05/17/2020 >60  >60 mL/min Final  . Anion gap 05/17/2020 11  5 - 15 Final   Performed at [facility], [address], [place], [place] [address]  . Alcohol, Ethyl (B) 05/17/2020 <10  <10 mg/dL Final   Comment: (NOTE) Lowest detectable limit for serum alcohol is 10 mg/dL.  For medical purposes only. Performed at [facility], [address], [place], [place] [address]   . Salicylate Lvl 05/17/2020 <7.0* 7.0 - 30.0 mg/dL Final   Performed at [facility], [address] [place], [place] [address]  . Acetaminophen (Tylenol), Serum 05/17/2020 <10* 10 - 30 ug/mL Final   Comment: (NOTE) Therapeutic concentrations vary significantly. A range of 10-30 ug/mL  may be an effective concentration for many patients. However, some  are best treated at concentrations outside of this range. Acetaminophen concentrations >150 ug/mL at 4 hours after ingestion  and >50 ug/mL at 12 hours after ingestion are often associated with  toxic reactions.  Performed at [facility], [address]  [place], [place] [address]   . WBC 05/17/2020 9.9  4.0 - 10.5 K/uL Final  . RBC 05/17/2020 4.68  4.22 - 5.81 MIL/uL Final  . Hemoglobin 05/17/2020 13.6  13.0 - 17.0 g/dL Final  . HCT 56/43/3295 42.8  39 - 52 % Final  . MCV 05/17/2020 91.5  80.0 - 100.0 fL Final  . MCH 05/17/2020 29.1  26.0 - 34.0 pg Final  . MCHC 05/17/2020 31.8  30.0 - 36.0 g/dL Final  . RDW 18/84/1660 14.5  11.5 - 15.5 % Final  . Platelets 05/17/2020 268  150 - 400 K/uL Final  . nRBC 05/17/2020 0.0  0.0 - 0.2 % Final   Performed at [facility], [address] [place], [place] [address]  . Opiates 05/17/2020 NONE DETECTED  NONE DETECTED Final  . Cocaine 05/17/2020 POSITIVE* NONE DETECTED Final  . Benzodiazepines 05/17/2020 NONE DETECTED  NONE DETECTED Final  . Amphetamines 05/17/2020 NONE DETECTED  NONE DETECTED Final  . Tetrahydrocannabinol 05/17/2020 POSITIVE* NONE DETECTED Final  . Barbiturates 05/17/2020 NONE DETECTED  NONE DETECTED Final   Comment: (NOTE) DRUG SCREEN FOR MEDICAL PURPOSES ONLY.  IF CONFIRMATION IS NEEDED FOR ANY PURPOSE, NOTIFY LAB WITHIN 5 DAYS.  LOWEST DETECTABLE LIMITS FOR URINE DRUG SCREEN Drug Class                     Cutoff (ng/mL) Amphetamine and metabolites    1000 Barbiturate and metabolites    200 Benzodiazepine                 200 Tricyclics and metabolites     300 Opiates and metabolites        300 Cocaine and metabolites        300 THC                            50 Performed at [facility], [address] [place], [place] [address]   . SARS Coronavirus 2 by RT PCR 05/17/2020 NEGATIVE  NEGATIVE Final   Comment: (NOTE) SARS-CoV-2 target nucleic acids are NOT DETECTED.  The SARS-CoV-2 RNA is generally detectable in upper respiratoy specimens during the acute phase of infection. The lowest concentration of SARS-CoV-2 viral copies this assay can detect is 131 copies/mL. A negative result does not preclude SARS-Cov-2 infection and should not be used as the sole  basis for treatment or other patient management decisions. A negative result may  occur with  improper specimen collection/handling, submission of specimen other than nasopharyngeal swab, presence of viral mutation(s) within the areas targeted by this assay, and inadequate number of viral copies (<131 copies/mL). A negative result must be combined with clinical observations, patient history, and epidemiological information. The expected result is Negative.  Fact Sheet for Patients:  https://www.moore.com/  Fact Sheet for Healthcare Providers:  https://www.young.biz/  This test is no                          t yet approved or cleared by the [place] FDA and  has been authorized for detection and/or diagnosis of SARS-CoV-2 by FDA under an Emergency Use Authorization (EUA). This EUA will remain  in effect (meaning this test can be used) for the duration of the COVID-19 declaration under Section 564(b)(1) of the Act, 21 U.S.C. section 360bbb-3(b)(1), unless the authorization is terminated or revoked sooner.    . Influenza A by PCR 05/17/2020 NEGATIVE  NEGATIVE Final  . Influenza B by PCR 05/17/2020 NEGATIVE  NEGATIVE Final   Comment: (NOTE) The Xpert Xpress SARS-CoV-2/FLU/RSV assay is intended as an aid in  the diagnosis of influenza from Nasopharyngeal swab specimens and  should not be used as a sole basis for treatment. Nasal washings and  aspirates are unacceptable for Xpert Xpress SARS-CoV-2/FLU/RSV  testing.  Fact Sheet for Patients: https://www.moore.com/  Fact Sheet for Healthcare Providers: https://www.young.biz/  This test is not yet approved or cleared by the [place] FDA and  has been authorized for detection and/or diagnosis of SARS-CoV-2 by  FDA under an Emergency Use Authorization (EUA). This EUA will remain  in effect (meaning this test can be used) for the duration of the    Covid-19 declaration under Section 564(b)(1) of the Act, 21  U.S.C. section 360bbb-3(b)(1), unless the authorization is  terminated or revoked. Performed at [facility], [address], [place], [place] [address]   . Color, Urine 05/17/2020 YELLOW  YELLOW Final  . APPearance 05/17/2020 CLOUDY* CLEAR Final  . Specific Gravity, Urine 05/17/2020 1.012  1.005 - 1.030 Final  . pH 05/17/2020 8.0  5.0 - 8.0 Final  . Glucose, UA 05/17/2020 NEGATIVE  NEGATIVE mg/dL Final  . Hgb urine dipstick 05/17/2020 NEGATIVE  NEGATIVE Final  . Bilirubin Urine 05/17/2020 NEGATIVE  NEGATIVE Final  . Ketones, ur 05/17/2020 NEGATIVE  NEGATIVE mg/dL Final  . Protein, ur 79/10/8331 NEGATIVE  NEGATIVE mg/dL Final  . Nitrite 83/29/1916 NEGATIVE  NEGATIVE Final  . Glori Luis 05/17/2020 NEGATIVE  NEGATIVE Final   Performed at [facility], [address], [place], [place] [address]  . Specimen Description 05/17/2020 URINE, RANDOM   Final  . Special Requests 05/17/2020 NONE   Final  . Culture 05/17/2020    Final                   Value:NO GROWTH Performed at [facility], [address] [place], [place] [address]   . Report Status 05/17/2020 05/18/2020 FINAL   Final  Admission on 05/16/2020, Discharged on 05/16/2020  Component Date Value Ref Range Status  . Sodium 05/16/2020 142  135 - 145 mmol/L Final  . Potassium 05/16/2020 3.9  3.5 - 5.1 mmol/L Final  . Chloride 05/16/2020 105  98 - 111 mmol/L Final  . CO2 05/16/2020 28  22 - 32 mmol/L Final  . Glucose, Bld 05/16/2020 104* 70 - 99 mg/dL Final   Glucose  reference range applies only to samples taken after fasting for at least 8 hours.  . BUN 05/16/2020 10  6 - 20 mg/dL Final  . Creatinine, Ser 05/16/2020 0.96  0.61 - 1.24 mg/dL Final  . Calcium 16/05/9603 9.0  8.9 - 10.3 mg/dL Final  . GFR, Estimated 05/16/2020 >60  >60 mL/min Final  . Anion gap 05/16/2020 9  5 - 15 Final   Performed at [facility], [address], [place], [place] [address]  . WBC 05/16/2020 8.5  4.0 - 10.5 K/uL Final  . RBC 05/16/2020 4.62  4.22 - 5.81 MIL/uL Final  . Hemoglobin 05/16/2020 13.9  13.0 - 17.0 g/dL Final  . HCT 11/91/4782 42.3  39 - 52 % Final  . MCV 05/16/2020 91.6  80.0 - 100.0 fL Final  . MCH 05/16/2020 30.1  26.0 - 34.0 pg Final  . MCHC 05/16/2020 32.9  30.0 - 36.0 g/dL Final  . RDW 95/62/1308 14.5  11.5 - 15.5 % Final  . Platelets 05/16/2020 280  150 - 400 K/uL Final  . nRBC 05/16/2020 0.0  0.0 - 0.2 % Final   Performed at [facility], [address], [place], [place] [address]  . Troponin I (High Sensitivity) 05/16/2020 <2  <18 ng/L Final   Comment: (NOTE) Elevated high sensitivity troponin I (hsTnI) values and significant  changes across serial measurements may suggest ACS but many other  chronic and acute conditions are known to elevate hsTnI results.  Refer to the "Links" section for chest pain algorithms and additional  guidance. Performed at [facility], [address], [place], [place] [address]   Admission on 05/09/2020, Discharged on 05/09/2020  Component Date Value Ref Range Status  . Sodium 05/09/2020 136  135 - 145 mmol/L Final  . Potassium 05/09/2020 4.8  3.5 - 5.1 mmol/L Final  . Chloride 05/09/2020 97* 98 - 111 mmol/L Final  . CO2 05/09/2020 28  22 - 32 mmol/L Final  . Glucose, Bld 05/09/2020 83  70 - 99 mg/dL Final   Glucose reference range applies only to samples taken after fasting for at least 8 hours.  . BUN 05/09/2020 12  6 - 20 mg/dL Final  . Creatinine, Ser 05/09/2020 0.83  0.61 - 1.24 mg/dL Final  . Calcium 52/84/1324 9.5  8.9 - 10.3 mg/dL Final  . GFR calc non Af Amer 05/09/2020 >60  >60 mL/min Final  . GFR calc Af Amer 05/09/2020 >60  >60 mL/min Final  . Anion gap 05/09/2020 11  5 - 15 Final   Performed at [facility], [address] [place], [place] [address]  . WBC 05/09/2020 13.1* 4.0 - 10.5 K/uL Final  . RBC  05/09/2020 4.55  4.22 - 5.81 MIL/uL Final  . Hemoglobin 05/09/2020 13.7  13.0 - 17.0 g/dL Final  . HCT 72/53/6644 41.1  39 - 52 % Final  . MCV 05/09/2020 90.3  80.0 - 100.0 fL Final  . MCH 05/09/2020 30.1  26.0 - 34.0 pg Final  . MCHC 05/09/2020 33.3  30.0 - 36.0 g/dL Final  . RDW 03/47/4259 14.1  11.5 - 15.5 % Final  . Platelets 05/09/2020 387  150 - 400 K/uL Final  . nRBC 05/09/2020 0.0  0.0 - 0.2 % Final  . Neutrophils Relative % 05/09/2020 74  % Final  . Neutro Abs 05/09/2020 9.9* 1.7 - 7.7 K/uL Final  . Lymphocytes Relative 05/09/2020 16  % Final  . Lymphs Abs  05/09/2020 2.1  0.7 - 4.0 K/uL Final  . Monocytes Relative 05/09/2020 7  % Final  . Monocytes Absolute 05/09/2020 0.9  0.1 - 1.0 K/uL Final  . Eosinophils Relative 05/09/2020 1  % Final  . Eosinophils Absolute 05/09/2020 0.1  0.0 - 0.5 K/uL Final  . Basophils Relative 05/09/2020 1  % Final  . Basophils Absolute 05/09/2020 0.1  0.0 - 0.1 K/uL Final  . Immature Granulocytes 05/09/2020 1  % Final  . Abs Immature Granulocytes 05/09/2020 0.07  0.00 - 0.07 K/uL Final   Performed at [facility], [address] [place], [place] [address]  Admission on 05/04/2020, Discharged on 05/08/2020  Component Date Value Ref Range Status  . Glucose-Capillary 05/04/2020 95  70 - 99 mg/dL Final   Glucose reference range applies only to samples taken after fasting for at least 8 hours.  . Glucose-Capillary 05/04/2020 106* 70 - 99 mg/dL Final   Glucose reference range applies only to samples taken after fasting for at least 8 hours.  . Glucose-Capillary 05/05/2020 78  70 - 99 mg/dL Final   Glucose reference range applies only to samples taken after fasting for at least 8 hours.  . Comment 1 05/05/2020 Notify RN   Final  . Comment 2 05/05/2020 Document in Chart   Final  . Glucose-Capillary 05/05/2020 84  70 - 99 mg/dL Final   Glucose reference range applies only to samples taken after fasting for at least 8 hours.  .  Glucose-Capillary 05/05/2020 89  70 - 99 mg/dL Final   Glucose reference range applies only to samples taken after fasting for at least 8 hours.  . Comment 1 05/05/2020 Notify RN   Final  . Comment 2 05/05/2020 Document in Chart   Final  . Glucose-Capillary 05/06/2020 90  70 - 99 mg/dL Final   Glucose reference range applies only to samples taken after fasting for at least 8 hours.  . Glucose-Capillary 05/06/2020 105* 70 - 99 mg/dL Final   Glucose reference range applies only to samples taken after fasting for at least 8 hours.  . Glucose-Capillary 05/06/2020 87  70 - 99 mg/dL Final   Glucose reference range applies only to samples taken after fasting for at least 8 hours.  . Glucose-Capillary 05/06/2020 108* 70 - 99 mg/dL Final   Glucose reference range applies only to samples taken after fasting for at least 8 hours.  . Glucose-Capillary 05/07/2020 89  70 - 99 mg/dL Final   Glucose reference range applies only to samples taken after fasting for at least 8 hours.  Admission on 05/03/2020, Discharged on 05/04/2020  Component Date Value Ref Range Status  . SARS Coronavirus 2 05/03/2020 NEGATIVE  NEGATIVE Final   Comment: (NOTE) SARS-CoV-2 target nucleic acids are NOT DETECTED.  The SARS-CoV-2 RNA is generally detectable in upper and lower respiratory specimens during the acute phase of infection. The lowest concentration of SARS-CoV-2 viral copies this assay can detect is 250 copies / mL. A negative result does not preclude SARS-CoV-2 infection and should not be used as the sole basis for treatment or other patient management decisions.  A negative result may occur with improper specimen collection / handling, submission of specimen other than nasopharyngeal swab, presence of viral mutation(s) within the areas targeted by this assay, and inadequate number of viral copies (<250 copies / mL). A negative result must be combined with clinical observations, patient history, and  epidemiological information.  Fact Sheet for Patients:   BoilerBrush.com.cy  Fact Sheet for Healthcare Providers: https://pope.com/  This test is not yet approved or                           cleared by the [place] FDA and has been authorized for detection and/or diagnosis of SARS-CoV-2 by FDA under an Emergency Use Authorization (EUA).  This EUA will remain in effect (meaning this test can be used) for the duration of the COVID-19 declaration under Section 564(b)(1) of the Act, 21 U.S.C. section 360bbb-3(b)(1), unless the authorization is terminated or revoked sooner.  Performed at [facility], [address] [place], [place] [address]   . WBC 05/03/2020 12.6* 4.0 - 10.5 K/uL Final  . RBC 05/03/2020 4.29  4.22 - 5.81 MIL/uL Final  . Hemoglobin 05/03/2020 12.4* 13.0 - 17.0 g/dL Final  . HCT 21/30/8657 39.7  39 - 52 % Final  . MCV 05/03/2020 92.5  80.0 - 100.0 fL Final  . MCH 05/03/2020 28.9  26.0 - 34.0 pg Final  . MCHC 05/03/2020 31.2  30.0 - 36.0 g/dL Final  . RDW 84/69/6295 13.5  11.5 - 15.5 % Final  . Platelets 05/03/2020 613* 150 - 400 K/uL Final  . nRBC 05/03/2020 0.0  0.0 - 0.2 % Final  . Neutrophils Relative % 05/03/2020 71  % Final  . Neutro Abs 05/03/2020 9.1* 1.7 - 7.7 K/uL Final  . Lymphocytes Relative 05/03/2020 15  % Final  . Lymphs Abs 05/03/2020 1.8  0.7 - 4.0 K/uL Final  . Monocytes Relative 05/03/2020 9  % Final  . Monocytes Absolute 05/03/2020 1.1* 0.1 - 1.0 K/uL Final  . Eosinophils Relative 05/03/2020 3  % Final  . Eosinophils Absolute 05/03/2020 0.4  0.0 - 0.5 K/uL Final  . Basophils Relative 05/03/2020 1  % Final  . Basophils Absolute 05/03/2020 0.1  0.0 - 0.1 K/uL Final  . Immature Granulocytes 05/03/2020 1  % Final  . Abs Immature Granulocytes 05/03/2020 0.06  0.00 - 0.07 K/uL Final   Performed at [facility], [address] [place], [place] [address]  . Hgb A1c MFr Bld 05/03/2020  12.0* 4.8 - 5.6 % Final   Comment: (NOTE)         Prediabetes: 5.7 - 6.4         Diabetes: >6.4         Glycemic control for adults with diabetes: <7.0   . Mean Plasma Glucose 05/03/2020 298  mg/dL Final   Comment: (NOTE) Performed At: [facility] [address] [place], [place] [id number] Jolene Schimke MD ZD:[id number]   . Alcohol, Ethyl (B) 05/03/2020 <10  <10 mg/dL Final   Comment: (NOTE) Lowest detectable limit for serum alcohol is 10 mg/dL.  For medical purposes only. Performed at [facility], [address], [place], [place] [address]   . TSH 05/03/2020 0.419  0.350 - 4.500 uIU/mL Final   Comment: Performed by a 3rd Generation assay with a functional sensitivity of <=0.01 uIU/mL. Performed at [facility], [address] [place], [place] [address]   . Sodium 05/03/2020 138  135 - 145 mmol/L Final  . Potassium 05/03/2020 4.0  3.5 - 5.1 mmol/L Final  . Chloride 05/03/2020 101  98 - 111 mmol/L Final  . CO2 05/03/2020 27  22 - 32 mmol/L Final  . Glucose, Bld 05/03/2020 87  70 - 99 mg/dL Final   Glucose reference range applies only to samples  taken after fasting for at least 8 hours.  . BUN 05/03/2020 9  6 - 20 mg/dL Final  . Creatinine, Ser 05/03/2020 0.94  0.61 - 1.24 mg/dL Final  . Calcium 91/47/8295 8.9  8.9 - 10.3 mg/dL Final  . Total Protein 05/03/2020 7.0  6.5 - 8.1 g/dL Final  . Albumin 62/13/0865 2.9* 3.5 - 5.0 g/dL Final  . AST 78/46/9629 21  15 - 41 U/L Final  . ALT 05/03/2020 21  0 - 44 U/L Final  . Alkaline Phosphatase 05/03/2020 91  38 - 126 U/L Final  . Total Bilirubin 05/03/2020 1.0  0.3 - 1.2 mg/dL Final  . GFR calc non Af Amer 05/03/2020 >60  >60 mL/min Final  . GFR calc Af Amer 05/03/2020 >60  >60 mL/min Final  . Anion gap 05/03/2020 10  5 - 15 Final   Performed at [facility], [address], [place], [place] [address]  . POC Amphetamine UR 05/03/2020 None Detected  None Detected Final  . POC Secobarbital (BAR) 05/03/2020  None Detected  None Detected Final  . POC Buprenorphine (BUP) 05/03/2020 None Detected  None Detected Final  . POC Oxazepam (BZO) 05/03/2020 None Detected  None Detected Final  . POC Cocaine UR 05/03/2020 Positive* None Detected Final  . POC Methamphetamine UR 05/03/2020 None Detected  None Detected Final  . POC Morphine 05/03/2020 None Detected  None Detected Final  . POC Oxycodone UR 05/03/2020 Positive* None Detected Final  . POC Methadone UR 05/03/2020 None Detected  None Detected Final  . POC Marijuana UR 05/03/2020 Positive* None Detected Final  . SARS Coronavirus 2 Ag 05/03/2020 Negative  Negative Final  Admission on 04/22/2020, Discharged on 04/30/2020  Component Date Value Ref Range Status  . Sodium 04/22/2020 138  135 - 145 mmol/L Final  . Potassium 04/22/2020 4.1  3.5 - 5.1 mmol/L Final  . Chloride 04/22/2020 104  98 - 111 mmol/L Final  . CO2 04/22/2020 25  22 - 32 mmol/L Final  . Glucose, Bld 04/22/2020 98  70 - 99 mg/dL Final   Glucose reference range applies only to samples taken after fasting for at least 8 hours.  . BUN 04/22/2020 11  6 - 20 mg/dL Final  . Creatinine, Ser 04/22/2020 0.66  0.61 - 1.24 mg/dL Final  . Calcium 32/44/0102 8.7* 8.9 - 10.3 mg/dL Final  . GFR calc non Af Amer 04/22/2020 >60  >60 mL/min Final  . GFR calc Af Amer 04/22/2020 >60  >60 mL/min Final  . Anion gap 04/22/2020 9  5 - 15 Final   Performed at [facility], [address] [place], [place] [address]  . WBC 04/22/2020 15.5* 4.0 - 10.5 K/uL Final  . RBC 04/22/2020 3.86* 4.22 - 5.81 MIL/uL Final  . Hemoglobin 04/22/2020 11.8* 13.0 - 17.0 g/dL Final  . HCT 64/40/3474 37.2* 39 - 52 % Final  . MCV 04/22/2020 96.4  80.0 - 100.0 fL Final  . MCH 04/22/2020 30.6  26.0 - 34.0 pg Final  . MCHC 04/22/2020 31.7  30.0 - 36.0 g/dL Final  . RDW 25/95/6387 14.2  11.5 - 15.5 % Final  . Platelets 04/22/2020 646* 150 - 400 K/uL Final  . nRBC 04/22/2020 0.0  0.0 - 0.2 % Final   Performed  at [facility], [address], [place], [place] [address]  . Troponin I (High Sensitivity) 04/22/2020 <2  <18 ng/L Final   Comment: (NOTE) Elevated high sensitivity troponin I (hsTnI)  values and significant  changes across serial measurements may suggest ACS but many other  chronic and acute conditions are known to elevate hsTnI results.  Refer to the "Links" section for chest pain algorithms and additional  guidance. Performed at [facility], [address] [place], [place] [address]   . WBC 04/23/2020 12.5* 4.0 - 10.5 K/uL Final  . RBC 04/23/2020 3.87* 4.22 - 5.81 MIL/uL Final  . Hemoglobin 04/23/2020 11.6* 13.0 - 17.0 g/dL Final  . HCT 60/45/4098 36.4* 39 - 52 % Final  . MCV 04/23/2020 94.1  80.0 - 100.0 fL Final  . MCH 04/23/2020 30.0  26.0 - 34.0 pg Final  . MCHC 04/23/2020 31.9  30.0 - 36.0 g/dL Final  . RDW 11/91/4782 14.1  11.5 - 15.5 % Final  . Platelets 04/23/2020 712* 150 - 400 K/uL Final  . nRBC 04/23/2020 0.0  0.0 - 0.2 % Final   Performed at [facility], [address] [place], [place] [address]  . Sodium 04/23/2020 136  135 - 145 mmol/L Final  . Potassium 04/23/2020 3.8  3.5 - 5.1 mmol/L Final  . Chloride 04/23/2020 102  98 - 111 mmol/L Final  . CO2 04/23/2020 26  22 - 32 mmol/L Final  . Glucose, Bld 04/23/2020 95  70 - 99 mg/dL Final   Glucose reference range applies only to samples taken after fasting for at least 8 hours.  . BUN 04/23/2020 5* 6 - 20 mg/dL Final  . Creatinine, Ser 04/23/2020 0.71  0.61 - 1.24 mg/dL Final  . Calcium 30/86/5784 8.5* 8.9 - 10.3 mg/dL Final  . GFR calc non Af Amer 04/23/2020 >60  >60 mL/min Final  . GFR calc Af Amer 04/23/2020 >60  >60 mL/min Final  . Anion gap 04/23/2020 8  5 - 15 Final   Performed at [facility], [address], [place], [place] [address]  . SARS Coronavirus 2 04/23/2020 NEGATIVE  NEGATIVE Final   Comment: (NOTE) SARS-CoV-2 target nucleic acids are NOT  DETECTED.  The SARS-CoV-2 RNA is generally detectable in upper and lower respiratory specimens during the acute phase of infection. The lowest concentration of SARS-CoV-2 viral copies this assay can detect is 250 copies / mL. A negative result does not preclude SARS-CoV-2 infection and should not be used as the sole basis for treatment or other patient management decisions.  A negative result may occur with improper specimen collection / handling, submission of specimen other than nasopharyngeal swab, presence of viral mutation(s) within the areas targeted by this assay, and inadequate number of viral copies (<250 copies / mL). A negative result must be combined with clinical observations, patient history, and epidemiological information.  Fact Sheet for Patients:   BoilerBrush.com.cy  Fact Sheet for Healthcare Providers: https://pope.com/  This test is not yet approved or                           cleared by the [place] FDA and has been authorized for detection and/or diagnosis of SARS-CoV-2 by FDA under an Emergency Use Authorization (EUA).  This EUA will remain in effect (meaning this test can be used) for the duration of the COVID-19 declaration under Section 564(b)(1) of the Act, 21 U.S.C. section 360bbb-3(b)(1), unless the authorization is terminated or revoked sooner.  Performed at [facility], [address] [place], [place] [address]   . Prothrombin Time 04/26/2020 14.5  11.4 - 15.2  seconds Final  . INR 04/26/2020 1.2  0.8 - 1.2 Final   Comment: (NOTE) INR goal varies based on device and disease states. Performed at [facility], [address] [place], [place] [address]   . aPTT 04/26/2020 34  24 - 36 seconds Final   Performed at [facility], [address] [place], [place] [address]  . WBC 04/27/2020 14.2* 4.0 - 10.5 K/uL Final  . RBC 04/27/2020 4.47  4.22 - 5.81 MIL/uL Final  .  Hemoglobin 04/27/2020 13.1  13.0 - 17.0 g/dL Final  . HCT 82/95/6213 40.5  39 - 52 % Final  . MCV 04/27/2020 90.6  80.0 - 100.0 fL Final  . MCH 04/27/2020 29.3  26.0 - 34.0 pg Final  . MCHC 04/27/2020 32.3  30.0 - 36.0 g/dL Final  . RDW 08/65/7846 13.3  11.5 - 15.5 % Final  . Platelets 04/27/2020 767* 150 - 400 K/uL Final  . nRBC 04/27/2020 0.0  0.0 - 0.2 % Final   Performed at [facility], [address], [place], [place] [address]  . Sodium 04/27/2020 137  135 - 145 mmol/L Final  . Potassium 04/27/2020 3.9  3.5 - 5.1 mmol/L Final  . Chloride 04/27/2020 102  98 - 111 mmol/L Final  . CO2 04/27/2020 25  22 - 32 mmol/L Final  . Glucose, Bld 04/27/2020 89  70 - 99 mg/dL Final   Glucose reference range applies only to samples taken after fasting for at least 8 hours.  . BUN 04/27/2020 11  6 - 20 mg/dL Final  . Creatinine, Ser 04/27/2020 0.87  0.61 - 1.24 mg/dL Final  . Calcium 28/41/3244 8.8* 8.9 - 10.3 mg/dL Final  . GFR calc non Af Amer 04/27/2020 >60  >60 mL/min Final  . GFR calc Af Amer 04/27/2020 >60  >60 mL/min Final  . Anion gap 04/27/2020 10  5 - 15 Final   Performed at [facility], [address] [place], [place] [address]  . Specimen Description 04/26/2020 FLUID RIGHT PLEURAL   Final  . Special Requests 04/26/2020 Normal   Final  . Gram Stain 04/26/2020    Final                   Value:RARE WBC PRESENT, PREDOMINANTLY PMN NO ORGANISMS SEEN   . Culture 04/26/2020    Final                   Value:No growth aerobically or anaerobically. Performed at [facility], [address], [place], [place] [address]   . Report Status 04/26/2020 05/01/2020 FINAL   Final  . WBC 04/28/2020 13.9* 4.0 - 10.5 K/uL Final  . RBC 04/28/2020 4.21* 4.22 - 5.81 MIL/uL Final  . Hemoglobin 04/28/2020 12.2* 13.0 - 17.0 g/dL Final  . HCT 44/10/4740 37.9* 39 - 52 % Final  . MCV 04/28/2020 90.0  80.0 - 100.0 fL Final  . MCH 04/28/2020 29.0  26.0 - 34.0 pg Final  . MCHC 04/28/2020  32.2  30.0 - 36.0 g/dL Final  . RDW 59/56/3875 13.2  11.5 - 15.5 % Final  . Platelets 04/28/2020 750* 150 - 400 K/uL Final  . nRBC 04/28/2020 0.0  0.0 - 0.2 % Final   Performed at [facility], [address] [place], [place] [address]  . WBC 04/29/2020 12.9* 4.0 - 10.5 K/uL Final  . RBC 04/29/2020 4.06* 4.22 - 5.81 MIL/uL Final  .  Hemoglobin 04/29/2020 11.8* 13.0 - 17.0 g/dL Final  . HCT 16/10/960409/23/2021 36.6* 39 - 52 % Final  . MCV 04/29/2020 90.1  80.0 - 100.0 fL Final  . MCH 04/29/2020 29.1  26.0 - 34.0 pg Final  . MCHC 04/29/2020 32.2  30.0 - 36.0 g/dL Final  . RDW 54/09/811909/23/2021 13.2  11.5 - 15.5 % Final  . Platelets 04/29/2020 695* 150 - 400 K/uL Final  . nRBC 04/29/2020 0.0  0.0 - 0.2 % Final   Performed at [facility], [address] [place], [place] [address]  . WBC 04/30/2020 12.3* 4.0 - 10.5 K/uL Final  . RBC 04/30/2020 4.35  4.22 - 5.81 MIL/uL Final  . Hemoglobin 04/30/2020 12.4* 13.0 - 17.0 g/dL Final  . HCT 95/62/130809/24/2021 39.1  39 - 52 % Final  . MCV 04/30/2020 89.9  80.0 - 100.0 fL Final  . MCH 04/30/2020 28.5  26.0 - 34.0 pg Final  . MCHC 04/30/2020 31.7  30.0 - 36.0 g/dL Final  . RDW 65/78/469609/24/2021 13.2  11.5 - 15.5 % Final  . Platelets 04/30/2020 717* 150 - 400 K/uL Final  . nRBC 04/30/2020 0.0  0.0 - 0.2 % Final   Performed at [facility], [address] [place], [place] [address]  . Sodium 04/30/2020 139  135 - 145 mmol/L Final  . Potassium 04/30/2020 4.2  3.5 - 5.1 mmol/L Final  . Chloride 04/30/2020 100  98 - 111 mmol/L Final  . CO2 04/30/2020 28  22 - 32 mmol/L Final  . Glucose, Bld 04/30/2020 95  70 - 99 mg/dL Final   Glucose reference range applies only to samples taken after fasting for at least 8 hours.  . BUN 04/30/2020 9  6 - 20 mg/dL Final  . Creatinine, Ser 04/30/2020 0.86  0.61 - 1.24 mg/dL Final  . Calcium 41/32/440109/24/2021 9.1  8.9 - 10.3 mg/dL Final  . GFR calc non Af Amer 04/30/2020 >60  >60 mL/min Final  . GFR calc Af Amer 04/30/2020 >60  >60  mL/min Final  . Anion gap 04/30/2020 11  5 - 15 Final   Performed at [facility], [address] [place], [place] [address]    Allergies: Tylenol [acetaminophen]  PTA Medications: (Not in a hospital admission)   Medical Decision Making  Patient was medically cleared in the emergency department  Continue home medications -trileptal 150 mg BID for mood stability -Seroquel XR 300 mg QHS for mood stability -gabapentin 400 mg TID for neuropathy -duloxetine 60 mg daily for depression  Since patient did not receive medications yesterday, will give one time dose of Seroquel XR 100 mg now and give morning doses of gabapentin and trileptal now.     Recommendations  Based on my evaluation the patient does not appear to have an emergency medical condition.   Patient will be placed in [facility] at [facility] for treatment and stabilization. He will be reevaluated on 06/03/2020. The treatment team will determine disposition at that time.      Jackelyn PolingJason A Millianna Szymborski, NP 06/03/20  6:19 AM

## 2020-06-03 NOTE — Discharge Instructions (Addendum)
Take all of your medications as prescribed by your mental healthcare provider.  Report any adverse effects and reactions from your medications to your outpatient provider promptly.  Do not engage in alcohol and or illegal drug use while on prescription medicines. Keep all scheduled appointments. This is to ensure that you are getting refills on time and to avoid any interruption in your medication.  If you are unable to keep an appointment call to reschedule.  Be sure to follow up with resources and follow ups given. In the event of worsening symptoms call the crisis hotline, 911, and or go to the nearest emergency department for appropriate evaluation and treatment of symptoms. Follow-up with your primary care provider for your medical issues, concerns and or health care needs.   Substance abuse resources and Residential Options:  [facility] day residential substance abuse facility (not an option if you have active assault charges). [address], [place], [place] [address] Phone: [phone]: Ask for Shayla in admissions to complete intake if interested in pursuing this option.  [facility]: Can get intake scheduled; (not an option if you have active assault charges). [address] [place], [place] ([phone]) Call Mon-Fri.  [facility]): (offers outpatient therapy and intensive outpatient substance abuse therapy).  [address], [place], [place] [address] Phone: [phone]  [facility]: Offers FREE recovery skills classes, support groups, 1:1 Peer Support, and Compeer Classes. [address], [place], [place] [address] Phone: [phone] (Call to complete intake).   [facility] [address] [place], [place] [address] Phone: [phone] ext [phone]  [facility] provides food, shelter and other programs and services to the homeless men of [place] through our men's  program.  By offering safe shelter, three meals a day, clean clothing, Biblical counseling, financial planning, vocational training, GED/education and employment assistance, we've helped mend the shattered lives of many homeless men since opening in 1974.  We have approximately 267 beds available, with a max of 312 beds including mats for emergency situations and currently house an average of 270 men a night.  Prospective Client Check-In Information Photo ID Required (State/ Out of State/ DOC) - if photo ID is not available, clients are required to have a printout of a police/sheriff's criminal history report. Help out with chores around the Mission. No sex offender of any type (pending, charged, registered and/or any other sex related offenses) will be permitted to check in. Must be willing to abide by all rules, regulations, and policies established by the [facility]. The following will be provided - shelter, food, clothing, and biblical counseling. If you or someone you know is in need of assistance at our men's shelter in , [place], please call [phone] ext. [phone].   Homeless Shelter List:     [facility] ([facility])  [address] [place], [place]  Phone: [phone]     [facility]  [address], [place], [place] [address]  Phone: [phone]     [facility] (Women only)  [address], [place], [place] [address]  Phone: [phone]     [facility]  [address] [place], [place] [address]  Phone: [phone]     [facility]:  [address]  [place], [place] [address]  Phone: [phone]     [facility]  [address], [place], [place] [address]  (Check in at 6:00PM for placement at a local shelter)  Phone: [phone]    

## 2020-06-03 NOTE — ED Notes (Signed)
Meal given

## 2020-06-03 NOTE — ED Notes (Signed)
Patient irritable and anxious. Patient given support and encouragement. Monitoring continues and safety maintained.

## 2020-06-03 NOTE — ED Triage Notes (Signed)
Pt arrives via [employer] via [facility] with complaint of worsening depression due to father dying over the weekend. Pt reports he also ran out of medications. Pt denies SI at this time. Pt also denies HI, AVH. Pt alert & oriented, calm & cooperative, in no acute distress at this time.

## 2020-06-03 NOTE — ED Notes (Signed)
Patient's Jeans. 2 shirts, 2 boxes and a pair of socks are placed in locker 09

## 2020-06-03 NOTE — ED Notes (Signed)
TTS in process 
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
## 2020-06-07 ENCOUNTER — Ambulatory Visit: Payer: Self-pay

## 2020-06-09 ENCOUNTER — Ambulatory Visit: Payer: Self-pay | Attending: Physician Assistant | Admitting: Occupational Therapy

## 2020-06-10 ENCOUNTER — Ambulatory Visit (HOSPITAL_COMMUNITY): Payer: Self-pay | Admitting: Licensed Clinical Social Worker

## 2020-06-16 ENCOUNTER — Ambulatory Visit: Payer: Self-pay | Admitting: Occupational Therapy

## 2020-06-16 ENCOUNTER — Ambulatory Visit: Payer: Self-pay | Admitting: Family Medicine

## 2020-06-23 ENCOUNTER — Ambulatory Visit: Payer: Self-pay | Admitting: Occupational Therapy

## 2020-06-24 ENCOUNTER — Ambulatory Visit (HOSPITAL_COMMUNITY): Payer: Self-pay | Admitting: Licensed Clinical Social Worker

## 2020-06-24 NOTE — Therapy (Signed)
[facility] [address] [place], [place], [address] Phone: [phone]   Fax:  [phone]  Patient Details  Name: Daniel [name] MRN: [id number] Date of Birth: September 21, 1990 Referring Provider:  Margie Billet, PA-C Encounter Date: 06/24/2020  OCCUPATIONAL THERAPY DISCHARGE SUMMARY  Visits from Start of Care: 2  Current functional level related to goals / functional outcomes: Pt has only met goal for initial HEP   Remaining deficits: Unknown secondary to not returning to O.T. - multiple no shows (Pt was seen for initial evaluation and 1 treatment)    Education / Equipment: HEP  Plan: Patient agrees to discharge.  Patient goals were not met. Patient is being discharged due to not returning since the last visit.  ?????        Carey Bullocks, OTR/L 06/24/2020, 10:31 AM  [facility] [address] [place], [place], [address] Phone: [phone]   Fax:  [phone]

## 2020-06-29 ENCOUNTER — Encounter: Payer: Self-pay | Admitting: Occupational Therapy

## 2020-07-07 ENCOUNTER — Encounter: Payer: Self-pay | Admitting: Occupational Therapy

## 2020-07-14 ENCOUNTER — Encounter: Payer: Self-pay | Admitting: Occupational Therapy

## 2020-12-18 IMAGING — DX DG CHEST 1V PORT
1 series · 1 of 1 positions shown · non-contrast
Comparison: 04/22/2020

CLINICAL DATA: Chest tube

EXAM:
PORTABLE CHEST 1 VIEW

[chest ap]
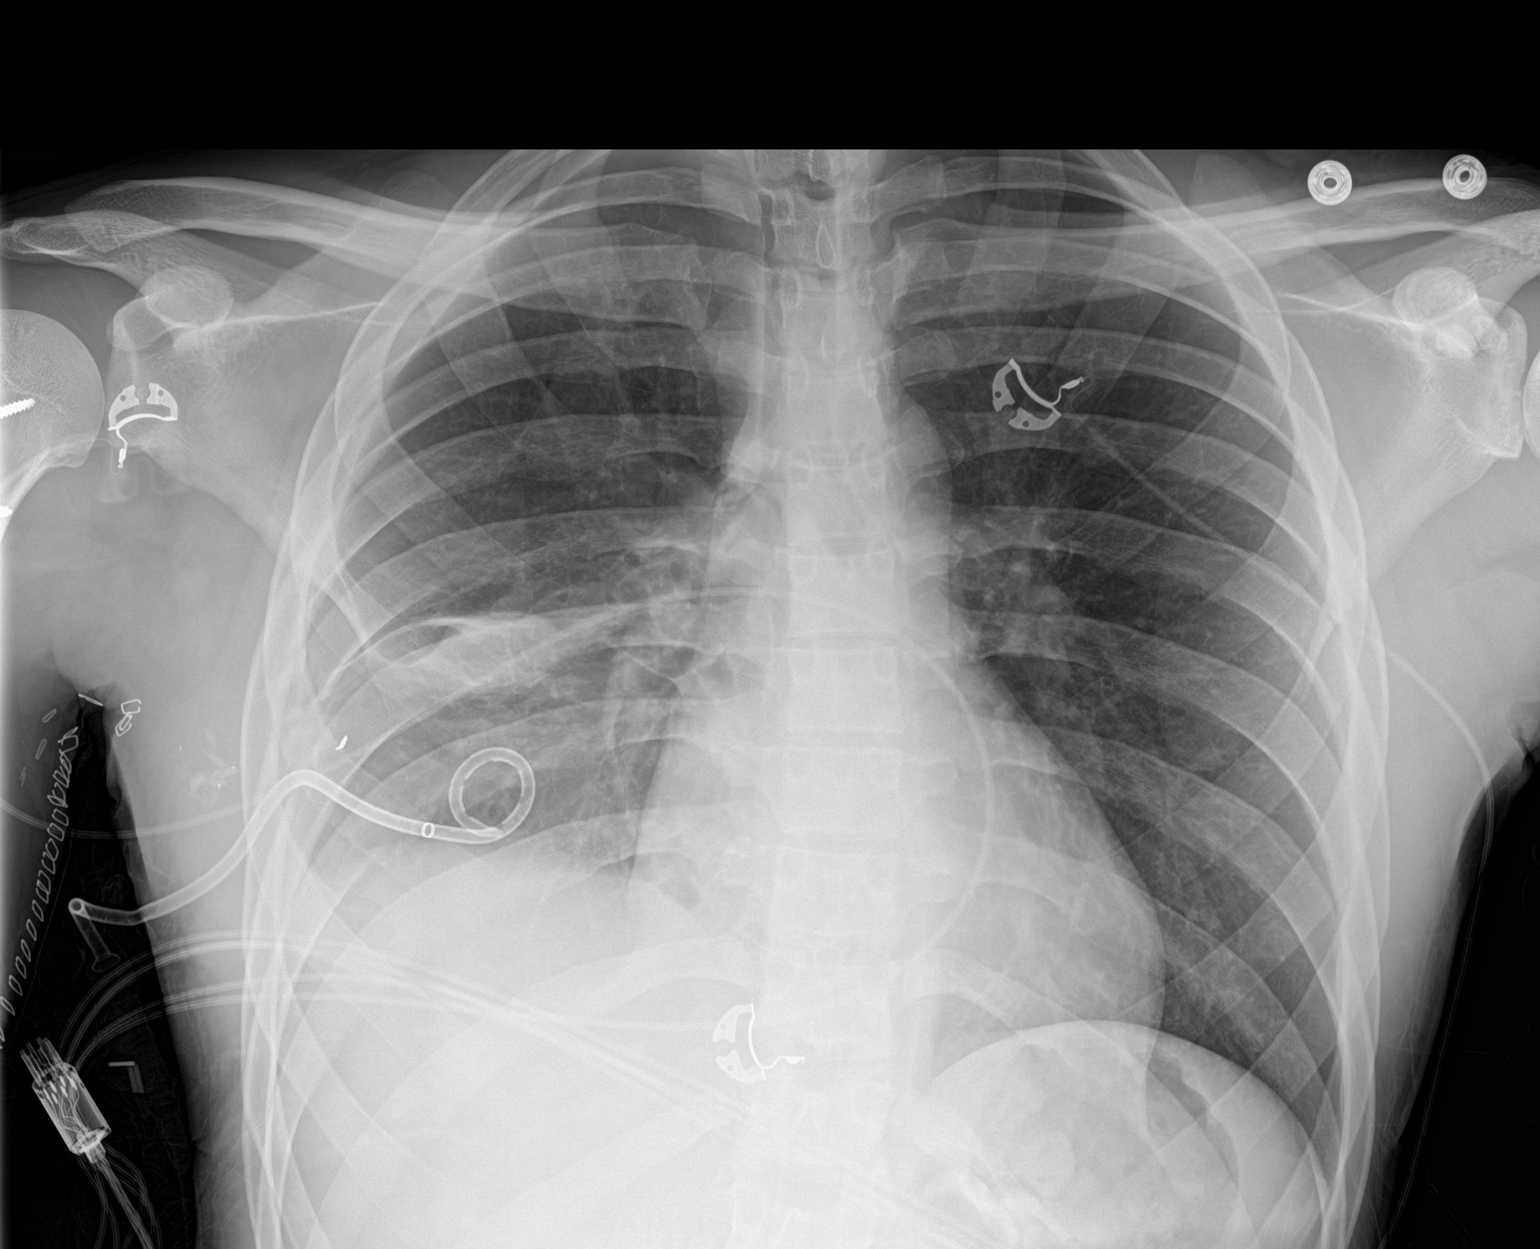

[1 of 1 positions shown; findings below may reference images not displayed]

FINDINGS: Right chest tube has been placed. Right pleural effusion remains
present. No visible pneumothorax. Right lower lobe atelectasis or
infiltrate. Left lung clear. Heart is normal size.
IMPRESSION: Interval placement of right chest tube with continued right
effusion. No visible pneumothorax.

Right lower lobe atelectasis or infiltrate.

Findings similar to prior study.

## 2020-12-20 IMAGING — DX DG CHEST 1V PORT
1 series · 1 of 1 positions shown · non-contrast
Comparison: 04/28/2020

CLINICAL DATA: Abnormal respiration

EXAM:
PORTABLE CHEST 1 VIEW

[chest ap]
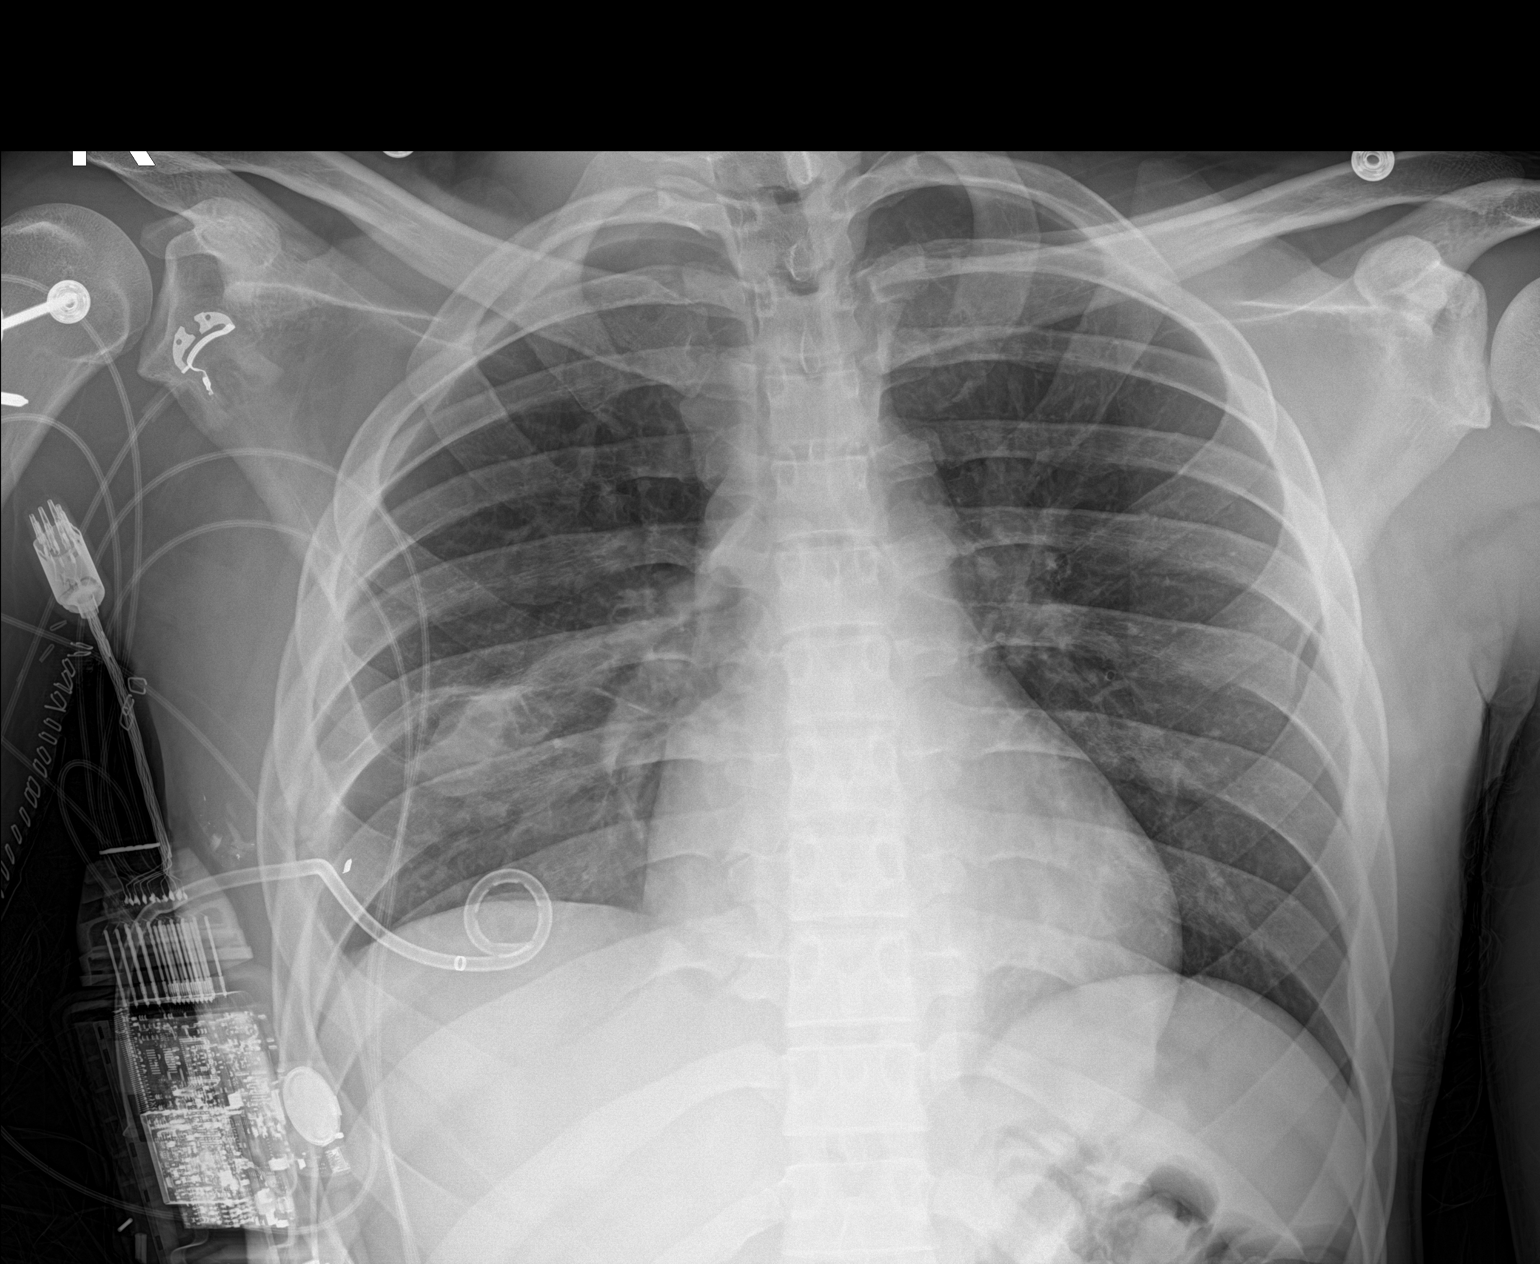

[1 of 1 positions shown; findings below may reference images not displayed]

FINDINGS: Right basilar chest tube is unchanged. Opacity within the right mid
lung zone likely reflects fluid within the right major fissure.
Small pleural fluid persists within the apical and lateral pleural
space. Mild right-sided volume loss. No pneumothorax. Lungs are
otherwise clear. No pleural effusion on the left. Cardiac size
within normal limits. Pulmonary vascularity is normal.
IMPRESSION: Unchanged right basilar chest tube. Small right pleural effusion
partially loculated within the fissure. Stable right-sided volume
loss.

## 2020-12-21 IMAGING — DX DG CHEST 1V PORT
1 series · 1 of 1 positions shown · non-contrast
Comparison: April 30, 2020 study obtained earlier in the day

CLINICAL DATA: Chest tube removal

EXAM:
PORTABLE CHEST 1 VIEW

[chest ap]
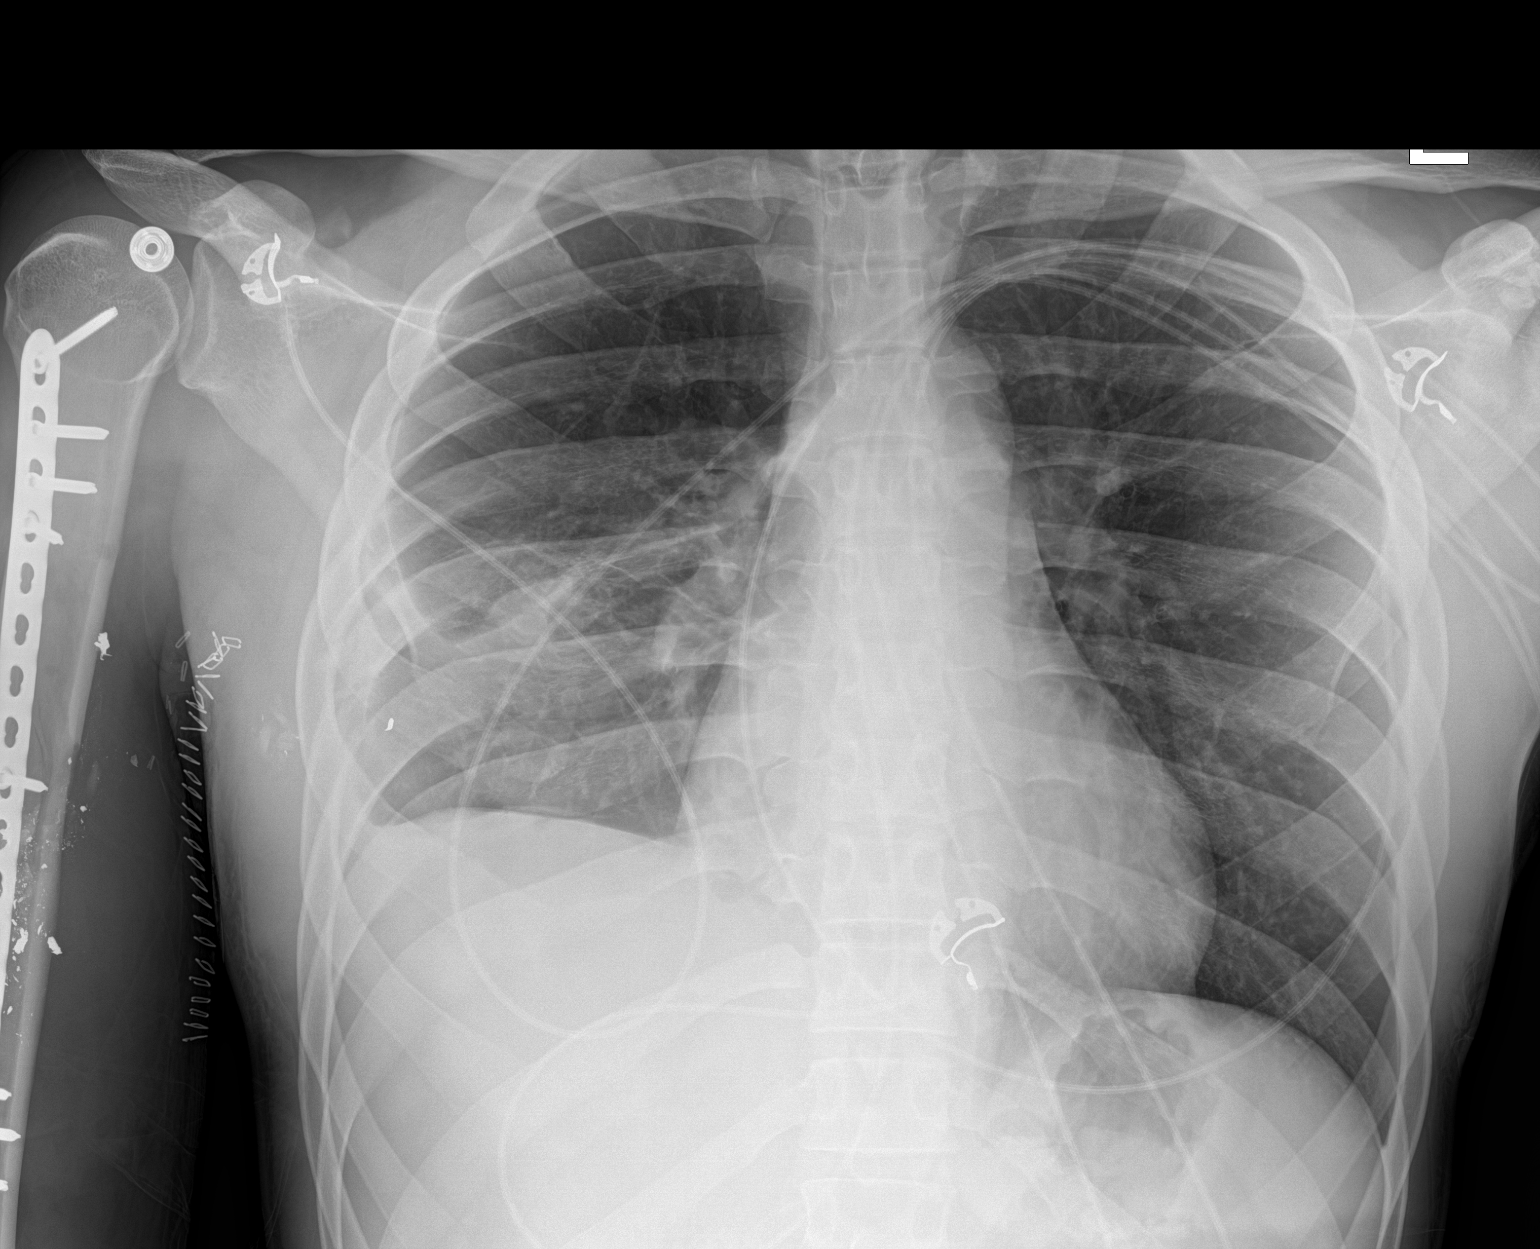

[1 of 1 positions shown; findings below may reference images not displayed]

FINDINGS: Chest tube has been removed from the right side. No appreciable
pneumothorax. There is a small right pleural effusion with
ill-defined airspace opacity in the right mid and lower lung zones
most likely due to atelectatic change and mild loculated effusion.
Lungs elsewhere are clear. Heart size and pulmonary vascularity are
normal. No adenopathy. Several small metallic foreign bodies are
noted in the lower right hemithorax with evidence of prior trauma
involving the right lateral seventh rib. Skin staples are seen in
this area. Postoperative change noted in the right humerus.
IMPRESSION: No pneumothorax following right chest tube removal. Small pleural
effusion with probable combination of atelectasis and loculated
effusion in the right mid and lower lung regions. Evidence of recent
trauma involving the lateral right seventh rib noted with several
small metallic foreign bodies in this area.

No new lung opacity. Left lung clear. Heart size normal.
Postoperative change noted with evidence of prior trauma right
humerus.

## 2020-12-30 IMAGING — CR DG CHEST 2V
2 series · 2 of 2 positions shown · non-contrast
Comparison: 04/30/2020

CLINICAL DATA: Shortness of breath

EXAM:
CHEST - 2 VIEW

[w chest pa]
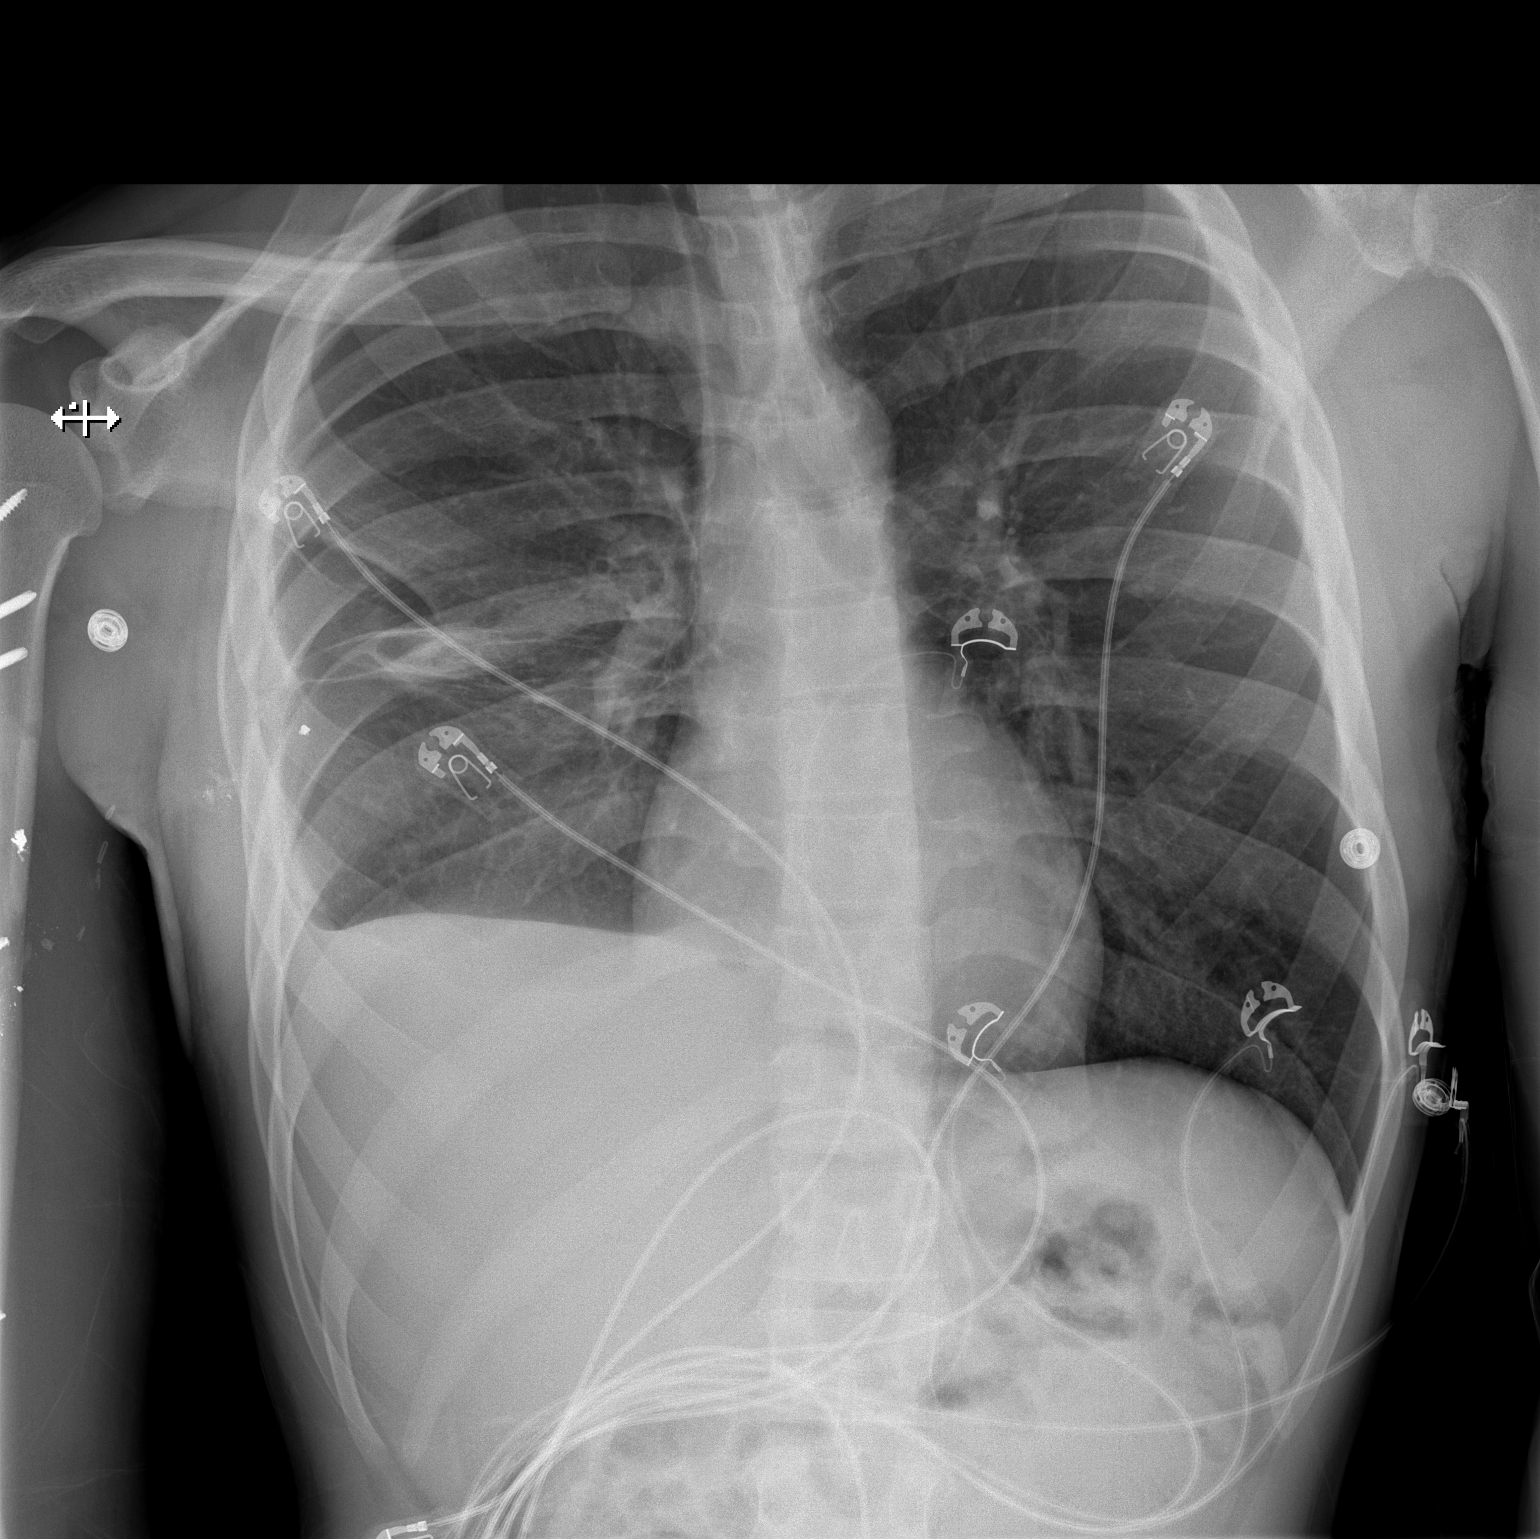

[w chest lat]
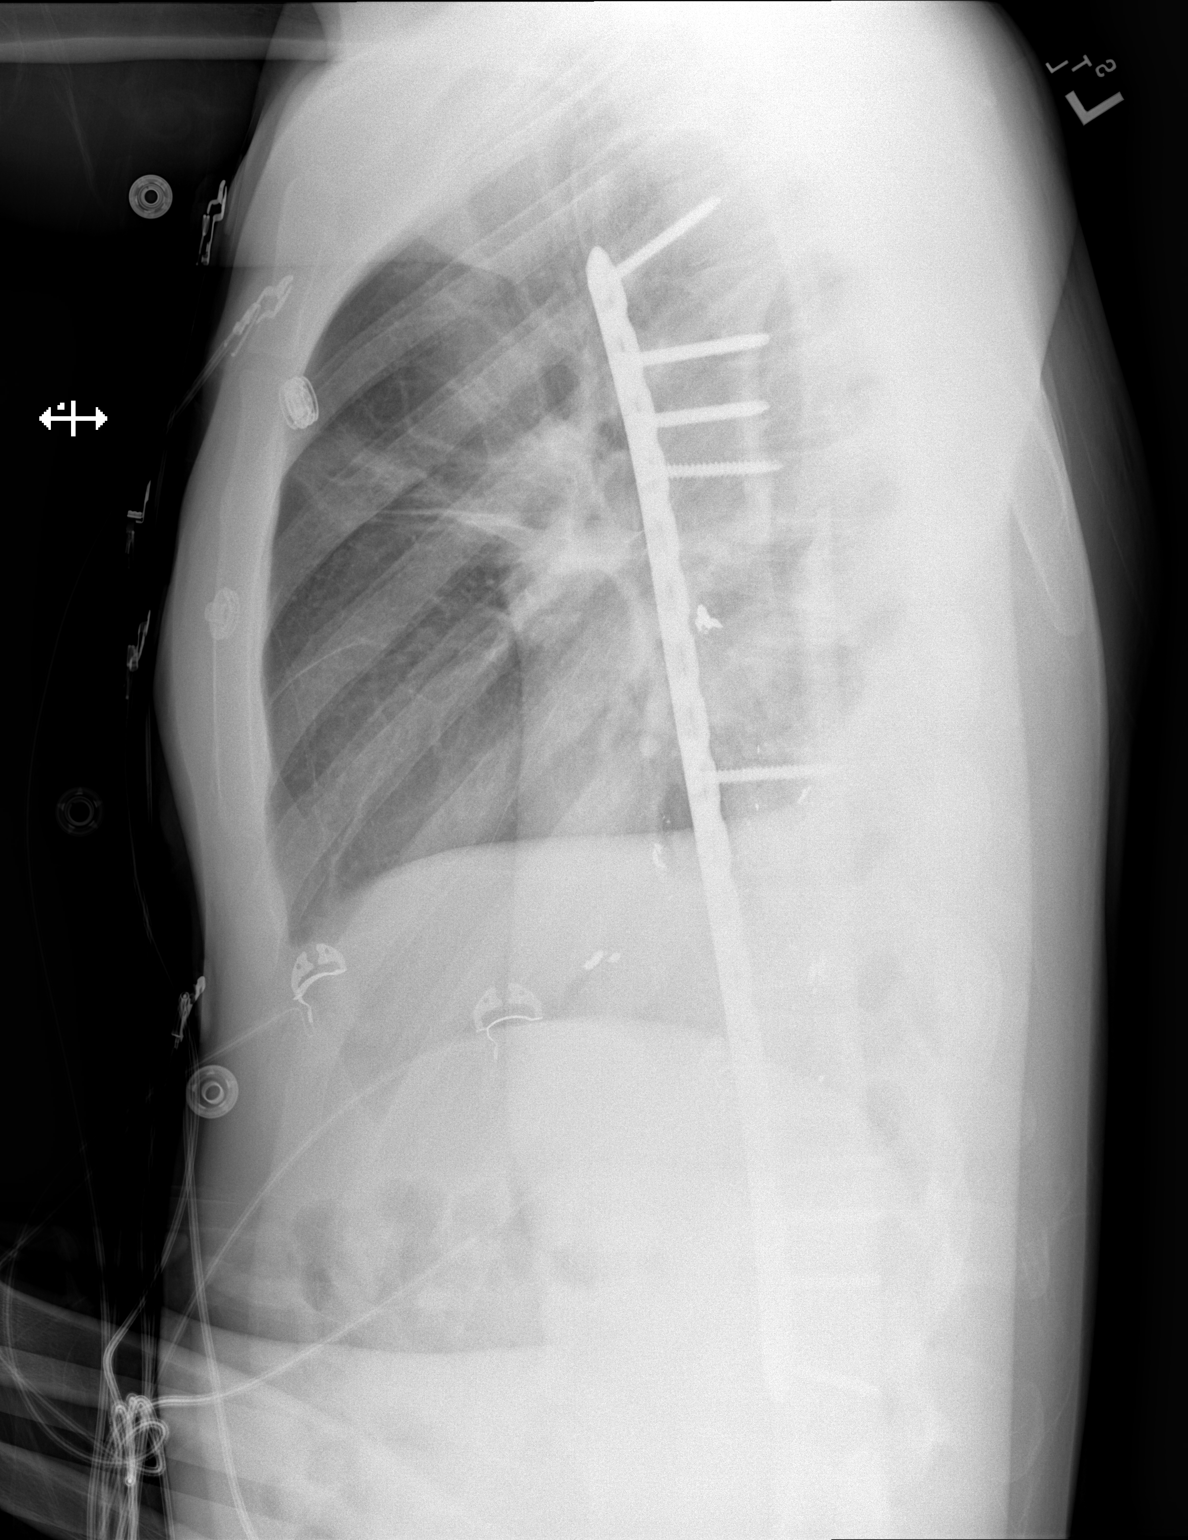

[2 of 2 positions shown; findings below may reference images not displayed]

FINDINGS: Interval removal of a right-sided chest tube. There is a persistent,
small to moderate right pleural effusion and or elevation of the
right hemidiaphragm. There is an irregular, somewhat bandlike
airspace opacity in the right midlung, which is similar in
appearance to prior examination. The left lung is normally aerated.
The heart and mediastinum are unremarkable.
IMPRESSION: Interval removal of a right-sided chest tube. There is a persistent,
small to moderate right pleural effusion and or elevation of the
right hemidiaphragm. There is an irregular, somewhat bandlike
airspace opacity in the right midlung, which is similar in
appearance to prior examination. No new airspace opacity.

## 2021-02-04 ENCOUNTER — Emergency Department (HOSPITAL_COMMUNITY)
Admission: EM | Admit: 2021-02-04 | Discharge: 2021-02-04 | Disposition: A | Discharge status: Home / Self Care | Payer: No Typology Code available for payment source | Attending: Emergency Medicine | Admitting: Emergency Medicine

## 2021-02-04 ENCOUNTER — Encounter (HOSPITAL_COMMUNITY): Payer: Self-pay

## 2021-02-04 DIAGNOSIS — F17210 Nicotine dependence, cigarettes, uncomplicated: Secondary | ICD-10-CM | POA: Insufficient documentation

## 2021-02-04 DIAGNOSIS — Z711 Person with feared health complaint in whom no diagnosis is made: Secondary | ICD-10-CM

## 2021-02-04 DIAGNOSIS — Z202 Contact with and (suspected) exposure to infections with a predominantly sexual mode of transmission: Secondary | ICD-10-CM | POA: Insufficient documentation

## 2021-02-04 MED ORDER — LIDOCAINE HCL 1 % IJ SOLN
INTRAMUSCULAR | Status: AC
  Administered 2021-02-04: 15:00:00 1 mL
  Filled 2021-02-04: qty 20

## 2021-02-04 MED ORDER — CEFTRIAXONE SODIUM 1 G IJ SOLR
500.0000 mg | Freq: Once | INTRAMUSCULAR | Status: AC
  Administered 2021-02-04: 15:00:00 500 mg via INTRAMUSCULAR
  Filled 2021-02-04: qty 10

## 2021-02-04 MED ORDER — DOXYCYCLINE HYCLATE 100 MG PO CAPS: 100.0000 mg | ORAL_CAPSULE | Freq: Two times a day (BID) | ORAL | 0 refills | Status: AC

## 2021-02-04 MED ORDER — DOXYCYCLINE HYCLATE 100 MG PO TABS
100.0000 mg | ORAL_TABLET | Freq: Once | ORAL | Status: AC
  Administered 2021-02-04: 15:00:00 100 mg via ORAL
  Filled 2021-02-04: qty 1

## 2021-02-04 NOTE — Discharge Instructions (Addendum)
We will call you with the results of your STD testing when it is available if it is positive. Take the doxycycline for 1 week. Return to the ER if you start to experience worsening discharge, testicle pain or swelling, fever or rash.
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
## 2021-02-04 NOTE — ED Triage Notes (Signed)
Pt arrived via walk in, states he believes he was exposed to STD. Yellow discharge from penis. Denies any dysuria.

## 2021-02-08 LAB — GC/CHLAMYDIA PROBE AMP (~~LOC~~) NOT AT ARMC
Chlamydia: POSITIVE — AB
Comment: NEGATIVE
Comment: NORMAL
Neisseria Gonorrhea: POSITIVE — AB

## 2021-04-09 ENCOUNTER — Emergency Department (HOSPITAL_COMMUNITY)
Admission: EM | Admit: 2021-04-09 | Discharge: 2021-04-09 | Discharge status: Against Medical Advice | Payer: Self-pay | Attending: Emergency Medicine | Admitting: Emergency Medicine

## 2021-04-09 ENCOUNTER — Other Ambulatory Visit: Payer: Self-pay

## 2021-04-09 DIAGNOSIS — Z20822 Contact with and (suspected) exposure to covid-19: Secondary | ICD-10-CM | POA: Insufficient documentation

## 2021-04-09 DIAGNOSIS — F332 Major depressive disorder, recurrent severe without psychotic features: Secondary | ICD-10-CM | POA: Insufficient documentation

## 2021-04-09 DIAGNOSIS — Z5321 Procedure and treatment not carried out due to patient leaving prior to being seen by health care provider: Secondary | ICD-10-CM | POA: Insufficient documentation

## 2021-04-09 DIAGNOSIS — Y900 Blood alcohol level of less than 20 mg/100 ml: Secondary | ICD-10-CM | POA: Insufficient documentation

## 2021-04-09 DIAGNOSIS — F17210 Nicotine dependence, cigarettes, uncomplicated: Secondary | ICD-10-CM | POA: Insufficient documentation

## 2021-04-09 DIAGNOSIS — F1123 Opioid dependence with withdrawal: Secondary | ICD-10-CM | POA: Insufficient documentation

## 2021-04-09 DIAGNOSIS — R45851 Suicidal ideations: Secondary | ICD-10-CM | POA: Insufficient documentation

## 2021-04-09 LAB — URINALYSIS, ROUTINE W REFLEX MICROSCOPIC
Bilirubin Urine: NEGATIVE
Glucose, UA: NEGATIVE mg/dL
Hgb urine dipstick: NEGATIVE
Ketones, ur: NEGATIVE mg/dL
Nitrite: NEGATIVE
Protein, ur: NEGATIVE mg/dL
Specific Gravity, Urine: <1.005 — ABNORMAL LOW (ref 1.005–1.030)
pH: 6 (ref 5.0–8.0)

## 2021-04-09 LAB — CBC
HCT: 47.3 % (ref 39.0–52.0)
Hemoglobin: 16 g/dL (ref 13.0–17.0)
MCH: 31.4 pg (ref 26.0–34.0)
MCHC: 33.8 g/dL (ref 30.0–36.0)
MCV: 92.9 fL (ref 80.0–100.0)
Platelets: 256 10*3/uL (ref 150–400)
RBC: 5.09 MIL/uL (ref 4.22–5.81)
RDW: 13 % (ref 11.5–15.5)
WBC: 7.4 10*3/uL (ref 4.0–10.5)
nRBC: 0 % (ref 0.0–0.2)

## 2021-04-09 LAB — RESP PANEL BY RT-PCR (FLU A&B, COVID) ARPGX2
Influenza A by PCR: NEGATIVE
Influenza B by PCR: NEGATIVE
SARS Coronavirus 2 by RT PCR: NEGATIVE

## 2021-04-09 LAB — DIFFERENTIAL
Abs Immature Granulocytes: 0.02 10*3/uL (ref 0.00–0.07)
Basophils Absolute: 0.1 10*3/uL (ref 0.0–0.1)
Basophils Relative: 1 %
Eosinophils Absolute: 0.3 10*3/uL (ref 0.0–0.5)
Eosinophils Relative: 5 %
Immature Granulocytes: 0 %
Lymphocytes Relative: 34 %
Lymphs Abs: 2.5 10*3/uL (ref 0.7–4.0)
Monocytes Absolute: 0.9 10*3/uL (ref 0.1–1.0)
Monocytes Relative: 12 %
Neutro Abs: 3.6 10*3/uL (ref 1.7–7.7)
Neutrophils Relative %: 48 %

## 2021-04-09 LAB — COMPREHENSIVE METABOLIC PANEL
ALT: 14 U/L (ref 0–44)
AST: 22 U/L (ref 15–41)
Albumin: 4.2 g/dL (ref 3.5–5.0)
Alkaline Phosphatase: 70 U/L (ref 38–126)
Anion gap: 6 (ref 5–15)
BUN: 10 mg/dL (ref 6–20)
CO2: 30 mmol/L (ref 22–32)
Calcium: 9.5 mg/dL (ref 8.9–10.3)
Chloride: 105 mmol/L (ref 98–111)
Creatinine, Ser: 0.94 mg/dL (ref 0.61–1.24)
GFR, Estimated: >60 mL/min (ref >60)
Glucose, Bld: 65 mg/dL — ABNORMAL LOW (ref 70–99)
Potassium: 3.6 mmol/L (ref 3.5–5.1)
Sodium: 141 mmol/L (ref 135–145)
Total Bilirubin: 0.9 mg/dL (ref 0.3–1.2)
Total Protein: 7.5 g/dL (ref 6.5–8.1)

## 2021-04-09 LAB — SALICYLATE LEVEL: Salicylate Lvl: <7 mg/dL — ABNORMAL LOW (ref 7.0–30.0)

## 2021-04-09 LAB — RAPID URINE DRUG SCREEN, HOSP PERFORMED
Amphetamines: POSITIVE — AB
Barbiturates: NOT DETECTED
Benzodiazepines: NOT DETECTED
Cocaine: NOT DETECTED
Opiates: NOT DETECTED
Tetrahydrocannabinol: POSITIVE — AB

## 2021-04-09 LAB — ETHANOL: Alcohol, Ethyl (B): <10 mg/dL (ref <10)

## 2021-04-09 LAB — ACETAMINOPHEN LEVEL: Acetaminophen (Tylenol), Serum: <10 ug/mL — ABNORMAL LOW (ref 10–30)

## 2021-04-09 MED ORDER — ONDANSETRON HCL 4 MG PO TABS: 4.0000 mg | ORAL_TABLET | Freq: Three times a day (TID) | ORAL | Status: DC | PRN

## 2021-04-09 MED ORDER — THIAMINE HCL 100 MG PO TABS: 100.0000 mg | ORAL_TABLET | Freq: Every day | ORAL | Status: DC

## 2021-04-09 MED ORDER — LORAZEPAM 1 MG PO TABS: 0.0000 mg | ORAL_TABLET | Freq: Two times a day (BID) | ORAL | Status: DC

## 2021-04-09 MED ORDER — LORAZEPAM 2 MG/ML IJ SOLN: 0.0000 mg | Freq: Two times a day (BID) | INTRAMUSCULAR | Status: DC

## 2021-04-09 MED ORDER — ZOLPIDEM TARTRATE 5 MG PO TABS
5.0000 mg | ORAL_TABLET | Freq: Every evening | ORAL | Status: DC | PRN
Start: 2021-04-09 — End: 2021-04-09

## 2021-04-09 MED ORDER — IBUPROFEN 200 MG PO TABS
600.0000 mg | ORAL_TABLET | Freq: Three times a day (TID) | ORAL | Status: DC | PRN
Start: 2021-04-09 — End: 2021-04-09

## 2021-04-09 MED ORDER — LORAZEPAM 2 MG/ML IJ SOLN: 0.0000 mg | Freq: Four times a day (QID) | INTRAMUSCULAR | Status: DC

## 2021-04-09 MED ORDER — THIAMINE HCL 100 MG/ML IJ SOLN: 100.0000 mg | Freq: Every day | INTRAMUSCULAR | Status: DC

## 2021-04-09 MED ORDER — LORAZEPAM 1 MG PO TABS: 0.0000 mg | ORAL_TABLET | Freq: Four times a day (QID) | ORAL | Status: DC
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
## 2021-04-09 NOTE — ED Notes (Signed)
Attempted to introduce myself to patient. Pt sleepy and irritable while trying to do assessment. Pt requests sleep at this time and to reassess later.

## 2021-04-09 NOTE — ED Notes (Signed)
PA Thamas Jaegers. Notified of pt's intent to leave AMA. Pt signed AMA form with me as witness.

## 2021-04-09 NOTE — Progress Notes (Signed)
Per Tamela Oddi, patient meets criteria for inpatient treatment. There are no available or appropriate beds at [facility] today. CSW faxed referrals to the following facilities for review:  [facility]  Pending - No Request Sent N/A [address] [place] [place] [address] [phone] --  [facility]  Pending - No Request Sent N/A [address], [place] [address] [phone] --  [facility]  Pending - No Request Sent N/A [address], [place] [address] [phone] --  [facility]  Pending - No Request Sent N/A [address] [place] [address] [phone] --  [facility]  Pending - No Request Sent N/A [address] [place] [address] [phone] --  [facility]  Pending - No Request Sent N/A [address] [place], [place] [address] [phone] --  [facility]  Pending - No Request Sent N/A [address] [place] [address] [phone] --  [facility]  Pending - No Request Sent N/A [address], [place] [address] [phone] --  [facility]  Pending - No Request Sent N/A [address] [place] [address] [phone] --  [facility]  Pending - No Request Sent N/A [address] [place] [address] [phone] --  [facility]  Pending - No Request Sent N/A [address] [place] [address] [phone] --  [facility]  Pending - No Request Sent N/A [address] [place] [address] [phone] --  [facility]  Pending - No Request Sent N/A [address] [place] [address] [phone] --  [facility]  Pending - No Request Sent N/A [name] [place] [address] [phone] --  [facility]  Pending - No Request Sent N/A [address], [place] [address] [phone] --  [facility]  Pending - No Request Sent N/A [address], [place] [address] [phone] --  [facility]  Pending - No Request Sent N/A [address] [place] [address] [phone] --  [facility]  Pending - No Request Sent N/A [address] [place] [address] [phone] --  [facility]  Pending - No Request Sent N/A [address], [place] [address] [phone] --  [facility]  Pending - No Request Sent N/A [address] [place] [address] [phone] --   TTS will continue to seek bed placement.  Crissie Reese, MSW, LCSW-A, LCAS-A Phone: [phone] Disposition/TOC

## 2021-04-09 NOTE — ED Notes (Signed)
Pt asking to speak with psych to be discharged. Psych provider notified.

## 2021-04-09 NOTE — ED Notes (Signed)
Patient changed out into burgandy scrubs. Patient has 2 patient belonging bags located across from room 18. Patient has a patient valuables envelope with his phone and 70$ cash that is locked up with security.
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
## 2021-04-09 NOTE — ED Notes (Signed)
Pt becoming agitated. Asked for his belongings to leave. Pt is noted to be VOLUNTARY at this time. Pt provided with belongings.

## 2021-04-15 ENCOUNTER — Encounter (HOSPITAL_COMMUNITY): Payer: Self-pay

## 2021-04-15 ENCOUNTER — Emergency Department (HOSPITAL_COMMUNITY)
Admission: EM | Admit: 2021-04-15 | Discharge: 2021-04-15 | Disposition: A | Discharge status: Home / Self Care | Payer: Self-pay | Attending: Emergency Medicine | Admitting: Emergency Medicine

## 2021-04-15 ENCOUNTER — Emergency Department (HOSPITAL_COMMUNITY): Payer: Self-pay

## 2021-04-15 ENCOUNTER — Other Ambulatory Visit: Payer: Self-pay

## 2021-04-15 DIAGNOSIS — R918 Other nonspecific abnormal finding of lung field: Secondary | ICD-10-CM | POA: Insufficient documentation

## 2021-04-15 DIAGNOSIS — Z20822 Contact with and (suspected) exposure to covid-19: Secondary | ICD-10-CM | POA: Insufficient documentation

## 2021-04-15 DIAGNOSIS — R600 Localized edema: Secondary | ICD-10-CM | POA: Insufficient documentation

## 2021-04-15 DIAGNOSIS — Y900 Blood alcohol level of less than 20 mg/100 ml: Secondary | ICD-10-CM | POA: Insufficient documentation

## 2021-04-15 DIAGNOSIS — T1490XA Injury, unspecified, initial encounter: Secondary | ICD-10-CM

## 2021-04-15 DIAGNOSIS — Y92410 Unspecified street and highway as the place of occurrence of the external cause: Secondary | ICD-10-CM | POA: Insufficient documentation

## 2021-04-15 DIAGNOSIS — T07XXXA Unspecified multiple injuries, initial encounter: Secondary | ICD-10-CM

## 2021-04-15 DIAGNOSIS — Z23 Encounter for immunization: Secondary | ICD-10-CM | POA: Insufficient documentation

## 2021-04-15 DIAGNOSIS — S0181XA Laceration without foreign body of other part of head, initial encounter: Secondary | ICD-10-CM | POA: Insufficient documentation

## 2021-04-15 DIAGNOSIS — S60511A Abrasion of right hand, initial encounter: Secondary | ICD-10-CM | POA: Insufficient documentation

## 2021-04-15 DIAGNOSIS — S90811A Abrasion, right foot, initial encounter: Secondary | ICD-10-CM | POA: Insufficient documentation

## 2021-04-15 DIAGNOSIS — S50311A Abrasion of right elbow, initial encounter: Secondary | ICD-10-CM | POA: Insufficient documentation

## 2021-04-15 DIAGNOSIS — S60512A Abrasion of left hand, initial encounter: Secondary | ICD-10-CM | POA: Insufficient documentation

## 2021-04-15 DIAGNOSIS — R0902 Hypoxemia: Secondary | ICD-10-CM | POA: Insufficient documentation

## 2021-04-15 DIAGNOSIS — T50901A Poisoning by unspecified drugs, medicaments and biological substances, accidental (unintentional), initial encounter: Secondary | ICD-10-CM

## 2021-04-15 LAB — RESP PANEL BY RT-PCR (FLU A&B, COVID) ARPGX2
Influenza A by PCR: NEGATIVE
Influenza B by PCR: NEGATIVE
SARS Coronavirus 2 by RT PCR: NEGATIVE

## 2021-04-15 LAB — LACTIC ACID, PLASMA
Lactic Acid, Venous: 10.7 mmol/L (ref 0.5–1.9)
Lactic Acid, Venous: 1.4 mmol/L (ref 0.5–1.9)

## 2021-04-15 LAB — URINALYSIS, ROUTINE W REFLEX MICROSCOPIC
Bilirubin Urine: NEGATIVE
Glucose, UA: NEGATIVE mg/dL
Ketones, ur: NEGATIVE mg/dL
Leukocytes,Ua: NEGATIVE
Nitrite: NEGATIVE
Protein, ur: 100 mg/dL — AB
Specific Gravity, Urine: >1.03 — ABNORMAL HIGH (ref 1.005–1.030)
pH: 6 (ref 5.0–8.0)

## 2021-04-15 LAB — URINALYSIS, MICROSCOPIC (REFLEX)

## 2021-04-15 LAB — CBC
HCT: 47.8 % (ref 39.0–52.0)
Hemoglobin: 16.1 g/dL (ref 13.0–17.0)
MCH: 31.4 pg (ref 26.0–34.0)
MCHC: 33.7 g/dL (ref 30.0–36.0)
MCV: 93.2 fL (ref 80.0–100.0)
Platelets: 283 10*3/uL (ref 150–400)
RBC: 5.13 MIL/uL (ref 4.22–5.81)
RDW: 12.7 % (ref 11.5–15.5)
WBC: 8.2 10*3/uL (ref 4.0–10.5)
nRBC: 0 % (ref 0.0–0.2)

## 2021-04-15 LAB — I-STAT CHEM 8, ED
BUN: 16 mg/dL (ref 6–20)
Calcium, Ion: 1.12 mmol/L — ABNORMAL LOW (ref 1.15–1.40)
Chloride: 109 mmol/L (ref 98–111)
Creatinine, Ser: 1.5 mg/dL — ABNORMAL HIGH (ref 0.61–1.24)
Glucose, Bld: 110 mg/dL — ABNORMAL HIGH (ref 70–99)
HCT: 47 % (ref 39.0–52.0)
Hemoglobin: 16 g/dL (ref 13.0–17.0)
Potassium: 4.3 mmol/L (ref 3.5–5.1)
Sodium: 140 mmol/L (ref 135–145)
TCO2: 22 mmol/L (ref 22–32)

## 2021-04-15 LAB — COMPREHENSIVE METABOLIC PANEL
ALT: 30 U/L (ref 0–44)
AST: 69 U/L — ABNORMAL HIGH (ref 15–41)
Albumin: 4 g/dL (ref 3.5–5.0)
Alkaline Phosphatase: 71 U/L (ref 38–126)
Anion gap: 18 — ABNORMAL HIGH (ref 5–15)
BUN: 16 mg/dL (ref 6–20)
CO2: 17 mmol/L — ABNORMAL LOW (ref 22–32)
Calcium: 9.4 mg/dL (ref 8.9–10.3)
Chloride: 103 mmol/L (ref 98–111)
Creatinine, Ser: 1.52 mg/dL — ABNORMAL HIGH (ref 0.61–1.24)
GFR, Estimated: >60 mL/min (ref >60)
Glucose, Bld: 111 mg/dL — ABNORMAL HIGH (ref 70–99)
Potassium: 4.6 mmol/L (ref 3.5–5.1)
Sodium: 138 mmol/L (ref 135–145)
Total Bilirubin: 1.4 mg/dL — ABNORMAL HIGH (ref 0.3–1.2)
Total Protein: 6.7 g/dL (ref 6.5–8.1)

## 2021-04-15 LAB — PROTIME-INR
INR: 1.1 (ref 0.8–1.2)
Prothrombin Time: 14.1 seconds (ref 11.4–15.2)

## 2021-04-15 LAB — SAMPLE TO BLOOD BANK

## 2021-04-15 LAB — ETHANOL: Alcohol, Ethyl (B): <10 mg/dL (ref <10)

## 2021-04-15 MED ORDER — TETANUS-DIPHTH-ACELL PERTUSSIS 5-2.5-18.5 LF-MCG/0.5 IM SUSY
0.5000 mL | PREFILLED_SYRINGE | Freq: Once | INTRAMUSCULAR | Status: AC
  Administered 2021-04-15: 0.5 mL via INTRAMUSCULAR
  Filled 2021-04-15: qty 0.5

## 2021-04-15 MED ORDER — NALOXONE HCL 0.4 MG/ML IJ SOLN
0.2000 mg | Freq: Once | INTRAMUSCULAR | Status: AC
  Administered 2021-04-15: 0.2 mg via INTRAVENOUS
  Filled 2021-04-15: qty 1

## 2021-04-15 MED ORDER — LACTATED RINGERS IV BOLUS
2000.0000 mL | Freq: Once | INTRAVENOUS | Status: AC
  Administered 2021-04-15: 2000 mL via INTRAVENOUS

## 2021-04-15 MED ORDER — ONDANSETRON HCL 4 MG/2ML IJ SOLN
INTRAMUSCULAR | Status: AC
  Administered 2021-04-15: 4 mg
  Filled 2021-04-15: qty 2

## 2021-04-15 MED ORDER — IOHEXOL 350 MG/ML SOLN
80.0000 mL | Freq: Once | INTRAVENOUS | Status: AC | PRN
  Administered 2021-04-15: 80 mL via INTRAVENOUS

## 2021-04-15 MED ORDER — ONDANSETRON HCL 4 MG/2ML IJ SOLN: 4.0000 mg | Freq: Once | INTRAMUSCULAR | Status: AC

## 2021-04-15 NOTE — ED Notes (Signed)
Patient states he was missing his shoes and cell phone, items didn't come in with patient

## 2021-04-15 NOTE — Discharge Instructions (Addendum)
Call your primary care doctor or specialist as discussed in the next 2-3 days.   Return immediately back to the ER if:  Your symptoms worsen within the next 12-24 hours. You develop new symptoms such as new fevers, persistent vomiting, new pain, shortness of breath, or new weakness or numbness, or if you have any other concerns.  
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
## 2021-04-15 NOTE — ED Notes (Signed)
Pt responds to narcan, states that he took to many percocet's today that he gets on the streets for his pain, reports he took possibly 4-5
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
## 2022-09-18 ENCOUNTER — Telehealth (INDEPENDENT_AMBULATORY_CARE_PROVIDER_SITE_OTHER): Payer: Self-pay

## 2022-09-18 NOTE — Transitions of Care (Post Inpatient/ED Visit) (Unsigned)
   09/18/2022  Name: Daniel [name] MRN: [id number] DOB: March 09, 1991  Today's TOC FU Call Status: Today's TOC FU Call Status:: Unsuccessul Call (1st Attempt) Unsuccessful Call (1st Attempt) Date: 09/18/22  Attempted to reach the patient regarding the most recent Inpatient/ED visit.  Follow Up Plan: Additional outreach attempts will be made to reach the patient to complete the Transitions of Care (Post Inpatient/ED visit) call.   Kendrick LPN [place] Advisor Direct Dial [phone]

## 2022-09-19 NOTE — Transitions of Care (Post Inpatient/ED Visit) (Signed)
   09/19/2022  Name: Daniel [name] MRN: [id number] DOB: [birth year]  Today's TOC FU Call Status: Today's TOC FU Call Status:: Unsuccessful Call (2nd Attempt) Unsuccessful Call (1st Attempt) Date: 09/18/22 Unsuccessful Call (2nd Attempt) Date: 09/19/22  Attempted to reach the patient regarding the most recent Inpatient/ED visit.  Follow Up Plan: No further outreach attempts will be made at this time. We have been unable to contact the patient.  [place]on LPN [place] Advisor Direct Dial [phone]

## 2022-10-23 ENCOUNTER — Other Ambulatory Visit: Payer: Self-pay

## 2022-10-23 ENCOUNTER — Emergency Department (HOSPITAL_COMMUNITY)
Admission: EM | Admit: 2022-10-23 | Discharge: 2022-10-26 | Disposition: A | Discharge status: Home / Self Care | Payer: Self-pay | Attending: Emergency Medicine | Admitting: Emergency Medicine

## 2022-10-23 DIAGNOSIS — R456 Violent behavior: Secondary | ICD-10-CM | POA: Insufficient documentation

## 2022-10-23 DIAGNOSIS — F1510 Other stimulant abuse, uncomplicated: Secondary | ICD-10-CM | POA: Insufficient documentation

## 2022-10-23 DIAGNOSIS — F1590 Other stimulant use, unspecified, uncomplicated: Secondary | ICD-10-CM | POA: Insufficient documentation

## 2022-10-23 DIAGNOSIS — R45851 Suicidal ideations: Secondary | ICD-10-CM

## 2022-10-23 DIAGNOSIS — F1994 Other psychoactive substance use, unspecified with psychoactive substance-induced mood disorder: Secondary | ICD-10-CM | POA: Insufficient documentation

## 2022-10-23 DIAGNOSIS — F1924 Other psychoactive substance dependence with psychoactive substance-induced mood disorder: Secondary | ICD-10-CM | POA: Insufficient documentation

## 2022-10-23 DIAGNOSIS — Z20822 Contact with and (suspected) exposure to covid-19: Secondary | ICD-10-CM | POA: Insufficient documentation

## 2022-10-23 LAB — COMPREHENSIVE METABOLIC PANEL
ALT: 11 U/L (ref 0–44)
AST: 17 U/L (ref 15–41)
Albumin: 3.6 g/dL (ref 3.5–5.0)
Alkaline Phosphatase: 72 U/L (ref 38–126)
Anion gap: 10 (ref 5–15)
BUN: 6 mg/dL (ref 6–20)
CO2: 30 mmol/L (ref 22–32)
Calcium: 9 mg/dL (ref 8.9–10.3)
Chloride: 101 mmol/L (ref 98–111)
Creatinine, Ser: 1.06 mg/dL (ref 0.61–1.24)
GFR, Estimated: >60 mL/min (ref >60)
Glucose, Bld: 87 mg/dL (ref 70–99)
Potassium: 4.2 mmol/L (ref 3.5–5.1)
Sodium: 141 mmol/L (ref 135–145)
Total Bilirubin: 0.4 mg/dL (ref 0.3–1.2)
Total Protein: 6.7 g/dL (ref 6.5–8.1)

## 2022-10-23 LAB — CBC WITH DIFFERENTIAL/PLATELET
Abs Immature Granulocytes: 0.01 10*3/uL (ref 0.00–0.07)
Basophils Absolute: 0.1 10*3/uL (ref 0.0–0.1)
Basophils Relative: 1 %
Eosinophils Absolute: 0.3 10*3/uL (ref 0.0–0.5)
Eosinophils Relative: 5 %
HCT: 47.4 % (ref 39.0–52.0)
Hemoglobin: 15.9 g/dL (ref 13.0–17.0)
Immature Granulocytes: 0 %
Lymphocytes Relative: 30 %
Lymphs Abs: 1.9 10*3/uL (ref 0.7–4.0)
MCH: 31.1 pg (ref 26.0–34.0)
MCHC: 33.5 g/dL (ref 30.0–36.0)
MCV: 92.6 fL (ref 80.0–100.0)
Monocytes Absolute: 1 10*3/uL (ref 0.1–1.0)
Monocytes Relative: 15 %
Neutro Abs: 3 10*3/uL (ref 1.7–7.7)
Neutrophils Relative %: 49 %
Platelets: 248 10*3/uL (ref 150–400)
RBC: 5.12 MIL/uL (ref 4.22–5.81)
RDW: 12.9 % (ref 11.5–15.5)
WBC: 6.2 10*3/uL (ref 4.0–10.5)
nRBC: 0 % (ref 0.0–0.2)

## 2022-10-23 LAB — SALICYLATE LEVEL: Salicylate Lvl: <7 mg/dL — ABNORMAL LOW (ref 7.0–30.0)

## 2022-10-23 LAB — RAPID URINE DRUG SCREEN, HOSP PERFORMED
Amphetamines: POSITIVE — AB
Barbiturates: NOT DETECTED
Benzodiazepines: NOT DETECTED
Cocaine: NOT DETECTED
Opiates: NOT DETECTED
Tetrahydrocannabinol: POSITIVE — AB

## 2022-10-23 LAB — ETHANOL: Alcohol, Ethyl (B): <10 mg/dL (ref <10)

## 2022-10-23 LAB — ACETAMINOPHEN LEVEL: Acetaminophen (Tylenol), Serum: <10 ug/mL — ABNORMAL LOW (ref 10–30)

## 2022-10-23 NOTE — ED Provider Notes (Signed)
[facility] Provider Note   CSN: [id number] Arrival date & time: 10/23/22  1929     History Chief Complaint  Patient presents with   Suicidal     Daniel [name] is a 32 y.o. male.  Patient with past history significant for bipolar disorder, major depressive disorder, PTSD presents emergency department for suicidal ideation. Patient reports that he has been having thoughts of overdosing on medications or meth in an attempt to end his life, but denies any recent attempt to end his life. Denies any auditory or visual hallucinations. Patient is voluntary and agreeable with potential inpatient psychiatric admission as he feels that he is not stable to discharge home safely.  HPI     Home Medications Prior to Admission medications   Medication Sig Start Date End Date Taking? Authorizing Provider  cholecalciferol (VITAMIN D3) 10 MCG (400 UNIT) TABS tablet Take 1 tablet (400 Units total) by mouth daily. Patient not taking: Reported on 06/03/2020 05/09/20   Connye Burkitt, NP  DULoxetine (CYMBALTA) 60 MG capsule Take 1 capsule (60 mg total) by mouth daily. 06/04/20   Lucky Rathke, FNP  gabapentin (NEURONTIN) 400 MG capsule Take 1 capsule (400 mg total) by mouth 3 (three) times daily. 06/03/20   Lucky Rathke, FNP  hydrOXYzine (ATARAX/VISTARIL) 25 MG tablet Take 1 tablet (25 mg total) by mouth 3 (three) times daily as needed for itching or anxiety. 05/25/20   Money, Lowry Ram, FNP  OXcarbazepine (TRILEPTAL) 150 MG tablet Take 1 tablet (150 mg total) by mouth 2 (two) times daily. 06/03/20   Lucky Rathke, FNP  QUEtiapine (SEROQUEL XR) 300 MG 24 hr tablet Take 1 tablet (300 mg total) by mouth at bedtime. 06/03/20   Lucky Rathke, FNP      Allergies    Tylenol [acetaminophen]    Review of Systems   Review of Systems  Psychiatric/Behavioral:  Positive for suicidal ideas.   All other systems reviewed and are negative.   Physical Exam Updated  Vital Signs BP (!) 140/90 (BP Location: Right Arm)   Pulse 89   Temp 98.9 F (37.2 C) (Oral)   Resp 18   SpO2 98%  Physical Exam Vitals and nursing note reviewed.  Constitutional:      General: He is not in acute distress.    Appearance: Normal appearance. He is well-developed.  HENT:     Head: Normocephalic and atraumatic.  Eyes:     Conjunctiva/sclera: Conjunctivae normal.  Cardiovascular:     Rate and Rhythm: Normal rate and regular rhythm.     Heart sounds: No murmur heard. Pulmonary:     Effort: Pulmonary effort is normal. No respiratory distress.     Breath sounds: Normal breath sounds.  Abdominal:     Palpations: Abdomen is soft.     Tenderness: There is no abdominal tenderness.  Musculoskeletal:        General: No swelling.     Cervical back: Neck supple.  Skin:    General: Skin is warm and dry.     Capillary Refill: Capillary refill takes less than 2 seconds.  Neurological:     Mental Status: He is alert.  Psychiatric:        Mood and Affect: Mood normal.     ED Results / Procedures / Treatments   Labs (all labs ordered are listed, but only abnormal results are displayed) Labs Reviewed  RAPID URINE DRUG SCREEN, HOSP PERFORMED - Abnormal; Notable  for the following components:      Result Value   Amphetamines POSITIVE (*)    Tetrahydrocannabinol POSITIVE (*)    All other components within normal limits  ACETAMINOPHEN LEVEL - Abnormal; Notable for the following components:   Acetaminophen (Tylenol), Serum <10 (*)    All other components within normal limits  SALICYLATE LEVEL - Abnormal; Notable for the following components:   Salicylate Lvl Q000111Q (*)    All other components within normal limits  COMPREHENSIVE METABOLIC PANEL  ETHANOL  CBC WITH DIFFERENTIAL/PLATELET    EKG None  Radiology No results found.  Procedures Procedures   Medications Ordered in ED Medications - No data to display  ED Course/ Medical Decision Making/ A&P                            Medical Decision Making  This patient presents to the ED for concern of suicidal ideation. Differential diagnosis includes depression, bipolar disorder, psychosis   Lab Tests:  I Ordered, and personally interpreted labs.  The pertinent results include:  Normal CBC and CMP, UDS positive for marijuana and amphetamines, negative ethanol, salicylate, and acetaminophen levels   Medicines ordered and prescription drug management:  Patient reported to me that he takes Cymbalta 9mg , Seroquel 200mg , and Buspar 20mg . Given that his does not match his current medication list on his chart, I do not think that it would be reasonable to place standing orders for these home medications given that he has also been off of these medications for over a month now.  Problem List / ED Course:  Patient presented to the ED for suicidal ideation. Patient has previously been admitted for MDD and suicidal ideation at [facility] on 09/12/2022 and 06/01/2022. Patient feels that he is currently unstable and unsafe to discharge home as he has limited support and feels that he would like act on his intentions if discharged. Patient not currently experiencing auditory or visual hallucinations, homicidal ideation, or acute anxiety or panic. Workup performed which is largely reassuring with normal CBC and CMP, but notable amphetamines and tetrahydrocannabinol noted on UDS. Will have patient evaluated with TTS consult for potential admission for suicidal ideation. Patient agrees with this plan. Patient is medically cleared at this time.  Final Clinical Impression(s) / ED Diagnoses Final diagnoses:  Suicidal ideation    Rx / DC Orders ED Discharge Orders     None         Luvenia Heller, PA-C 10/23/22 2329    Fredia Sorrow, MD 10/24/22 1352

## 2022-10-23 NOTE — ED Notes (Signed)
PA-C Lourdes Sledge at the bedside to speak with pt

## 2022-10-23 NOTE — ED Provider Triage Note (Signed)
Emergency Medicine Provider Triage Evaluation Note  Daniel [name] , a 32 y.o. male  was evaluated in triage.  Pt complains of SI.  Patient states he has thoughts of hurting himself and nonspecific was.  Patient states he is also on IV meth user and last used yesterday at noon but wants to clean up due to having a kid.  Patient denied any pills, cutting behavior, alcohol, recent marijuana use or other drug use.  Patient denied thoughts of hurting others.  Review of Systems  Positive: See HPI Negative: See HPI  Physical Exam  BP (!) 135/101   Pulse 87   Temp 98.3 F (36.8 C) (Oral)   Resp 16   SpO2 98%  Gen:   Awake, no distress   Resp:  Normal effort  MSK:   Moves extremities without difficulty  Other:  Has active suicidal thoughts  Medical Decision Making  Medically screening exam initiated at 8:16 PM.  Appropriate orders placed.  Daniel [name] was informed that the remainder of the evaluation will be completed by another provider, this initial triage assessment does not replace that evaluation, and the importance of remaining in the ED until their evaluation is complete.  Workup initiated, medical clearance labs ordered and patient will need IVC due to current SI   Chuck Hint, Hershal Coria 10/23/22 2017

## 2022-10-23 NOTE — ED Triage Notes (Signed)
Patient reports suicidal ideation plans to overdose on drugs , also requesting detox for his meth addiction , no hallucinations .

## 2022-10-23 NOTE — ED Notes (Signed)
Pt arrived to [facility] bed 22 at this time via tech, NAD noted, pt alert and oriented

## 2022-10-24 ENCOUNTER — Encounter (HOSPITAL_COMMUNITY): Payer: Self-pay

## 2022-10-24 DIAGNOSIS — R45851 Suicidal ideations: Secondary | ICD-10-CM

## 2022-10-24 LAB — RESP PANEL BY RT-PCR (RSV, FLU A&B, COVID)  RVPGX2
Influenza A by PCR: NEGATIVE
Influenza B by PCR: NEGATIVE
Resp Syncytial Virus by PCR: POSITIVE — AB
SARS Coronavirus 2 by RT PCR: NEGATIVE

## 2022-10-24 MED ORDER — DULOXETINE HCL 30 MG PO CPEP
30.0000 mg | ORAL_CAPSULE | Freq: Every day | ORAL | Status: DC
  Administered 2022-10-24 – 2022-10-26 (×3): 30 mg via ORAL
  Filled 2022-10-24 (×3): qty 1

## 2022-10-24 MED ORDER — HYDROXYZINE HCL 25 MG PO TABS: 25.0000 mg | ORAL_TABLET | Freq: Three times a day (TID) | ORAL | Status: DC | PRN

## 2022-10-24 MED ORDER — IBUPROFEN 400 MG PO TABS
600.0000 mg | ORAL_TABLET | Freq: Four times a day (QID) | ORAL | Status: DC | PRN
  Administered 2022-10-24: 600 mg via ORAL
  Filled 2022-10-24: qty 1

## 2022-10-24 MED ORDER — ZIPRASIDONE MESYLATE 20 MG IM SOLR: 20.0000 mg | Freq: Two times a day (BID) | INTRAMUSCULAR | Status: DC | PRN

## 2022-10-24 MED ORDER — QUETIAPINE FUMARATE 200 MG PO TABS
200.0000 mg | ORAL_TABLET | Freq: Every day | ORAL | Status: DC
  Administered 2022-10-24 – 2022-10-25 (×2): 200 mg via ORAL
  Filled 2022-10-24 (×2): qty 1

## 2022-10-24 NOTE — Consult Note (Signed)
  Patient covid panel resulted in Positive RSV status. He no longer can be transferred to [facility] due to positive results.   Spoke with patient about treatment options. He is still endorsing SI and unable to contract for safety at this time. Will recommend IP treatment for now, and assess his need for IP tx daily. Can consider discharge prior to IP treatment if mood significantly improves prior to inpatient admission, considering isolation times.

## 2022-10-24 NOTE — ED Provider Notes (Signed)
  Physical Exam  BP (!) 130/95 (BP Location: Left Arm)   Pulse 65   Temp 98.3 F (36.8 C) (Oral)   Resp 12   SpO2 100%   Physical Exam  Procedures  Procedures  ED Course / MDM    Medical Decision Making  Accepted at behavioral health urgent care for inpatient treatment.  Reevaluated prior to transfer.       Davonna Belling, MD 10/24/22 1039
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
## 2022-10-24 NOTE — Progress Notes (Addendum)
Pt was accepted to [facility]; Address: [address], [place], [place] [address] TODAY 10/24/22 PENDING Negative COVID  Pt meets inpatient criteria per Ricky Ala, NP  Accepting [facility] Provider: Dr. Corky Sox, MD Attending Physician will be Dr. Ashok Pall  Report can be called to: [phone]  Pt can arrive after PENDING Items  Care Team notified: Ricky Ala, NP, Hillis Range, RN, Marinda Elk, RN, Astrid Divine, Dr. Corky Sox, MD  Nadara Mode, [place] 10/24/2022 @ 10:47 AM

## 2022-10-25 NOTE — ED Provider Notes (Addendum)
Emergency Medicine Observation Re-evaluation Note  Daniel [name] is a 32 y.o. male, seen on rounds today.  Pt initially presented to the ED for complaints of Suicidal  Currently, the patient is awaiting inpatient placement.  Patient reports he has had a mild cough for about 2 weeks.  No nasal congestion or fevers.  No chest pain or shortness of breath.  Physical Exam  BP 126/84 (BP Location: Right Arm)   Pulse 85   Temp 98 F (36.7 C) (Oral)   Resp 16   SpO2 99%  Physical Exam General: Resting quietly no distress.  Alert. Cardiac: Regular no rub murmur gallop Lungs: Clear to auscultation Psych: Affect is flat.  He is cooperative.  ED Course / MDM  EKG:EKG Interpretation  Date/Time:  Monday October 23 2022 20:36:58 EDT Ventricular Rate:  89 PR Interval:  158 QRS Duration: 86 QT Interval:  316 QTC Calculation: 384 R Axis:   80 Text Interpretation: Normal sinus rhythm Septal infarct , age undetermined Abnormal ECG When compared with ECG of 09-Apr-2021 07:38, PREVIOUS ECG IS PRESENT No sig changes Confirmed by Octaviano Glow [id number]) on 10/24/2022 10:09:29 AM  I have reviewed the labs performed to date as well as medications administered while in observation.  Recent changes in the last 24 hours include patient tested positive for RSV and now was refused for inpatient placement.  Plan  Current plan is for inpatient psychiatric care..  Patient tested positive for RSV and his admission placement was declined.  Patient is minimally symptomatic if at all.  He reports some cough but has not had chest pain or shortness of breath.  On exam he has normal exam and normal vital signs.  Will try to contact social work regarding the criteria around admission with a positive RSV test.  Informed patient will need a BICU bed b/c of RSV    Charlesetta Shanks, MD 10/25/22 [place], [place], MD 10/25/22 1549

## 2022-10-25 NOTE — ED Notes (Signed)
Sitter attempted to get vital signs on this patient and the patient refused to allow [place] to check his vital signs.

## 2022-10-25 NOTE — Progress Notes (Signed)
Inpatient Behavioral Health Placement  Pt meets inpatient criteria per Merlyn Lot, NP. There are no available beds within the [facility] system per [facility] Lynnda Shields, RN. Pt has tested positive for RSV.  Referral was sent to the following facilities;   Destination  Service Provider Address Phone Fax  [place] [address] [place]  [facility]  [address] [place] [address] [phone]  [place]  [address], [place] [phone]  [place] [facility]  [address] [place], [place] [address] [place]  [facility] The[place] [address] [place]  [facility]  [address] [place] [address] [phone]  [facility]  [place] [place] [address] [phone] [place]  [place], [place] [address] [phone]  [facility] [place] [facility] [place]  [address], [place] [address] [phone] [place] [address], [place] [address] [place]  [facility]  [address], [place] [address] [phone]  [facility]  [place], [place] [address] [place]  [facility]  [address], [place] [address] [phone]  [place]  [address] [place] [address] [phone]      [facility]  [address] [place] [address] [phone] [facility]  [address] [place] [address] [phone] [facility]  [address]  [place] [address] [phone] [place]  [address] [place] [address] [phone]  [facility]  [address], [place] [address] [phone]    Situation ongoing,  CSW will follow up.   Benjaman Kindler, MSW, [facility] 10/25/2022  @ 9:50 PM

## 2022-10-25 NOTE — ED Notes (Signed)
Report to [place], Therapist, sports

## 2022-10-26 DIAGNOSIS — F1510 Other stimulant abuse, uncomplicated: Secondary | ICD-10-CM | POA: Insufficient documentation

## 2022-10-26 DIAGNOSIS — F1994 Other psychoactive substance use, unspecified with psychoactive substance-induced mood disorder: Secondary | ICD-10-CM | POA: Insufficient documentation

## 2022-10-26 MED ORDER — QUETIAPINE FUMARATE 200 MG PO TABS: 200.0000 mg | ORAL_TABLET | Freq: Every day | ORAL | 0 refills | Status: DC

## 2022-10-26 MED ORDER — DULOXETINE HCL 30 MG PO CPEP: 30.0000 mg | ORAL_CAPSULE | Freq: Every day | ORAL | 0 refills | Status: DC

## 2022-10-26 NOTE — ED Notes (Signed)
Provider at bedside
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
## 2022-10-26 NOTE — ED Notes (Signed)
This RN went over pt discharge paper work with the client, and returned pt belongings to patient.

## 2022-10-26 NOTE — ED Notes (Signed)
Pt taking a shower 

## 2022-10-26 NOTE — ED Provider Notes (Addendum)
Emergency Medicine Observation Re-evaluation Note  Daniel [name] is a 32 y.o. male, seen on rounds today.  Pt initially presented to the ED for complaints of Suicidal  Currently, the patient is sleeping.  Physical Exam  BP 117/75 (BP Location: Left Arm)   Pulse 81   Temp 98.9 F (37.2 C)   Resp 16   SpO2 98%  Physical Exam General: nad Cardiac: regular rate Lungs: normal effort, no distress Psych:   ED Course / MDM  EKG:EKG Interpretation  Date/Time:  Monday October 23 2022 20:36:58 EDT Ventricular Rate:  89 PR Interval:  158 QRS Duration: 86 QT Interval:  316 QTC Calculation: 384 R Axis:   80 Text Interpretation: Normal sinus rhythm Septal infarct , age undetermined Abnormal ECG When compared with ECG of 09-Apr-2021 07:38, PREVIOUS ECG IS PRESENT No sig changes Confirmed by Octaviano Glow [phone]) on 10/24/2022 10:09:29 AM  I have reviewed the labs performed to date as well as medications administered while in observation.  Recent changes in the last 24 hours include no events.  Plan  Current plan is for inpatient treatment.Dorie Rank, MD 10/26/22 [phone] Patient reassessed by psychiatry team this morning.  Patient is now cleared for discharge with outpatient management.   Dorie Rank, MD 10/26/22 [phone]

## 2022-10-26 NOTE — Discharge Instructions (Addendum)
Discharge recommendations:   Medications: Patient is to take medications as prescribed. The patient or patient's guardian is to contact a medical professional and/or outpatient provider to address any new side effects that develop. The patient or the patient's guardian should update outpatient providers of any new medications and/or medication changes.   Outpatient Follow up: Please review list of outpatient resources for psychiatry and counseling. Please follow up with your primary care provider for all medical related needs.   You are encouraged to follow up with [facility] for outpatient treatment.  Walk in/ Open Access Hours: Monday - Friday 8AM - 11AM (To see provider and therapist) Friday - [place] (To see therapist only)  [facility] [place], [place]  Substance Abuse: Please review resource guide for residential substance abuse facilities. Please follow up with [facility] for bed availability for residential treatment on Monday, 10/30/22.  Therapy: We recommend that patient participate in individual therapy to address mental health concerns.  Housing. Please go to the [facility] for community resources. Please see shelter resource guide for local shelters in the area.   Safety:   The following safety precautions should be taken:   No sharp objects. This includes scissors, razors, scrapers, and putty knives.   Chemicals should be removed and locked up.   Medications should be removed and locked up.   Weapons should be removed and locked up. This includes firearms, knives and instruments that can be used to cause injury.   The patient should abstain from use of illicit substances/drugs and abuse of any medications.  If symptoms worsen or do not continue to improve or if the patient becomes actively suicidal or homicidal then it is recommended that the patient return to the closest hospital emergency department, the  [facility], or call 911 for further evaluation and treatment. National Suicide Prevention Lifeline 1-800-SUICIDE or (864)598-9294.  About 988 988 offers 24/7 access to trained crisis counselors who can help people experiencing mental health-related distress. People can call or text 988 or chat 988lifeline.org for themselves or if they are worried about a loved one who may need crisis support.

## 2022-10-26 NOTE — Progress Notes (Signed)
CSW added community resources: [place] to apply for Medicaid due to pt having no insurance coverage which is a barrier to services. CSW added [facility] and [facility] community resources to follow up per the request of provider Darrol Angel, NP. Pt is being psych cleared. TOC to assist with any discharge needs.  Benjaman Kindler, MSW, LCSWA 10/26/2022 11:15 AM

## 2022-11-01 ENCOUNTER — Telehealth (INDEPENDENT_AMBULATORY_CARE_PROVIDER_SITE_OTHER): Payer: Self-pay

## 2022-11-01 NOTE — Transitions of Care (Post Inpatient/ED Visit) (Signed)
   11/01/2022  Name: Daniel [name] MRN: [id number] DOB: [birth year]  Today's TOC FU Call Status: Today's TOC FU Call Status:: Unsuccessul Call (1st Attempt) Unsuccessful Call (1st Attempt) Date: 11/01/22  Attempted to reach the patient regarding the most recent Inpatient/ED visit.  Follow Up Plan: Additional outreach attempts will be made to reach the patient to complete the Transitions of Care (Post Inpatient/ED visit) call.   [place] LPN [place] Advisor Direct Dial [phone]

## 2022-11-02 NOTE — Transitions of Care (Post Inpatient/ED Visit) (Signed)
   11/02/2022  Name: Daniel [name] MRN: [id number] DOB: 11/10/1990  Today's TOC FU Call Status: Today's TOC FU Call Status:: Unsuccessful Call (2nd Attempt) Unsuccessful Call (1st Attempt) Date: 11/01/22 Unsuccessful Call (2nd Attempt) Date: 11/02/22  Attempted to reach the patient regarding the most recent Inpatient/ED visit.  Follow Up Plan: Additional outreach attempts will be made to reach the patient to complete the Transitions of Care (Post Inpatient/ED visit) call.   [place] LPN [place] Advisor Direct Dial [phone]

## 2022-11-07 NOTE — Transitions of Care (Post Inpatient/ED Visit) (Deleted)
   11/07/2022  Name: Daniel [name] MRN: [id number] DOB: 12/15/1990  Today's TOC FU Call Status: Today's TOC FU Call Status:: Unsuccessful Call (2nd Attempt) Unsuccessful Call (1st Attempt) Date: 11/01/22 Unsuccessful Call (2nd Attempt) Date: 11/02/22  Attempted to reach the patient regarding the most recent Inpatient/ED visit.  Follow Up Plan: No further outreach attempts will be made at this time. We have been unable to contact the patient.  Signature ***

## 2022-11-07 NOTE — Transitions of Care (Post Inpatient/ED Visit) (Signed)
   11/07/2022  Name: Daniel [name] MRN: [id number] DOB: Jan 27, 1991  Today's TOC FU Call Status: Today's TOC FU Call Status:: Unsuccessful Call (3rd Attempt) Unsuccessful Call (1st Attempt) Date: 11/01/22 Unsuccessful Call (2nd Attempt) Date: 11/02/22 Unsuccessful Call (3rd Attempt) Date: 11/07/22  Attempted to reach the patient regarding the most recent Inpatient/ED visit.  Follow Up Plan: No further outreach attempts will be made at this time. We have been unable to contact the patient.  Norton Blizzard, [place] ([facility])  [place] [facility] [phone]

## 2022-11-19 ENCOUNTER — Other Ambulatory Visit: Payer: Self-pay

## 2022-11-19 ENCOUNTER — Emergency Department (HOSPITAL_COMMUNITY)
Admission: EM | Admit: 2022-11-19 | Discharge: 2022-11-20 | Disposition: A | Discharge status: Other Acute Inpatient Hospital | Payer: 59 | Attending: Emergency Medicine | Admitting: Emergency Medicine

## 2022-11-19 DIAGNOSIS — F17210 Nicotine dependence, cigarettes, uncomplicated: Secondary | ICD-10-CM | POA: Insufficient documentation

## 2022-11-19 DIAGNOSIS — F1590 Other stimulant use, unspecified, uncomplicated: Secondary | ICD-10-CM | POA: Insufficient documentation

## 2022-11-19 DIAGNOSIS — Z20822 Contact with and (suspected) exposure to covid-19: Secondary | ICD-10-CM | POA: Insufficient documentation

## 2022-11-19 DIAGNOSIS — F1220 Cannabis dependence, uncomplicated: Secondary | ICD-10-CM | POA: Diagnosis not present

## 2022-11-19 DIAGNOSIS — F1994 Other psychoactive substance use, unspecified with psychoactive substance-induced mood disorder: Secondary | ICD-10-CM | POA: Diagnosis present

## 2022-11-19 DIAGNOSIS — R45851 Suicidal ideations: Secondary | ICD-10-CM | POA: Diagnosis not present

## 2022-11-19 DIAGNOSIS — F1914 Other psychoactive substance abuse with psychoactive substance-induced mood disorder: Secondary | ICD-10-CM | POA: Diagnosis present

## 2022-11-19 DIAGNOSIS — F1510 Other stimulant abuse, uncomplicated: Secondary | ICD-10-CM | POA: Diagnosis present

## 2022-11-19 LAB — CBC
HCT: 43.6 % (ref 39.0–52.0)
Hemoglobin: 14.6 g/dL (ref 13.0–17.0)
MCH: 30.6 pg (ref 26.0–34.0)
MCHC: 33.5 g/dL (ref 30.0–36.0)
MCV: 91.4 fL (ref 80.0–100.0)
Platelets: 244 10*3/uL (ref 150–400)
RBC: 4.77 MIL/uL (ref 4.22–5.81)
RDW: 13.2 % (ref 11.5–15.5)
WBC: 6.2 10*3/uL (ref 4.0–10.5)
nRBC: 0 % (ref 0.0–0.2)

## 2022-11-19 MED ORDER — DULOXETINE HCL 30 MG PO CPEP
60.0000 mg | ORAL_CAPSULE | ORAL | Status: AC
  Administered 2022-11-20: 60 mg via ORAL
  Filled 2022-11-19: qty 2

## 2022-11-19 NOTE — ED Notes (Signed)
Pt has been wanded by security and belongings removed, changed into scrubs

## 2022-11-19 NOTE — ED Triage Notes (Signed)
Pt reporting that he feels suicidal, he has plans to walk out in traffic or just take a lot of medications. Pt says he feels like others are "out to get me" says he hears them says "there he goes". Reports last use of marijuana just a couple of hours ago and using meth about 2 days ago. Has not been taking his prescribed medications for about a month.

## 2022-11-20 ENCOUNTER — Encounter (HOSPITAL_COMMUNITY): Payer: Self-pay | Admitting: Nurse Practitioner

## 2022-11-20 ENCOUNTER — Inpatient Hospital Stay (HOSPITAL_COMMUNITY)
Admission: AD | Admit: 2022-11-20 | Discharge: 2022-11-27 | DRG: 897 | Disposition: A | Discharge status: Home / Self Care | Payer: 59 | Source: Intra-hospital | Attending: Psychiatry | Admitting: Psychiatry

## 2022-11-20 DIAGNOSIS — F419 Anxiety disorder, unspecified: Secondary | ICD-10-CM | POA: Diagnosis present

## 2022-11-20 DIAGNOSIS — F17210 Nicotine dependence, cigarettes, uncomplicated: Secondary | ICD-10-CM | POA: Diagnosis present

## 2022-11-20 DIAGNOSIS — R451 Restlessness and agitation: Secondary | ICD-10-CM | POA: Diagnosis present

## 2022-11-20 DIAGNOSIS — Z79899 Other long term (current) drug therapy: Secondary | ICD-10-CM

## 2022-11-20 DIAGNOSIS — Z5941 Food insecurity: Secondary | ICD-10-CM | POA: Diagnosis not present

## 2022-11-20 DIAGNOSIS — Z5982 Transportation insecurity: Secondary | ICD-10-CM

## 2022-11-20 DIAGNOSIS — F1494 Cocaine use, unspecified with cocaine-induced mood disorder: Secondary | ICD-10-CM | POA: Diagnosis not present

## 2022-11-20 DIAGNOSIS — F1594 Other stimulant use, unspecified with stimulant-induced mood disorder: Principal | ICD-10-CM | POA: Diagnosis present

## 2022-11-20 DIAGNOSIS — Z560 Unemployment, unspecified: Secondary | ICD-10-CM

## 2022-11-20 DIAGNOSIS — Z5986 Financial insecurity: Secondary | ICD-10-CM | POA: Diagnosis not present

## 2022-11-20 DIAGNOSIS — Z813 Family history of other psychoactive substance abuse and dependence: Secondary | ICD-10-CM | POA: Diagnosis not present

## 2022-11-20 DIAGNOSIS — R45851 Suicidal ideations: Secondary | ICD-10-CM | POA: Diagnosis present

## 2022-11-20 DIAGNOSIS — F1994 Other psychoactive substance use, unspecified with psychoactive substance-induced mood disorder: Secondary | ICD-10-CM | POA: Diagnosis present

## 2022-11-20 DIAGNOSIS — F1914 Other psychoactive substance abuse with psychoactive substance-induced mood disorder: Secondary | ICD-10-CM | POA: Diagnosis not present

## 2022-11-20 DIAGNOSIS — F319 Bipolar disorder, unspecified: Secondary | ICD-10-CM | POA: Diagnosis present

## 2022-11-20 DIAGNOSIS — Z5900 Homelessness unspecified: Secondary | ICD-10-CM

## 2022-11-20 DIAGNOSIS — R440 Auditory hallucinations: Secondary | ICD-10-CM | POA: Diagnosis present

## 2022-11-20 LAB — SARS CORONAVIRUS 2 BY RT PCR: SARS Coronavirus 2 by RT PCR: NEGATIVE

## 2022-11-20 LAB — COMPREHENSIVE METABOLIC PANEL
ALT: 12 U/L (ref 0–44)
AST: 27 U/L (ref 15–41)
Albumin: 3.9 g/dL (ref 3.5–5.0)
Alkaline Phosphatase: 72 U/L (ref 38–126)
Anion gap: 9 (ref 5–15)
BUN: 15 mg/dL (ref 6–20)
CO2: 27 mmol/L (ref 22–32)
Calcium: 8.8 mg/dL — ABNORMAL LOW (ref 8.9–10.3)
Chloride: 101 mmol/L (ref 98–111)
Creatinine, Ser: 1.05 mg/dL (ref 0.61–1.24)
GFR, Estimated: >60 mL/min (ref >60)
Glucose, Bld: 94 mg/dL (ref 70–99)
Potassium: 3.4 mmol/L — ABNORMAL LOW (ref 3.5–5.1)
Sodium: 137 mmol/L (ref 135–145)
Total Bilirubin: 0.5 mg/dL (ref 0.3–1.2)
Total Protein: 7.2 g/dL (ref 6.5–8.1)

## 2022-11-20 LAB — SALICYLATE LEVEL: Salicylate Lvl: <7 mg/dL — ABNORMAL LOW (ref 7.0–30.0)

## 2022-11-20 LAB — ACETAMINOPHEN LEVEL: Acetaminophen (Tylenol), Serum: <10 ug/mL — ABNORMAL LOW (ref 10–30)

## 2022-11-20 LAB — ETHANOL: Alcohol, Ethyl (B): <10 mg/dL (ref <10)

## 2022-11-20 MED ORDER — QUETIAPINE FUMARATE 200 MG PO TABS
200.0000 mg | ORAL_TABLET | Freq: Every day | ORAL | Status: DC
  Administered 2022-11-20 – 2022-11-21 (×2): 200 mg via ORAL
  Filled 2022-11-20 (×4): qty 1

## 2022-11-20 MED ORDER — HALOPERIDOL 5 MG PO TABS
5.0000 mg | ORAL_TABLET | Freq: Three times a day (TID) | ORAL | Status: DC | PRN
  Administered 2022-11-26: 5 mg via ORAL
  Filled 2022-11-20: qty 1

## 2022-11-20 MED ORDER — QUETIAPINE FUMARATE 100 MG PO TABS: 200.0000 mg | ORAL_TABLET | Freq: Every day | ORAL | Status: DC

## 2022-11-20 MED ORDER — HALOPERIDOL LACTATE 5 MG/ML IJ SOLN: 5.0000 mg | Freq: Three times a day (TID) | INTRAMUSCULAR | Status: DC | PRN

## 2022-11-20 MED ORDER — DIPHENHYDRAMINE HCL 25 MG PO CAPS: 50.0000 mg | ORAL_CAPSULE | Freq: Three times a day (TID) | ORAL | Status: DC | PRN

## 2022-11-20 MED ORDER — IBUPROFEN 600 MG PO TABS: 600.0000 mg | ORAL_TABLET | Freq: Four times a day (QID) | ORAL | Status: DC | PRN

## 2022-11-20 MED ORDER — MAGNESIUM HYDROXIDE 400 MG/5ML PO SUSP
30.0000 mL | Freq: Every day | ORAL | Status: DC | PRN
  Administered 2022-11-23: 30 mL via ORAL
  Filled 2022-11-20: qty 30

## 2022-11-20 MED ORDER — LORAZEPAM 1 MG PO TABS: 2.0000 mg | ORAL_TABLET | Freq: Three times a day (TID) | ORAL | Status: DC | PRN

## 2022-11-20 MED ORDER — LORAZEPAM 2 MG/ML IJ SOLN: 2.0000 mg | Freq: Three times a day (TID) | INTRAMUSCULAR | Status: DC | PRN

## 2022-11-20 MED ORDER — ALUM & MAG HYDROXIDE-SIMETH 200-200-20 MG/5ML PO SUSP: 30.0000 mL | ORAL | Status: DC | PRN

## 2022-11-20 MED ORDER — DULOXETINE HCL 60 MG PO CPEP
60.0000 mg | ORAL_CAPSULE | Freq: Every day | ORAL | Status: DC
  Administered 2022-11-21: 60 mg via ORAL
  Filled 2022-11-20 (×4): qty 1

## 2022-11-20 MED ORDER — HYDROXYZINE HCL 25 MG PO TABS
25.0000 mg | ORAL_TABLET | Freq: Three times a day (TID) | ORAL | Status: DC | PRN
  Administered 2022-11-21 – 2022-11-26 (×4): 25 mg via ORAL
  Filled 2022-11-20 (×4): qty 1

## 2022-11-20 MED ORDER — HYDROXYZINE HCL 25 MG PO TABS: 25.0000 mg | ORAL_TABLET | Freq: Three times a day (TID) | ORAL | Status: DC | PRN

## 2022-11-20 MED ORDER — IBUPROFEN 200 MG PO TABS: 600.0000 mg | ORAL_TABLET | Freq: Four times a day (QID) | ORAL | Status: DC | PRN

## 2022-11-20 MED ORDER — DIPHENHYDRAMINE HCL 50 MG/ML IJ SOLN: 50.0000 mg | Freq: Three times a day (TID) | INTRAMUSCULAR | Status: DC | PRN

## 2022-11-20 NOTE — Progress Notes (Signed)
Pt was accepted to [facility] TODAY 11/20/2022, pending negative covid and signed voluntary consent faxed to [phone]. Bed assignment: 406-2  Pt meets inpatient criteria per Ophelia Shoulder, NP  Attending Physician will be Phineas Inches, MD  Report can be called to: - Adult unit: [phone]  Pt can arrive after pending items are received  Care Team Notified: [facility] Rona Ravens, RN, Ophelia Shoulder, NP, Genice Rouge, NT, Dahlia Byes, NP, Jeral Fruit, RN, and Kristine Royal, MD  [place], [place]  11/20/2022 2:20 PM

## 2022-11-20 NOTE — ED Notes (Signed)
Safe transport called for transportation to [facility] 

## 2022-11-20 NOTE — ED Provider Notes (Signed)
[place] EMERGENCY DEPARTMENT AT [facility] Provider Note   CSN: [id number] Arrival date & time: 11/19/22  2218     History  Chief Complaint  Patient presents with   Suicidal    Daniel [name] is a 32 y.o. male.  HPI Patient is a 32 year old male with past medical history significant for depression and suicidal thoughts  Patient states he is suicidal he states that his symptoms began today and he states that he plans to walk into traffic  He denies any physical pain.  Reports that he did use methamphetamine 2 days ago.  He states he has not been taking his prescribed medications    Home Medications Prior to Admission medications   Medication Sig Start Date End Date Taking? Authorizing Provider  DULoxetine (CYMBALTA) 30 MG capsule Take 1 capsule (30 mg total) by mouth daily. 10/27/22  Yes White, Patrice L, NP  QUEtiapine (SEROQUEL) 200 MG tablet Take 1 tablet (200 mg total) by mouth at bedtime. 10/26/22  Yes White, Patrice L, NP      Allergies    Tylenol [acetaminophen]    Review of Systems   Review of Systems  Physical Exam Updated Vital Signs BP 122/63 (BP Location: Left Arm)   Pulse 89   Temp 97.8 F (36.6 C) (Oral)   Resp 16   Ht  (1.727 m)   Wt 68 kg   SpO2 97%   BMI 22.81 kg/m  Physical Exam Vitals and nursing note reviewed.  Constitutional:      General: He is not in acute distress. HENT:     Head: Normocephalic and atraumatic.     Nose: Nose normal.  Eyes:     General: No scleral icterus. Cardiovascular:     Rate and Rhythm: Normal rate and regular rhythm.     Pulses: Normal pulses.     Heart sounds: Normal heart sounds.  Pulmonary:     Effort: Pulmonary effort is normal. No respiratory distress.     Breath sounds: No wheezing.  Abdominal:     Palpations: Abdomen is soft.     Tenderness: There is no abdominal tenderness.  Musculoskeletal:     Cervical back: Normal range of motion.     Right lower leg: No edema.      Left lower leg: No edema.  Skin:    General: Skin is warm and dry.     Capillary Refill: Capillary refill takes less than 2 seconds.  Neurological:     Mental Status: He is alert. Mental status is at baseline.  Psychiatric:        Mood and Affect: Mood normal.        Behavior: Behavior normal.     Comments: Calm, euthymic      ED Results / Procedures / Treatments   Labs (all labs ordered are listed, but only abnormal results are displayed) Labs Reviewed  COMPREHENSIVE METABOLIC PANEL - Abnormal; Notable for the following components:      Result Value   Potassium 3.4 (*)    Calcium 8.8 (*)    All other components within normal limits  SALICYLATE LEVEL - Abnormal; Notable for the following components:   Salicylate Lvl <7.0 (*)    All other components within normal limits  ACETAMINOPHEN LEVEL - Abnormal; Notable for the following components:   Acetaminophen (Tylenol), Serum <10 (*)    All other components within normal limits  ETHANOL  CBC  RAPID URINE DRUG SCREEN, HOSP PERFORMED  EKG None  Radiology No results found.  Procedures Procedures    Medications Ordered in ED Medications  DULoxetine (CYMBALTA) DR capsule 60 mg (60 mg Oral Given 11/20/22 0004)    ED Course/ Medical Decision Making/ A&P                             Medical Decision Making Amount and/or Complexity of Data Reviewed Labs: ordered.  Risk Prescription drug management.   Patient is a 32 year old male with past medical history significant for depression and suicidal thoughts  Patient states he is suicidal he states that his symptoms began today and he states that he plans to walk into traffic  He denies any physical pain.  Reports that he did use methamphetamine 2 days ago.  He states he has not been taking his prescribed medications  Patient with reassuring exam.  Seems to have a somewhat chronic suicidality however does seem to be episodic in nature.  Patient psychiatrically  cleared.  CMP CBC unremarkable Tylenol salicylate and ethanol undetectably  Awaiting TTS consultation.  Patient is voluntary   Final Clinical Impression(s) / ED Diagnoses Final diagnoses:  Suicidal ideation    Rx / DC Orders ED Discharge Orders     None         Gailen Shelter, [place] 11/20/22 0403    Tilden Fossa, MD 11/20/22 [phone]

## 2022-11-20 NOTE — ED Notes (Signed)
Snack and drink provided to patient.

## 2022-11-20 NOTE — Progress Notes (Addendum)
ADDENDUM  PT WAS ACCEPTED TO [facility] TODAY 11/20/2022. SEE NOTE ABOVE.   Pt was accepted to [facility] 11/21/2022. Bed assignment: Main campus  Pt meets inpatient criteria per Ophelia Shoulder, NP  Attending Physician will be Loni Beckwith, MD  Report can be called to: [phone] (this is a pager, please leave call-back number when giving report)  Pt can arrive after 8 AM  Care Team Notified: Ophelia Shoulder, NP, Rona Ravens, RN, Jeral Fruit, RN, and [facility], NT  [place], [place]  11/20/2022 1:33 PM

## 2022-11-20 NOTE — Progress Notes (Signed)
   11/20/22 2200  Psych Admission Type (Psych Patients Only)  Admission Status Voluntary  Psychosocial Assessment  Patient Complaints Depression  Eye Contact Brief  Facial Expression Flat  Affect Euphoric  Speech Soft;Slow  Interaction Minimal;Isolative  Motor Activity Slow  Appearance/Hygiene In scrubs  Behavior Characteristics Cooperative  Mood Depressed  Aggressive Behavior  Effect No apparent injury  Thought Process  Coherency WDL  Content WDL  Delusions None reported or observed  Perception WDL  Hallucination None reported or observed  Judgment Limited  Confusion None  Danger to Self  Current suicidal ideation? Passive  Self-Injurious Behavior No self-injurious ideation or behavior indicators observed or expressed   Agreement Not to Harm Self Yes  Description of Agreement verbal  Danger to Others  Danger to Others None reported or observed

## 2022-11-20 NOTE — ED Notes (Signed)
One bag of patient belongings sent with patient to [facility].

## 2022-11-20 NOTE — ED Provider Notes (Signed)
Emergency Medicine Observation Re-evaluation Note  Daniel [name] is a 32 y.o. male, seen on rounds today.  Pt initially presented to the ED for complaints of Suicidal Currently, the patient is resting comfortably.  Physical Exam  BP 122/63 (BP Location: Left Arm)   Pulse 89   Temp 97.8 F (36.6 C) (Oral)   Resp 16   Ht 5\' 8"  (1.727 m)   Wt 68 kg   SpO2 97%   BMI 22.81 kg/m  Physical Exam General: NAD   ED Course / MDM  EKG:   I have reviewed the labs performed to date as well as medications administered while in observation.  Recent changes in the last 24 hours include no acute events reported.  Plan  Current plan is for resting comfortably.    Wynetta Fines, MD 11/20/22 [phone]

## 2022-11-20 NOTE — Progress Notes (Signed)
LCSW Progress Note  [id number]   Daniel [name]  11/20/2022  1:20 PM  Description:   Inpatient Psychiatric Referral  Patient was recommended inpatient per Ophelia Shoulder, NP. There are no available beds at [facility]. Patient was referred to the following facilities:   Destination  Service Provider Address Phone Fax  [facility]  [address] [place], [place] [address] [phone]  [facility] [address] [place]  [address] [place] [address] [phone]  [facility]  [address] [place] [address] [phone]  [facility]  [address] [place] [address] [phone]  [facility]  [address] [place] [address] [phone]  [facility]  [address] [place], [place] [address] [phone]  [facility]  [address] [place] [address] [phone]  [facility]  [address] [place] [address] [phone]  [facility]  [address] [place] [address] [phone]  [facility]  [address] [place] [address] [phone]  [facility]  [address] [place] [address] [phone]  [facility]  [address] [place] [address] [phone]  [facility]  [address] [place] [address] [phone]  [facility]  [address], [place] [address] [phone]  [facility]  [address] [place] [address] [phone]  [facility]  [address], [place]  [address] [phone]  [facility]  [address], [place] [address] [phone]  [facility]  [address] [place], [place] [address] [phone]  [facility]  [address] [place] [address] [phone]  [facility]  [address] [place] [address] [phone]  [facility]  [address], [place] [address] [phone]  [facility]  [address] [place] [address] [phone]  [facility]  [address], [place] [address] [phone]  [facility]  [address] [place] [address] [phone]  [facility]  [address] [place] [address] [phone]  [facility] [address]  [address] [place] [address] [phone]  [facility]  [address] [place], [place] [address] [phone]    Situation ongoing, CSW to continue following and update chart as more information becomes available.      Cathie Beams, LCSWA  11/20/2022 1:20 PM

## 2022-11-20 NOTE — Progress Notes (Signed)
Pt refused BP check.

## 2022-11-20 NOTE — Progress Notes (Signed)
Psychoeducational Group Note  Date:  11/20/2022 Time:  2125  Group Topic/Focus:  Wrap-Up Group:   The focus of this group is to help patients review their daily goal of treatment and discuss progress on daily workbooks.  Participation Level: Did Not Attend  Participation Quality:  Not Applicable  Affect:  Not Applicable  Cognitive:  Not Applicable  Insight:  Not Applicable  Engagement in Group: Not Applicable  Additional Comments:  The patient did not attend the evening group.   Hazle Coca S 11/20/2022, 9:25 PM

## 2022-11-20 NOTE — Progress Notes (Signed)
Pt admitted voluntarily to [facility] adult unit.  Pt stated he was admitted d/t experiencing suicidal ideation but was unable to verbalize a specific stressor. Pt stated he is tired of living and wish to be dead.  He shared he wish he could go to sleep and not wake up.  He does not understand why God keeps allowing him to wake up in the morning.  He stated, life is "hell".  Pt stated he will be safe while here but once discharged he plans to OD on drugs.  Pt reports being homeless which doesn't help.  Supportive listening provided.  Pt re-oriented to unit.  Pt safe on unit.
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
## 2022-11-20 NOTE — ED Notes (Signed)
Report given to Regina at [facility]. 

## 2022-11-21 DIAGNOSIS — F1494 Cocaine use, unspecified with cocaine-induced mood disorder: Secondary | ICD-10-CM

## 2022-11-21 MED ORDER — DULOXETINE HCL 30 MG PO CPEP
30.0000 mg | ORAL_CAPSULE | Freq: Every day | ORAL | Status: DC
  Administered 2022-11-22 – 2022-11-27 (×6): 30 mg via ORAL
  Filled 2022-11-21 (×8): qty 1

## 2022-11-21 MED ORDER — QUETIAPINE FUMARATE 100 MG PO TABS
100.0000 mg | ORAL_TABLET | ORAL | Status: DC
  Administered 2022-11-22 – 2022-11-27 (×6): 100 mg via ORAL
  Filled 2022-11-21 (×8): qty 1

## 2022-11-21 NOTE — Progress Notes (Signed)
Pt stated he keeps to himself in the room due to the voices, pt acknowledge that there was a lot of extra stimulation that gives him trouble on the milieu

## 2022-11-21 NOTE — Progress Notes (Signed)
Pt stays to himself much of the evening, pt state his AVH were not getting better , pt stated he would like to go up on his antipsychotic , pt encouraged to discuss with his doctor    11/21/22 2030  Psych Admission Type (Psych Patients Only)  Admission Status Voluntary  Psychosocial Assessment  Patient Complaints Suspiciousness;Anxiety  Eye Contact Fair  Facial Expression Flat  Affect Depressed  Speech Slow;Soft  Interaction Minimal;Isolative  Motor Activity Slow  Appearance/Hygiene Disheveled  Behavior Characteristics Cooperative  Mood Preoccupied;Suspicious;Depressed  Aggressive Behavior  Effect No apparent injury  Thought Process  Coherency WDL  Content WDL  Delusions WDL  Perception WDL  Hallucination None reported or observed  Judgment Limited  Confusion None  Danger to Self  Current suicidal ideation? Passive  Self-Injurious Behavior Some self-injurious ideation observed or expressed.  No lethal plan expressed   Agreement Not to Harm Self Yes  Description of Agreement verbal contract for safety  Danger to Others  Danger to Others None reported or observed

## 2022-11-21 NOTE — [facility] Suicide Risk Assessment (Signed)
[facility] INPATIENT:  Family/Significant Other Suicide Prevention Education  Suicide Prevention Education:  Patient Refusal for Family/Significant Other Suicide Prevention Education: The patient Daniel [name] has refused to provide written consent for family/significant other to be provided Family/Significant Other Suicide Prevention Education during admission and/or prior to discharge.  Physician notified.  Corky Crafts 11/21/2022, 3:50 PM

## 2022-11-21 NOTE — Group Note (Signed)
Date:  11/21/2022 Time:  11:29 AM  Group Topic/Focus:  Goals Group:   The focus of this group is to help patients establish daily goals to achieve during treatment and discuss how the patient can incorporate goal setting into their daily lives to aide in recovery. Orientation:   The focus of this group is to educate the patient on the purpose and policies of crisis stabilization and provide a format to answer questions about their admission.  The group details unit policies and expectations of patients while admitted.    Participation Level:  Did Not Attend  Participation Quality:   n/a  Affect:   n/a  Cognitive:   n/a  Insight: None  Engagement in Group:   n/a  Modes of Intervention:   n/a  Additional Comments:   Pt did not attend.  Edmund Hilda Gray Doering 11/21/2022, 11:29 AM
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
## 2022-11-21 NOTE — Progress Notes (Signed)
   11/21/22 0551  15 Minute Checks  Location Bedroom  Visual Appearance Calm  Behavior Sleeping  Sleep (Behavioral Health Patients Only)  Calculate sleep? (Click Yes once per 24 hr at 0600 safety check) Yes  Documented sleep last 24 hours 7.25

## 2022-11-21 NOTE — Progress Notes (Signed)
   11/21/22 1000  Psych Admission Type (Psych Patients Only)  Admission Status Voluntary  Psychosocial Assessment  Patient Complaints Depression  Eye Contact Brief  Facial Expression Flat  Affect Depressed  Speech Soft;Slow  Interaction Minimal;Isolative  Motor Activity Slow  Appearance/Hygiene In scrubs  Behavior Characteristics Cooperative  Mood Depressed  Thought Process  Coherency WDL  Content WDL  Delusions WDL  Perception WDL  Hallucination None reported or observed  Judgment Limited  Confusion None  Danger to Self  Current suicidal ideation? Passive  Self-Injurious Behavior No self-injurious ideation or behavior indicators observed or expressed   Agreement Not to Harm Self Yes  Description of Agreement verbal
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
## 2022-11-21 NOTE — Progress Notes (Signed)
Patient is refusing to get his vitals checked.

## 2022-11-21 NOTE — [facility] Group Notes (Signed)
Did not attend wrap up group

## 2022-11-21 NOTE — Group Note (Signed)
Recreation Therapy Group Note   Group Topic:Animal Assisted Therapy   Group Date: 11/21/2022 Start Time: 0950 End Time: 1030 Facilitators:Daniel [name], LRT,CTRS Location: [address]   Animal-Assisted Activity (AAA) Program Checklist/Progress Notes Patient Eligibility Criteria Checklist & Daily Group note for Rec Tx Intervention  AAA/T Program Assumption of Risk Form signed by Patient/ or Parent Legal Guardian Yes  Patient understands his/her participation is voluntary Yes   Affect/Mood: N/A   Participation Level: Did not attend    Clinical Observations/Individualized Feedback:     Plan: Continue to engage patient in RT group sessions 2-3x/week.   Daniel [name], LRT,CTRS 11/21/2022 1:03 PM

## 2022-11-21 NOTE — Progress Notes (Addendum)
B[facility]/BMU/FBC LCSW Progress Note   11/21/2022    3:50 PM  Daniel [name]   [phone]   Type of Contact and Topic:  Care Coordination   CSW met with patient at bedside to provide care coordination and complete PSA. Patient is currently denying SI, HI, AVH; no other psychotic features observed. Presents as irritable, often cussing at [employer]. States he is not interested in attending any treatment that will make him participate in any meaningful capacity. In explaining SUD treatment options, patient has denied all available resources stating "I am not doing fucking anything I do not want to do." Patient requests to discharge at this time. Patient is currently homeless, secondary gain can not be ruled out.   Physician notified.    Signed:  Corky Crafts, MSW, LCSW, LCAS 11/21/2022 3:50 PM

## 2022-11-22 ENCOUNTER — Encounter (HOSPITAL_COMMUNITY): Payer: Self-pay

## 2022-11-22 DIAGNOSIS — F1494 Cocaine use, unspecified with cocaine-induced mood disorder: Secondary | ICD-10-CM | POA: Diagnosis not present

## 2022-11-22 MED ORDER — BOOST / RESOURCE BREEZE PO LIQD CUSTOM
1.0000 | Freq: Three times a day (TID) | ORAL | Status: DC
  Administered 2022-11-22 – 2022-11-26 (×10): 1 via ORAL
  Filled 2022-11-22 (×22): qty 1

## 2022-11-22 MED ORDER — QUETIAPINE FUMARATE 400 MG PO TABS
400.0000 mg | ORAL_TABLET | Freq: Every day | ORAL | Status: DC
  Administered 2022-11-22 – 2022-11-26 (×5): 400 mg via ORAL
  Filled 2022-11-22 (×7): qty 1

## 2022-11-22 NOTE — Group Note (Signed)
Date:  11/22/2022 Time:  10:14 AM  Group Topic/Focus:  Goals Group:   The focus of this group is to help patients establish daily goals to achieve during treatment and discuss how the patient can incorporate goal setting into their daily lives to aide in recovery. Orientation:   The focus of this group is to educate the patient on the purpose and policies of crisis stabilization and provide a format to answer questions about their admission.  The group details unit policies and expectations of patients while admitted.    Participation Level:  Did Not Attend  [facility] 11/22/2022, 10:14 AM

## 2022-11-22 NOTE — Progress Notes (Signed)
MHT informed Pt of his scheduled treatment team meeting. Pt refused to attend and stated, "I'm not doing none of that shit, I wish yall leave me the fuck alone and get me out of here". Consulting civil engineer notified

## 2022-11-22 NOTE — B[facility] Counselor (Signed)
[facility]/BMU LCSW Progress Note   11/22/2022    2:34 PM  Daniel [name]      Type of Note: [facility]  CSW went to visit patient to provide him with a list of treatment programs that accepts his insurance. CSW explained to patient that he would need call the highlighted choices since she stated that he did not want to be in [place] or [address]. CSW explained to patient that once he does his initial intake they will request further paperwork, patient stated " ok, you can sit it down over there " . CSW will continue to assist.     Signed:   Jacob Moores, MSW, [facility] 11/22/2022 2:34 PM
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
## 2022-11-22 NOTE — BH IP Treatment Plan (Signed)
Interdisciplinary Treatment and Diagnostic Plan Update  11/22/2022 Time of Session: 11:05 AM  Daniel [name] MRN: [id number]  Principal Diagnosis: Psychoactive substance-induced mood disorder  Secondary Diagnoses: Principal Problem:   Psychoactive substance-induced mood disorder   Current Medications:  Current Facility-Administered Medications  Medication Dose Route Frequency Provider Last Rate Last Admin   alum & mag hydroxide-simeth (MAALOX/MYLANTA) 200-200-20 MG/5ML suspension 30 mL  30 mL Oral Q4H PRN Welford Roche, [name], NP       diphenhydrAMINE (BENADRYL) capsule 50 mg  50 mg Oral TID PRN Earney Navy, NP       Or   diphenhydrAMINE (BENADRYL) injection 50 mg  50 mg Intramuscular TID PRN Dahlia Byes C, NP       DULoxetine (CYMBALTA) DR capsule 30 mg  30 mg Oral Daily Ntuen, [name], FNP   30 mg at 11/22/22 0839   feeding supplement (BOOST / RESOURCE BREEZE) liquid 1 Container  1 Container Oral TID BM Massengill, Harrold Donath, MD   1 Container at 11/22/22 1040   haloperidol (HALDOL) tablet 5 mg  5 mg Oral TID PRN Earney Navy, NP       Or   haloperidol lactate (HALDOL) injection 5 mg  5 mg Intramuscular TID PRN Earney Navy, NP       hydrOXYzine (ATARAX) tablet 25 mg  25 mg Oral TID PRN Dahlia Byes C, NP   25 mg at 11/21/22 2125   ibuprofen (ADVIL) tablet 600 mg  600 mg Oral Q6H PRN Dahlia Byes C, NP       LORazepam (ATIVAN) tablet 2 mg  2 mg Oral TID PRN Earney Navy, NP       Or   LORazepam (ATIVAN) injection 2 mg  2 mg Intramuscular TID PRN Dahlia Byes C, NP       magnesium hydroxide (MILK OF MAGNESIA) suspension 30 mL  30 mL Oral Daily PRN Dahlia Byes C, NP       QUEtiapine (SEROQUEL) tablet 100 mg  100 mg Oral BH-q7a Ntuen, Tina C, FNP   100 mg at 11/22/22 4098   QUEtiapine (SEROQUEL) tablet 400 mg  400 mg Oral QHS Massengill, Harrold Donath, MD       PTA Medications: Medications Prior to Admission  Medication Sig Dispense  Refill Last Dose   DULoxetine (CYMBALTA) 30 MG capsule Take 1 capsule (30 mg total) by mouth daily. 14 capsule 0    QUEtiapine (SEROQUEL) 200 MG tablet Take 1 tablet (200 mg total) by mouth at bedtime. 14 tablet 0     Patient Stressors:    Patient Strengths:    Treatment Modalities: Medication Management, Group therapy, Case management,  1 to 1 session with clinician, Psychoeducation, Recreational therapy.   Physician Treatment Plan for Primary Diagnosis: Psychoactive substance-induced mood disorder Long Term Goal(s): Improvement in symptoms so as ready for discharge   Short Term Goals: Ability to identify changes in lifestyle to reduce recurrence of condition will improve Ability to verbalize feelings will improve Ability to disclose and discuss suicidal ideas Ability to demonstrate self-control will improve Ability to identify and develop effective coping behaviors will improve Ability to maintain clinical measurements within normal limits will improve Compliance with prescribed medications will improve Ability to identify triggers associated with substance abuse/mental health issues will improve  Medication Management: Evaluate patient's response, side effects, and tolerance of medication regimen.  Therapeutic Interventions: 1 to 1 sessions, Unit Group sessions and Medication administration.  Evaluation of Outcomes: Not Progressing  Physician Treatment Plan  for Secondary Diagnosis: Principal Problem:   Psychoactive substance-induced mood disorder  Long Term Goal(s): Improvement in symptoms so as ready for discharge   Short Term Goals: Ability to identify changes in lifestyle to reduce recurrence of condition will improve Ability to verbalize feelings will improve Ability to disclose and discuss suicidal ideas Ability to demonstrate self-control will improve Ability to identify and develop effective coping behaviors will improve Ability to maintain clinical measurements  within normal limits will improve Compliance with prescribed medications will improve Ability to identify triggers associated with substance abuse/mental health issues will improve     Medication Management: Evaluate patient's response, side effects, and tolerance of medication regimen.  Therapeutic Interventions: 1 to 1 sessions, Unit Group sessions and Medication administration.  Evaluation of Outcomes: Not Progressing   RN Treatment Plan for Primary Diagnosis: Psychoactive substance-induced mood disorder Long Term Goal(s): Knowledge of disease and therapeutic regimen to maintain health will improve  Short Term Goals: Ability to remain free from injury will improve, Ability to verbalize frustration and anger appropriately will improve, Ability to demonstrate self-control, Ability to participate in decision making will improve, Ability to verbalize feelings will improve, Ability to disclose and discuss suicidal ideas, Ability to identify and develop effective coping behaviors will improve, and Compliance with prescribed medications will improve  Medication Management: RN will administer medications as ordered by provider, will assess and evaluate patient's response and provide education to patient for prescribed medication. RN will report any adverse and/or side effects to prescribing provider.  Therapeutic Interventions: 1 on 1 counseling sessions, Psychoeducation, Medication administration, Evaluate responses to treatment, Monitor vital signs and CBGs as ordered, Perform/monitor CIWA, COWS, AIMS and Fall Risk screenings as ordered, Perform wound care treatments as ordered.  Evaluation of Outcomes: Not Progressing   LCSW Treatment Plan for Primary Diagnosis: Psychoactive substance-induced mood disorder Long Term Goal(s): Safe transition to appropriate next level of care at discharge, Engage patient in therapeutic group addressing interpersonal concerns.  Short Term Goals: Engage patient  in aftercare planning with referrals and resources, Increase social support, Increase ability to appropriately verbalize feelings, Increase emotional regulation, Facilitate acceptance of mental health diagnosis and concerns, Facilitate patient progression through stages of change regarding substance use diagnoses and concerns, Identify triggers associated with mental health/substance abuse issues, and Increase skills for wellness and recovery  Therapeutic Interventions: Assess for all discharge needs, 1 to 1 time with Social worker, Explore available resources and support systems, Assess for adequacy in community support network, Educate family and significant other(s) on suicide prevention, Complete Psychosocial Assessment, Interpersonal group therapy.  Evaluation of Outcomes: Not Progressing   Progress in Treatment: Attending groups: No. Participating in groups: No. Taking medication as prescribed: Yes. Toleration medication: Yes. Family/Significant other contact made: No, will contact:  Pt declined  Patient understands diagnosis: Yes. Discussing patient identified problems/goals with staff: Yes. Medical problems stabilized or resolved: Yes. Denies suicidal/homicidal ideation: Yes. Issues/concerns per patient self-inventory: No.   New problem(s) identified: No, Describe:  None reported   New Short Term/Long Term Goal(s):medication stabilization, elimination of SI thoughts, development of comprehensive mental wellness plan.    Patient Goals:  " Housing , get into a residential treatment program in [place] or [place] , learn coping skills, and get my medication adjusted "   Discharge Plan or Barriers: Patient recently admitted. CSW will continue to follow and assess for appropriate referrals and possible discharge planning.    Reason for Continuation of Hospitalization: Anxiety Depression Hallucinations Medication stabilization Suicidal ideation  Estimated  Length of Stay: 5-7 days    Last 3 [place] Suicide Severity Risk Score: Flowsheet Row [facility] (Current[facility] from 11/19/2022 in [facility] at [facility] ED from 10/23/2022 in [facility] Emergency Department at [facility]  C-SSRS RISK CATEGORY High Risk High Risk High Risk       Last [facility] 2/9 Scores:    05/27/2020    3:39 PM 03/26/2020    3:10 PM  Depression screen PHQ 2/9  Decreased Interest 3 3  Down, Depressed, Hopeless 3 3  PHQ - 2 Score 6 6  Altered sleeping 3 1  Tired, decreased energy 3 3  Change in appetite 3 3  Feeling bad or failure about yourself  2 3  Trouble concentrating 3 3  Moving slowly or fidgety/restless 3 1  Suicidal thoughts 3 0  PHQ-9 Score 26 20  Difficult doing work/chores  Extremely dIfficult    Scribe for Treatment Team: Isabella Bowens, LCSWA 11/22/2022 1:25 PM

## 2022-11-22 NOTE — [facility] Group Notes (Signed)
Pt did not attend NA group 

## 2022-11-22 NOTE — Progress Notes (Signed)
   11/22/22 1000  Psych Admission Type (Psych Patients Only)  Admission Status Voluntary  Psychosocial Assessment  Patient Complaints Depression  Eye Contact Fair  Facial Expression Flat  Affect Irritable  Speech Slow  Interaction Minimal  Motor Activity Slow  Appearance/Hygiene Disheveled  Behavior Characteristics Resistant to care  Mood Depressed;Irritable  Thought Process  Coherency WDL  Content WDL  Delusions WDL  Perception WDL  Hallucination None reported or observed  Judgment Limited  Confusion None  Danger to Self  Current suicidal ideation? Denies  Self-Injurious Behavior No self-injurious ideation or behavior indicators observed or expressed   Agreement Not to Harm Self Yes  Description of Agreement verbal  Danger to Others  Danger to Others None reported or observed

## 2022-11-22 NOTE — Group Note (Signed)
Recreation Therapy Group Note   Group Topic:Health and Wellness  Group Date: 11/22/2022 Start Time: 0935 End Time: 1009 Facilitators: Aisley Whan-McCall, LRT,CTRS Location: [address] Dayroom   Goal Area(s) Addresses:   Patient will verbalize benefit of exercise during group session. Patient will verbalize an exercise that can be completed in their hospital room during admission. Patient will identify an exercise that can be completed post d/c.   Group Description:  Exercise.  LRT and patients took turns leading the group in various exercises.  Patients were to complete the exercises based on their physical abilities.  Patients could take breaks as needed and get water if needed.   Affect/Mood: N/A   Participation Level: Did not attend    Clinical Observations/Individualized Feedback:     Plan: Continue to engage patient in RT group sessions 2-3x/week.   Chardonnay Holzmann-McCall, LRT,CTRS 11/22/2022 12:25 PM

## 2022-11-22 NOTE — Progress Notes (Signed)
   11/22/22 2000  Psych Admission Type (Psych Patients Only)  Admission Status Voluntary  Psychosocial Assessment  Patient Complaints Agitation;Depression  Eye Contact Fair  Facial Expression Flat  Affect Depressed;Irritable  Speech Slow;Soft  Interaction Minimal;Isolative  Motor Activity Slow  Appearance/Hygiene Disheveled  Behavior Characteristics Cooperative  Mood Depressed;Labile;Irritable  Aggressive Behavior  Effect No apparent injury  Thought Process  Coherency WDL  Content WDL  Delusions WDL  Perception WDL  Hallucination None reported or observed  Judgment Limited  Confusion None  Danger to Self  Current suicidal ideation? Passive  Self-Injurious Behavior Some self-injurious ideation observed or expressed.  No lethal plan expressed   Agreement Not to Harm Self Yes  Description of Agreement verbal contract for safety  Danger to Others  Danger to Others None reported or observed

## 2022-11-23 DIAGNOSIS — F1494 Cocaine use, unspecified with cocaine-induced mood disorder: Secondary | ICD-10-CM | POA: Diagnosis not present

## 2022-11-23 NOTE — Progress Notes (Signed)
Patient denies SI,HI, and A/V/H with no plan or intent. Patient remains mostly in his room thus far but did come out to ask about the recommended list of treatment programs and to take his medications. Patient appears cooperative this morning and med compliant. Patient observed attempting to make calls to programs.
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
## 2022-11-23 NOTE — Progress Notes (Signed)
Pt stated the 400 mg Seroquel worked well to decrease AVH

## 2022-11-23 NOTE — [facility] Counselor (Signed)
[facility] LCSW Progress Note   11/23/2022    2:03 PM  Daniel [name]      Type of Note: Residential Options    CSW spoke with patient today about the residential list that was provided to him on yesterday. Patient stated that he was open to [facility] or [place] . CSW did fax patient information over to both residential's. Patient did state that his third option was [facility] and CSW informed him that he would need to contact them and then they will request information if they have availability. CSW will continue to assist.     Signed:   Jacob Moores, MSW, [facility] 11/23/2022 2:03 PM

## 2022-11-23 NOTE — Plan of Care (Signed)
  Problem: Safety: Goal: Periods of time without injury will increase Outcome: Progressing   Problem: Health Behavior/Discharge Planning: Goal: Compliance with therapeutic regimen will improve Outcome: Progressing   

## 2022-11-23 NOTE — Group Note (Signed)
Date:  11/23/2022 Time:  12:17 PM  Group Topic/Focus:  Coping With Mental Health Crisis:   The purpose of this group is to help patients identify strategies for coping with mental health crisis.  Group discusses possible causes of crisis and ways to manage them effectively. Emotional Education:   The focus of this group is to discuss what feelings/emotions are, and how they are experienced.    Participation Level:  Active  Participation Quality:  Appropriate  Affect:  Appropriate  Cognitive:  Appropriate  Insight: Appropriate  Engagement in Group:  Limited  Modes of Intervention:  Clarification, Discussion, Exploration, and Support  Additional Comments:    Memory Dance Daniel [name] 11/23/2022, 12:17 PM

## 2022-11-23 NOTE — [facility] Group Notes (Signed)
Group focused on topic of strength. Group members reflected on what thoughts and feelings emerge when they hear this topic. They then engaged in facilitated dialog around how strength is present in their lives. This dialog focused on representing what strength had been to them in their lives (images and patterns given) and what they saw as helpful in their life now (what they needed / wanted).  Activity drew on narrative framework.   Patient did not attend group.  

## 2022-11-23 NOTE — Progress Notes (Signed)
   11/23/22 2100  Psych Admission Type (Psych Patients Only)  Admission Status Voluntary  Psychosocial Assessment  Patient Complaints Isolation  Eye Contact Avoids  Facial Expression Flat  Affect Depressed  Speech Logical/coherent  Interaction Minimal  Motor Activity Slow  Appearance/Hygiene Unremarkable  Behavior Characteristics Cooperative;Appropriate to situation  Mood Depressed  Thought Process  Coherency WDL  Content WDL  Delusions None reported or observed  Perception WDL  Hallucination None reported or observed  Judgment Limited  Confusion None  Danger to Self  Current suicidal ideation? Denies  Agreement Not to Harm Self Yes  Description of Agreement verbal  Danger to Others  Danger to Others None reported or observed

## 2022-11-24 DIAGNOSIS — F1494 Cocaine use, unspecified with cocaine-induced mood disorder: Secondary | ICD-10-CM | POA: Diagnosis not present

## 2022-11-24 LAB — LIPID PANEL
Cholesterol: 139 mg/dL (ref 0–200)
HDL: 36 mg/dL — ABNORMAL LOW (ref >40)
LDL Cholesterol: 72 mg/dL (ref 0–99)
Total CHOL/HDL Ratio: 3.9 RATIO
Triglycerides: 156 mg/dL — ABNORMAL HIGH (ref <150)
VLDL: 31 mg/dL (ref 0–40)

## 2022-11-24 MED ORDER — BISACODYL 5 MG PO TBEC
10.0000 mg | DELAYED_RELEASE_TABLET | Freq: Once | ORAL | Status: AC
  Administered 2022-11-24: 10 mg via ORAL
  Filled 2022-11-24: qty 2

## 2022-11-24 MED ORDER — BISACODYL 10 MG RE SUPP
10.0000 mg | Freq: Once | RECTAL | Status: AC
  Administered 2022-11-24: 10 mg via RECTAL
  Filled 2022-11-24: qty 1

## 2022-11-24 MED ORDER — POLYETHYLENE GLYCOL 3350 17 G PO PACK
17.0000 g | PACK | Freq: Every day | ORAL | Status: DC
  Administered 2022-11-24 – 2022-11-25 (×2): 17 g via ORAL
  Filled 2022-11-24 (×4): qty 1

## 2022-11-24 NOTE — Group Note (Signed)
Recreation Therapy Group Note   Group Topic:Problem Solving  Group Date: 11/24/2022 Start Time: 1308 End Time: 1000 Facilitators: Laporchia Nakajima-McCall, LRT,CTRS Location: [address] Dayroom   Goal Area(s) Addresses:  Patient will effectively work with peer towards shared goal.  Patient will identify skills used to make activity successful.  Patient will identify how skills used during activity can be applied to reach post d/c goals.   Group Description: [employer]. In teams of 5-6, patients were given 11 craft pipe cleaners. Using the materials provided, patients were instructed to compete again the opposing team(s) to build the tallest free-standing structure from floor level. The activity was timed; difficulty increased by Clinical research associate as Production designer, theatre/television/film continued.  Systematically resources were removed with additional directions for example, placing one arm behind their back, working in silence, and shape stipulations. LRT facilitated post-activity discussion reviewing team processes and necessary communication skills involved in completion. Patients were encouraged to reflect how the skills utilized, or not utilized, in this activity can be incorporated to positively impact support systems post discharge.   Affect/Mood: Appropriate   Participation Level: Minimal   Participation Quality: Independent   Behavior: Attentive    Speech/Thought Process: Focused   Insight: Moderate   Judgement: Moderate   Modes of Intervention: Problem-solving   Patient Response to Interventions:  Attentive   Education Outcome:  In group clarification offered    Clinical Observations/Individualized Feedback: Pt was attentive and observant.  Pt had to be initiated by peers to help when they were in a tight spot.  Pt was very minimal in helping peers.    Plan: Continue to engage patient in RT group sessions 2-3x/week.   Leshaun Biebel-McCall, LRT,CTRS 11/24/2022 12:37 PM

## 2022-11-24 NOTE — Progress Notes (Signed)
Writer talked to pt about coping with situations in pt life like anger and other things that happen, pt appeared to listen.    11/24/22 2015  Psych Admission Type (Psych Patients Only)  Admission Status Voluntary  Psychosocial Assessment  Patient Complaints Anxiety;Irritability  Eye Contact Fair  Facial Expression Anxious;Animated  Affect Sad  Speech Logical/coherent  Interaction Assertive  Motor Activity Slow  Appearance/Hygiene Unremarkable  Behavior Characteristics Anxious  Mood Depressed  Aggressive Behavior  Effect No apparent injury  Thought Process  Coherency WDL  Content WDL  Delusions WDL  Perception WDL  Hallucination None reported or observed  Judgment WDL  Confusion None  Danger to Self  Current suicidal ideation? Denies  Danger to Others  Danger to Others None reported or observed

## 2022-11-24 NOTE — [facility] Counselor (Addendum)
[facility] LCSW Progress Note   11/24/2022    3:38 PM  Cara Clute      Type of Note: [facility] and [employer]    Patient completed his intake assessment with [facility] earlier today; afterwards CSW spoke with patient to see how his intake went and patient said it went good. Around 3:15 pm CSW reached out to [facility] and due to notes patient was declined but the lady could not pin point why exactly. Furthermore, CSW reached out to [employer] and [place] said that she would do patient intake assessment today at 4:30 PM , but based on his notes regarding his behavior and aggression she will explain the regulations and expectations there and if he cannot follow them he cannot come. Also, Jeanice Lim mentioned that in a note she seen something about patient has a no roommate order and said that he will have three other roommates there but will explain everything to him.     PATIENT WAS ACCEPTED TO [facility] !!! TRANSPORTATION HAS BEEN ARRANGED FOR MONDAY 11/27/2022 AROUND NOON   Signed:   Jacob Moores, MSW, [facility] 11/24/2022 3:38 PM

## 2022-11-24 NOTE — Progress Notes (Signed)
   11/24/22 1236  Psych Admission Type (Psych Patients Only)  Admission Status Voluntary  Psychosocial Assessment  Patient Complaints Isolation  Eye Contact Avoids  Facial Expression Flat  Affect Depressed  Speech Logical/coherent  Interaction Minimal  Motor Activity Slow  Appearance/Hygiene Unremarkable  Behavior Characteristics Irritable  Mood Depressed;Irritable  Thought Process  Coherency WDL  Content WDL  Delusions None reported or observed  Perception WDL  Hallucination None reported or observed  Judgment Limited  Confusion None  Danger to Self  Current suicidal ideation? Denies  Self-Injurious Behavior No self-injurious ideation or behavior indicators observed or expressed   Agreement Not to Harm Self Yes  Description of Agreement verbal  Danger to Others  Danger to Others None reported or observed
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
## 2022-11-25 DIAGNOSIS — F1994 Other psychoactive substance use, unspecified with psychoactive substance-induced mood disorder: Secondary | ICD-10-CM

## 2022-11-25 LAB — HIV ANTIBODY (ROUTINE TESTING W REFLEX): HIV Screen 4th Generation wRfx: NONREACTIVE

## 2022-11-25 MED ORDER — DIPHENHYDRAMINE HCL 50 MG PO CAPS
50.0000 mg | ORAL_CAPSULE | Freq: Every day | ORAL | Status: DC
  Administered 2022-11-25: 50 mg via ORAL
  Filled 2022-11-25 (×2): qty 1

## 2022-11-25 MED ORDER — MELATONIN 5 MG PO TABS
5.0000 mg | ORAL_TABLET | Freq: Every day | ORAL | Status: DC
  Administered 2022-11-25 – 2022-11-26 (×2): 5 mg via ORAL
  Filled 2022-11-25 (×4): qty 1

## 2022-11-25 NOTE — [facility] Group Notes (Signed)
[facility] Group Notes:  (Nursing/MHT/Case Management/Adjunct)  Date:  11/25/2022  Time:  9:32 PM  Type of Therapy:  Group Therapy  Participation Level:  Active  Participation Quality:  Appropriate  Affect:  Appropriate  Cognitive:  Appropriate  Insight:  Appropriate  Engagement in Group:  Engaged  Modes of Intervention:  Education  Summary of Progress/Problems: Goal to stay focused. Day 8/10. Participated in anger management exercise.   Daniel [name] 11/25/2022, 9:32 PM
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
## 2022-11-25 NOTE — Progress Notes (Signed)
   11/25/22 2000  Psych Admission Type (Psych Patients Only)  Admission Status Voluntary  Psychosocial Assessment  Eye Contact Fair  Facial Expression Anxious;Animated  Affect Sad  Speech Logical/coherent  Interaction Assertive  Motor Activity Slow  Appearance/Hygiene Unremarkable  Behavior Characteristics Anxious  Mood Depressed;Pleasant  Aggressive Behavior  Effect No apparent injury  Thought Process  Coherency WDL  Content WDL  Delusions WDL  Perception WDL  Hallucination None reported or observed  Judgment WDL  Confusion None  Danger to Self  Current suicidal ideation? Denies  Danger to Others  Danger to Others None reported or observed

## 2022-11-25 NOTE — Progress Notes (Signed)
   11/25/22 0933  Psych Admission Type (Psych Patients Only)  Admission Status Voluntary  Psychosocial Assessment  Patient Complaints Anxiety  Eye Contact Avoids  Facial Expression Anxious;Animated  Affect Sad  Speech Logical/coherent  Interaction Assertive  Motor Activity Slow  Appearance/Hygiene Unremarkable  Behavior Characteristics Anxious  Mood Depressed  Aggressive Behavior  Effect No apparent injury  Thought Process  Coherency WDL  Content WDL  Delusions WDL  Perception WDL  Hallucination None reported or observed  Judgment WDL  Confusion None  Danger to Self  Current suicidal ideation? Denies  Self-Injurious Behavior No self-injurious ideation or behavior indicators observed or expressed   Agreement Not to Harm Self Yes  Description of Agreement verbal  Danger to Others  Danger to Others None reported or observed

## 2022-11-26 LAB — RPR: RPR Ser Ql: NONREACTIVE

## 2022-11-26 MED ORDER — FLEET ENEMA 7-19 GM/118ML RE ENEM
1.0000 | ENEMA | Freq: Once | RECTAL | Status: AC
  Administered 2022-11-26: 1 via RECTAL
  Filled 2022-11-26: qty 1

## 2022-11-26 MED ORDER — POLYETHYLENE GLYCOL 3350 17 G PO PACK: 17.0000 g | PACK | Freq: Every day | ORAL | Status: DC | PRN

## 2022-11-26 MED ORDER — DIPHENHYDRAMINE HCL 25 MG PO CAPS
50.0000 mg | ORAL_CAPSULE | Freq: Every evening | ORAL | Status: DC | PRN
  Administered 2022-11-26: 50 mg via ORAL
  Filled 2022-11-26: qty 2

## 2022-11-26 NOTE — Progress Notes (Signed)
   11/26/22 2000  Psych Admission Type (Psych Patients Only)  Admission Status Voluntary  Psychosocial Assessment  Patient Complaints None  Eye Contact Fair  Facial Expression Anxious;Animated  Affect Sad  Speech Logical/coherent  Interaction Assertive  Motor Activity Slow  Appearance/Hygiene Unremarkable  Behavior Characteristics Anxious  Mood Anxious;Depressed  Aggressive Behavior  Effect No apparent injury  Thought Process  Coherency WDL  Content WDL  Delusions WDL  Perception WDL  Hallucination None reported or observed  Judgment WDL  Confusion None  Danger to Self  Current suicidal ideation? Denies  Danger to Others  Danger to Others None reported or observed

## 2022-11-26 NOTE — Progress Notes (Signed)
   11/26/22 0800  Psych Admission Type (Psych Patients Only)  Admission Status Voluntary  Psychosocial Assessment  Patient Complaints Anhedonia  Eye Contact Fair  Facial Expression Anxious;Animated  Affect Anxious;Irritable;Blunted  Speech Logical/coherent  Interaction Assertive  Motor Activity Slow  Appearance/Hygiene Unremarkable  Behavior Characteristics Anxious  Mood Depressed;Anxious  Aggressive Behavior  Effect No apparent injury  Thought Process  Coherency WDL  Content WDL  Delusions WDL  Perception WDL  Hallucination None reported or observed  Judgment WDL  Confusion None  Danger to Self  Current suicidal ideation? Denies  Self-Injurious Behavior No self-injurious ideation or behavior indicators observed or expressed   Agreement Not to Harm Self Yes  Description of Agreement Patient agreed to safety plan  Danger to Others  Danger to Others None reported or observed
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
## 2022-11-26 NOTE — Plan of Care (Signed)
  Problem: Education: Goal: Knowledge of [facility] Education information/materials will improve Outcome: Progressing Goal: Emotional status will improve Outcome: Progressing Goal: Mental status will improve Outcome: Progressing Goal: Verbalization of understanding the information provided will improve Outcome: Progressing   Problem: Activity: Goal: Interest or engagement in activities will improve Outcome: Progressing Goal: Sleeping patterns will improve Outcome: Progressing   Problem: Coping: Goal: Ability to verbalize frustrations and anger appropriately will improve Outcome: Progressing Goal: Ability to demonstrate self-control will improve Outcome: Progressing   

## 2022-11-27 ENCOUNTER — Encounter (HOSPITAL_COMMUNITY): Payer: Self-pay

## 2022-11-27 LAB — GC/CHLAMYDIA PROBE AMP (~~LOC~~) NOT AT ARMC
Chlamydia: NEGATIVE
Comment: NEGATIVE
Comment: NORMAL
Neisseria Gonorrhea: NEGATIVE

## 2022-11-27 MED ORDER — QUETIAPINE FUMARATE 100 MG PO TABS: 100.0000 mg | ORAL_TABLET | ORAL | 0 refills | Status: AC

## 2022-11-27 MED ORDER — DULOXETINE HCL 30 MG PO CPEP: 30.0000 mg | ORAL_CAPSULE | Freq: Every day | ORAL | 0 refills | Status: AC

## 2022-11-27 MED ORDER — QUETIAPINE FUMARATE 400 MG PO TABS: 400.0000 mg | ORAL_TABLET | Freq: Every day | ORAL | 0 refills | Status: AC

## 2022-11-27 NOTE — Progress Notes (Signed)
  [facility] Adult Case Management Discharge Plan :  Will you be returning to the same living situation after discharge:  No.Patient will be DC to [facility]  At discharge, do you have transportation home?: Yes,  [facility] will be picking patient up around Noon  Do you have the ability to pay for your medications: Yes,  Pt has a Community education officer   Release of information consent forms completed and in the chart;  Patient's signature needed at discharge.  Patient to Follow up at:  Follow-up Information     [facility]. Call.   Specialty: Urgent Care Why: Although you have refused follow up appointments, if you need a provider to see for your Mental Health needs , please give this provider a call or walk-in Monday-Friday , arrive at 7:20 to get a FASTER service to obtain a therapy or medication mgmt appointment. Please follow up after you complete your residential recovery program. Contact information: [phone] [place] [address] [phone]        [employer] Go on 11/27/2022.   Why: You were accepted to this residential and they will pick you up from [facility] today around [place]. Contact information: [address] [place] [address] [phone]                 Next level of care provider has access to [facility] Link:yes  Safety Planning and Suicide Prevention discussed: No.Patient declined     Has patient been referred to the Quitline?: Patient refused referral  Patient has been referred for addiction treatment: Yes; pt was accepted to [facility]   Isabella Bowens, LCSWA 11/27/2022, 9:39 AM

## 2022-11-27 NOTE — BH IP Treatment Plan (Signed)
Interdisciplinary Treatment and Diagnostic Plan Update  11/27/2022 Time of Session: 8:30am, update Daniel [name] Every MRN: [id number]  Principal Diagnosis: Psychoactive substance-induced mood disorder  Secondary Diagnoses: Principal Problem:   Psychoactive substance-induced mood disorder Active Problems:   Substance induced mood disorder   Current Medications:  Current Facility-Administered Medications  Medication Dose Route Frequency Provider Last Rate Last Admin   alum & mag hydroxide-simeth (MAALOX/MYLANTA) 200-200-20 MG/5ML suspension 30 mL  30 mL Oral Q4H PRN Onuoha, [name], NP       diphenhydrAMINE (BENADRYL) capsule 50 mg  50 mg Oral TID PRN Dahlia Byes C, NP       Or   diphenhydrAMINE (BENADRYL) injection 50 mg  50 mg Intramuscular TID PRN Dahlia Byes C, NP       diphenhydrAMINE (BENADRYL) capsule 50 mg  50 mg Oral QHS PRN Lamar Sprinkles, MD   50 mg at 11/26/22 2110   DULoxetine (CYMBALTA) DR capsule 30 mg  30 mg Oral Daily Ntuen, [name], FNP   30 mg at 11/27/22 4098   feeding supplement (BOOST / RESOURCE BREEZE) liquid 1 Container  1 Container Oral TID BM Massengill, Harrold Donath, MD   1 Container at 11/26/22 2112   haloperidol (HALDOL) tablet 5 mg  5 mg Oral TID PRN Dahlia Byes C, NP   5 mg at 11/26/22 0820   Or   haloperidol lactate (HALDOL) injection 5 mg  5 mg Intramuscular TID PRN Earney Navy, NP       hydrOXYzine (ATARAX) tablet 25 mg  25 mg Oral TID PRN Dahlia Byes C, NP   25 mg at 11/26/22 0820   ibuprofen (ADVIL) tablet 600 mg  600 mg Oral Q6H PRN Dahlia Byes C, NP       LORazepam (ATIVAN) tablet 2 mg  2 mg Oral TID PRN Dahlia Byes C, NP       Or   LORazepam (ATIVAN) injection 2 mg  2 mg Intramuscular TID PRN Dahlia Byes C, NP       magnesium hydroxide (MILK OF MAGNESIA) suspension 30 mL  30 mL Oral Daily PRN Dahlia Byes C, NP   30 mL at 11/23/22 1814   melatonin tablet 5 mg  5 mg Oral QHS Lamar Sprinkles, MD    5 mg at 11/26/22 2110   polyethylene glycol (MIRALAX / GLYCOLAX) packet 17 g  17 g Oral Daily PRN Lamar Sprinkles, MD       QUEtiapine (SEROQUEL) tablet 100 mg  100 mg Oral BH-q7a Ntuen, Tina C, FNP   100 mg at 11/27/22 1191   QUEtiapine (SEROQUEL) tablet 400 mg  400 mg Oral QHS Massengill, Harrold Donath, MD   400 mg at 11/26/22 2110   PTA Medications: Medications Prior to Admission  Medication Sig Dispense Refill Last Dose   DULoxetine (CYMBALTA) 30 MG capsule Take 1 capsule (30 mg total) by mouth daily. 14 capsule 0    QUEtiapine (SEROQUEL) 200 MG tablet Take 1 tablet (200 mg total) by mouth at bedtime. 14 tablet 0     Patient Stressors:    Patient Strengths:    Treatment Modalities: Medication Management, Group therapy, Case management,  1 to 1 session with clinician, Psychoeducation, Recreational therapy.   Physician Treatment Plan for Primary Diagnosis: Psychoactive substance-induced mood disorder Long Term Goal(s): Improvement in symptoms so as ready for discharge   Short Term Goals: Ability to identify changes in lifestyle to reduce recurrence of condition will improve Ability to verbalize feelings will improve Ability to disclose and  discuss suicidal ideas Ability to demonstrate self-control will improve Ability to identify and develop effective coping behaviors will improve Ability to maintain clinical measurements within normal limits will improve Compliance with prescribed medications will improve Ability to identify triggers associated with substance abuse/mental health issues will improve  Medication Management: Evaluate patient's response, side effects, and tolerance of medication regimen.  Therapeutic Interventions: 1 to 1 sessions, Unit Group sessions and Medication administration.  Evaluation of Outcomes: Progressing  Physician Treatment Plan for Secondary Diagnosis: Principal Problem:   Psychoactive substance-induced mood disorder Active Problems:   Substance  induced mood disorder  Long Term Goal(s): Improvement in symptoms so as ready for discharge   Short Term Goals: Ability to identify changes in lifestyle to reduce recurrence of condition will improve Ability to verbalize feelings will improve Ability to disclose and discuss suicidal ideas Ability to demonstrate self-control will improve Ability to identify and develop effective coping behaviors will improve Ability to maintain clinical measurements within normal limits will improve Compliance with prescribed medications will improve Ability to identify triggers associated with substance abuse/mental health issues will improve     Medication Management: Evaluate patient's response, side effects, and tolerance of medication regimen.  Therapeutic Interventions: 1 to 1 sessions, Unit Group sessions and Medication administration.  Evaluation of Outcomes: Progressing   RN Treatment Plan for Primary Diagnosis: Psychoactive substance-induced mood disorder Long Term Goal(s): Knowledge of disease and therapeutic regimen to maintain health will improve  Short Term Goals: Ability to remain free from injury will improve, Ability to verbalize frustration and anger appropriately will improve, Ability to demonstrate self-control, Ability to participate in decision making will improve, Ability to verbalize feelings will improve, Ability to disclose and discuss suicidal ideas, Ability to identify and develop effective coping behaviors will improve, and Compliance with prescribed medications will improve  Medication Management: RN will administer medications as ordered by provider, will assess and evaluate patient's response and provide education to patient for prescribed medication. RN will report any adverse and/or side effects to prescribing provider.  Therapeutic Interventions: 1 on 1 counseling sessions, Psychoeducation, Medication administration, Evaluate responses to treatment, Monitor vital signs and  CBGs as ordered, Perform/monitor CIWA, COWS, AIMS and Fall Risk screenings as ordered, Perform wound care treatments as ordered.  Evaluation of Outcomes: Progressing   LCSW Treatment Plan for Primary Diagnosis: Psychoactive substance-induced mood disorder Long Term Goal(s): Safe transition to appropriate next level of care at discharge, Engage patient in therapeutic group addressing interpersonal concerns.  Short Term Goals: Engage patient in aftercare planning with referrals and resources, Increase social support, Increase ability to appropriately verbalize feelings, Increase emotional regulation, Facilitate acceptance of mental health diagnosis and concerns, Facilitate patient progression through stages of change regarding substance use diagnoses and concerns, Identify triggers associated with mental health/substance abuse issues, and Increase skills for wellness and recovery  Therapeutic Interventions: Assess for all discharge needs, 1 to 1 time with Social worker, Explore available resources and support systems, Assess for adequacy in community support network, Educate family and significant other(s) on suicide prevention, Complete Psychosocial Assessment, Interpersonal group therapy.  Evaluation of Outcomes: Progressing   Progress in Treatment: Attending groups: Yes. Participating in groups: Yes. Taking medication as prescribed: Yes. Toleration medication: Yes. Family/Significant other contact made: No, will contact:  patient declined consents  Patient understands diagnosis: Yes. Discussing patient identified problems/goals with staff: Yes. Medical problems stabilized or resolved: Yes. Denies suicidal/homicidal ideation: Yes. Issues/concerns per patient self-inventory: No.   New problem(s) identified: No, Describe:  none  reported   New Short Term/Long Term Goal(s):   medication stabilization, elimination of SI thoughts, development of comprehensive mental wellness plan.     Patient Goals:  Patient continues to work on initial tx team goals   Discharge Plan or Barriers: Patient is going to [facility] for ongoing substance use rehab  Reason for Continuation of Hospitalization: Anxiety Depression Medication stabilization Suicidal ideation Withdrawal symptoms  Estimated Length of Stay: 1-3 days  Last 3 [place] Suicide Severity Risk Score: Flowsheet Row Ad[facility] (Current) [facility]19/2022 in [facility] at [facility] ED from 10/23/2022 in [facility] Emergency Department [facility]  C-SSRS RISK CATEGORY High Risk High Risk High Risk       Last [facility] 2/9 Scores:    05/27/2020    3:39 PM 03/26/2020    3:10 PM  Depression screen PHQ 2/9  Decreased Interest 3 3  Down, Depressed, Hopeless 3 3  PHQ - 2 Score 6 6  Altered sleeping 3 1  Tired, decreased energy 3 3  Change in appetite 3 3  Feeling bad or failure about yourself  2 3  Trouble concentrating 3 3  Moving slowly or fidgety/restless 3 1  Suicidal thoughts 3 0  PHQ-9 Score 26 20  Difficult doing work/chores  Extremely dIfficult    Scribe for Treatment Team: Beatris Si, LCSW 11/27/2022 10:27 AM
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
## 2022-11-27 NOTE — [facility] Suicide Risk Assessment (Addendum)
Suicide Risk Assessment  Discharge Assessment    [facility] Discharge Suicide Risk Assessment   Principal Problem: Psychoactive substance-induced mood disorder Discharge Diagnoses: Principal Problem:   Psychoactive substance-induced mood disorder Active Problems:   Substance induced mood disorder   During the patient's hospitalization, patient had extensive initial psychiatric evaluation, and follow-up psychiatric evaluations every day.  Psychiatric diagnoses provided upon initial assessment:  Psychoactive substance-induced mood disorder  Patient's psychiatric medications were adjusted on admission: His Cymbalta and Seroquel were restarted.  During the hospitalization, other adjustments were made to the patient's psychiatric medication regimen: His Seroquel was titrated up and he responded well to it.  Gradually, patient started adjusting to milieu.   Patient's care was discussed during the interdisciplinary team meeting every day during the hospitalization.  The patient no having side effects to prescribed psychiatric medication.  The patient reports their target psychiatric symptoms of depression, SI, and psychosis responded well to the psychiatric medications, and the patient reports overall benefit other psychiatric hospitalization. Supportive psychotherapy was provided to the patient. The patient also participated in regular group therapy while admitted.   Labs were reviewed with the patient, and abnormal results were discussed with the patient.  The patient denied having suicidal thoughts more than 48 hours prior to discharge.  Patient denies having homicidal thoughts.  Patient denies having auditory hallucinations.  Patient denies any visual hallucinations.  Patient denies having paranoid thoughts.  The patient is able to verbalize their individual safety plan to this provider.  It is recommended to the patient to continue psychiatric medications as prescribed, after discharge from  the hospital.    It is recommended to the patient to follow up with your outpatient psychiatric provider and PCP.  Discussed with the patient, the impact of alcohol, drugs, tobacco have been there overall psychiatric and medical wellbeing, and total abstinence from substance use was recommended the patient.   Total Time spent with patient: 20 minutes  Musculoskeletal: Strength & Muscle Tone: within normal limits Gait & Station: normal Patient leans: N/A  Psychiatric Specialty Exam  Presentation  General Appearance:  Appropriate for Environment; Casual  Eye Contact: Good  Speech: Clear and Coherent; Normal Rate  Speech Volume: Normal  Handedness: Right   Mood and Affect  Mood: -- ("ok")  Duration of Depression Symptoms: No data recorded Affect: Appropriate; Congruent   Thought Process  Thought Processes: Coherent; Goal Directed  Descriptions of Associations:Intact  Orientation:Full (Time, Place and Person)  Thought Content:Logical; WDL  History of Schizophrenia/Schizoaffective disorder:No data recorded Duration of Psychotic Symptoms:No data recorded Hallucinations:Hallucinations: None  Ideas of Reference:None  Suicidal Thoughts:Suicidal Thoughts: No  Homicidal Thoughts:Homicidal Thoughts: No   Sensorium  Memory: Immediate Good; Recent Good  Judgment: Good  Insight: Good   Executive Functions  Concentration: Good  Attention Span: Good  Recall: Good  Fund of Knowledge: Good  Language: Good   Psychomotor Activity  Psychomotor Activity: Psychomotor Activity: Normal   Assets  Assets: Communication Skills; Desire for Improvement; Financial Resources/Insurance; Resilience   Sleep  Sleep: Sleep: Good   Physical Exam: Physical Exam Vitals and nursing note reviewed.  Constitutional:      General: He is not in acute distress.    Appearance: Normal appearance. He is normal weight. He is not ill-appearing or  toxic-appearing.  HENT:     Head: Normocephalic and atraumatic.  Pulmonary:     Effort: Pulmonary effort is normal.  Musculoskeletal:        General: Normal range of motion.  Neurological:  General: No focal deficit present.     Mental Status: He is alert.    Review of Systems  Respiratory:  Negative for cough and shortness of breath.   Cardiovascular:  Negative for chest pain.  Gastrointestinal:  Negative for abdominal pain, constipation, diarrhea, nausea and vomiting.  Neurological:  Negative for dizziness, weakness and headaches.  Psychiatric/Behavioral:  Negative for depression, hallucinations and suicidal ideas. The patient is not nervous/anxious.    Blood pressure (!) 136/95, pulse 89, temperature 98.4 F (36.9 C), temperature source Oral, resp. rate 16, height  (1.727 m), weight 76.2 kg, SpO2 99 %. Body mass index is 25.54 kg/m.  Mental Status Per Nursing Assessment::   On Admission:  Suicidal ideation indicated by patient  Demographic Factors:  Male, Low socioeconomic status, and Unemployed  Loss Factors: Financial problems/change in socioeconomic status  Historical Factors: NA  Risk Reduction Factors:   Responsible for children under 107 years of age, Sense of responsibility to family, and Religious beliefs about death  Continued Clinical Symptoms:  Alcohol/Substance Abuse/Dependencies  Cognitive Features That Contribute To Risk:  None    Suicide Risk:  Minimal: No identifiable suicidal ideation.  Patients presenting with no risk factors but with morbid ruminations; may be classified as minimal risk based on the severity of the depressive symptoms   Follow-up Information     [facility]. Call.   Specialty: Urgent Care Why: Although you have refused follow up appointments, if you need a provider to see for your Mental Health needs , please give this provider a call or walk-in Monday-Friday , arrive at 7:20 to get a FASTER  service to obtain a therapy or medication mgmt appointment. Please follow up after you complete your residential recovery program. Contact information: [address] [place] [address] [phone]        [employer] Go on 11/27/2022.   Why: You were accepted to this residential and they will pick you up from [facility] today around [place]. Contact information: [address] [place] [address] [phone]                 Plan Of Care/Follow-up recommendations:  Activity: as tolerated  Diet: heart healthy  Other: -Follow-up with your outpatient psychiatric provider -instructions on appointment date, time, and address (location) are provided to you in discharge paperwork.  -Take your psychiatric medications as prescribed at discharge - instructions are provided to you in the discharge paperwork  -Follow-up with outpatient primary care doctor and other specialists -for management of chronic medical disease, including: Abstinence from substances, Mildly elevated Triglycerides  -Testing: Follow-up with outpatient provider for abnormal lab results: Mildly elevated Triglycerides  -Recommend abstinence from alcohol, tobacco, and other illicit drug use at discharge.   -If your psychiatric symptoms recur, worsen, or if you have side effects to your psychiatric medications, call your outpatient psychiatric provider, 911, [phone] or go to the nearest emergency department.  -If suicidal thoughts recur, call your outpatient psychiatric provider, 911, [phone] or go to the nearest emergency department.   Lauro Franklin, MD 11/27/2022, 9:58 AM

## 2022-11-27 NOTE — Progress Notes (Signed)
Pt discharged at 1045. Pt left with all belongings and prescriptions for medications. Pt denied SI/HI/AVH at time of discharge. No further questions/concerns voiced following discharge education.
